# Patient Record
Sex: Male | Born: 1960 | Race: Black or African American | Hispanic: No | Marital: Married | Smoking: Never smoker
Health system: Southern US, Community
[De-identification: ages and names within clinical notes are randomized; demographics above are authoritative.]

## PROBLEM LIST (undated history)

## (undated) DIAGNOSIS — F22 Delusional disorders: Secondary | ICD-10-CM

## (undated) DIAGNOSIS — M549 Dorsalgia, unspecified: Secondary | ICD-10-CM

## (undated) DIAGNOSIS — J45909 Unspecified asthma, uncomplicated: Secondary | ICD-10-CM

## (undated) HISTORY — PX: CHOLECYSTECTOMY: SHX55

## (undated) MED ORDER — AMLODIPINE 5 MG TAB
5 mg | ORAL_TABLET | Freq: Every day | ORAL | Status: DC
Start: ? — End: 2013-07-25

## (undated) MED ORDER — ALBUTEROL SULFATE HFA 90 MCG/ACTUATION AEROSOL INHALER
90 mcg/actuation | RESPIRATORY_TRACT | Status: DC | PRN
Start: ? — End: 2013-07-25

## (undated) MED ORDER — FLUPHENAZINE 10 MG TAB
10 mg | ORAL_TABLET | Freq: Two times a day (BID) | ORAL | Status: DC
Start: ? — End: 2013-07-25

---

## 2006-06-02 ENCOUNTER — Ambulatory Visit (HOSPITAL_COMMUNITY): Admission: RE | Admit: 2006-06-02 | Discharge: 2006-06-02 | Payer: Self-pay | Admitting: Internal Medicine

## 2007-10-30 ENCOUNTER — Ambulatory Visit (HOSPITAL_COMMUNITY): Admission: RE | Admit: 2007-10-30 | Discharge: 2007-10-30 | Payer: Self-pay | Admitting: Internal Medicine

## 2007-12-25 ENCOUNTER — Ambulatory Visit: Payer: Self-pay | Admitting: Cardiology

## 2010-02-06 IMAGING — CT CT PELVIS W/O CM
2 of 4 series · 17 of 46 positions shown, 19 images · non-contrast
Comparison: None

CT ABDOMEN

CLINICAL DATA: Epigastric pain, elevated bilirubin, nausea

CT ABDOMEN AND PELVIS WITHOUT CONTRAST
TECHNIQUE: Multidetector CT imaging of the abdomen and pelvis was
performed following the standard
protocol without intravenous contrast.

[Series 3: routine abdomen · axial · 0.98mm/px · z∈[-524,-19]mm · 14 of 111 slices shown, 16 images]
[im 5/111  soft-tissue]
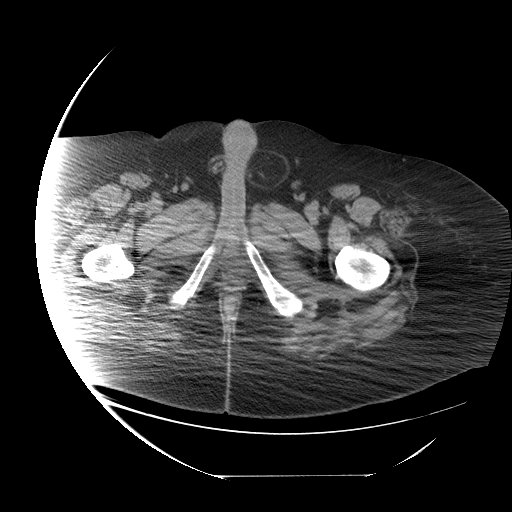
[im 5/111  bone]
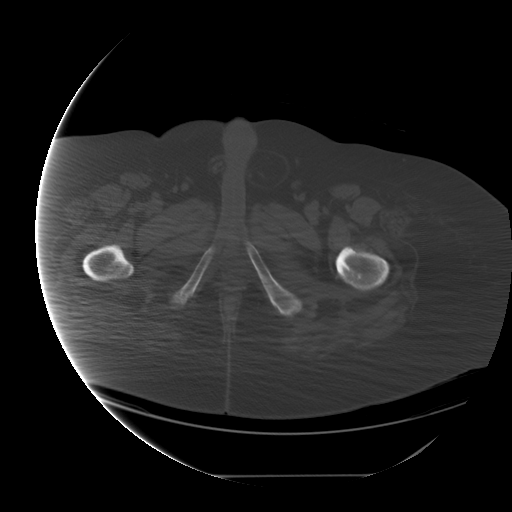
[im 13/111  soft-tissue]
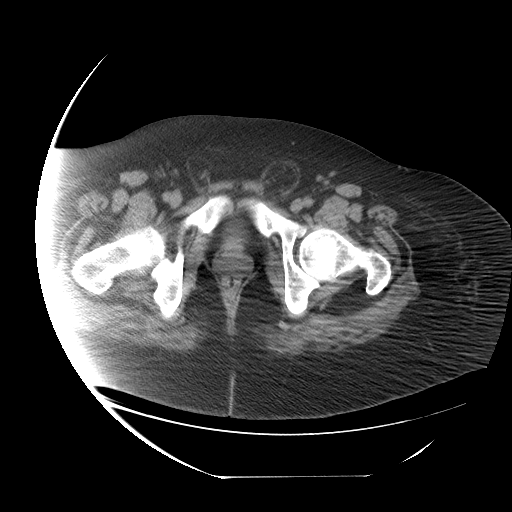
[im 22/111  soft-tissue]
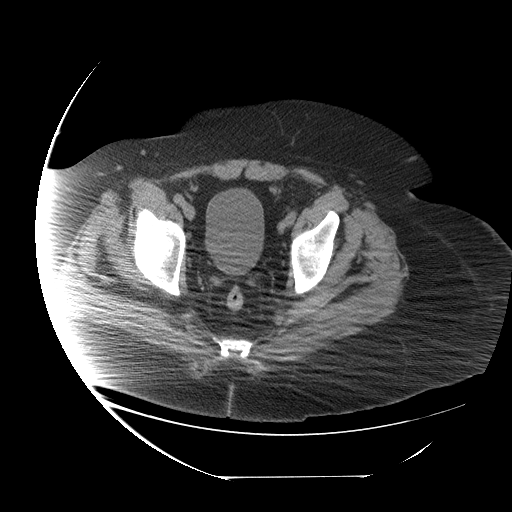
[im 30/111  soft-tissue]
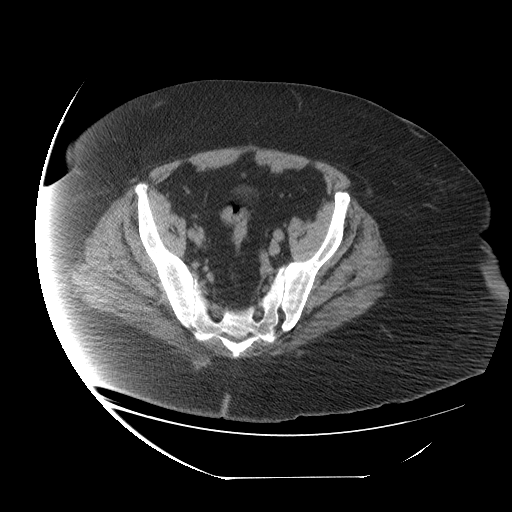
[im 39/111  soft-tissue]
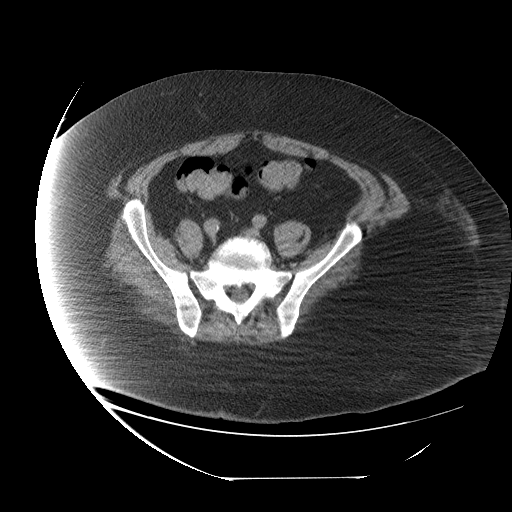
[im 43/111  soft-tissue]
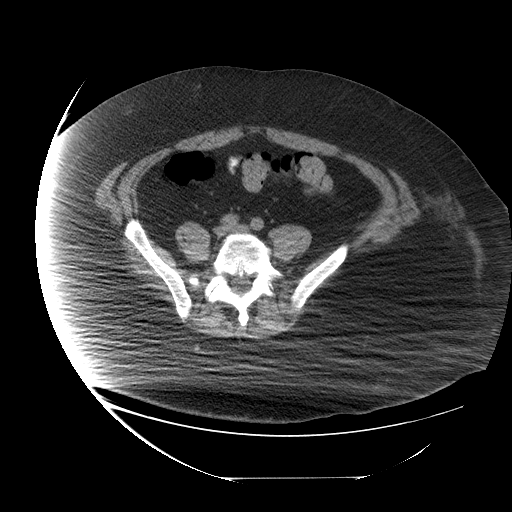
[im 51/111  soft-tissue]
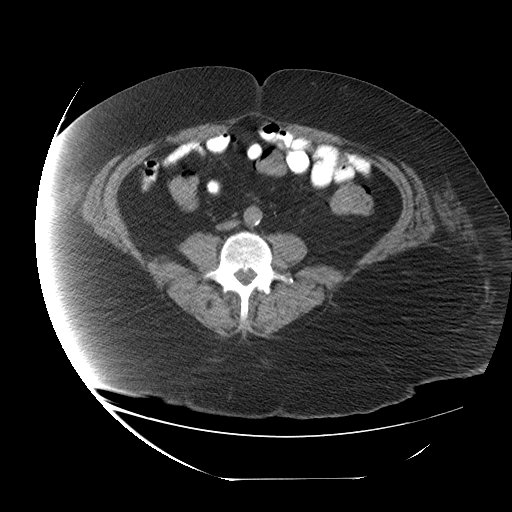
[im 60/111  soft-tissue]
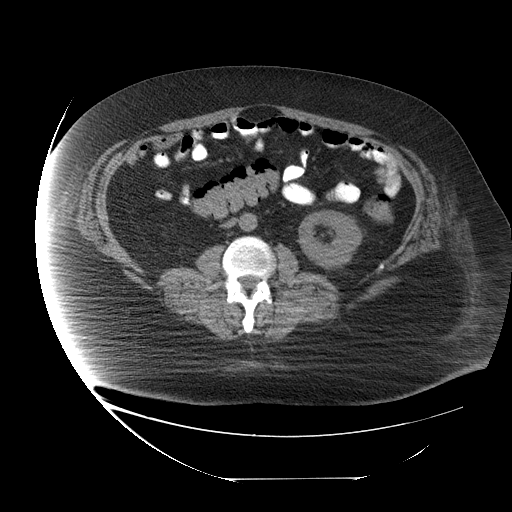
[im 68/111  soft-tissue]
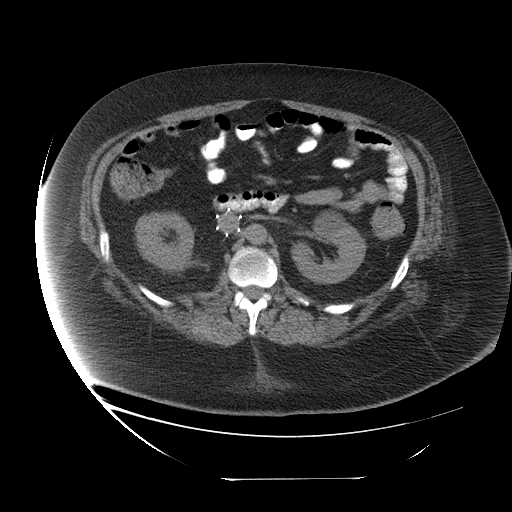
[im 68/111  bone]
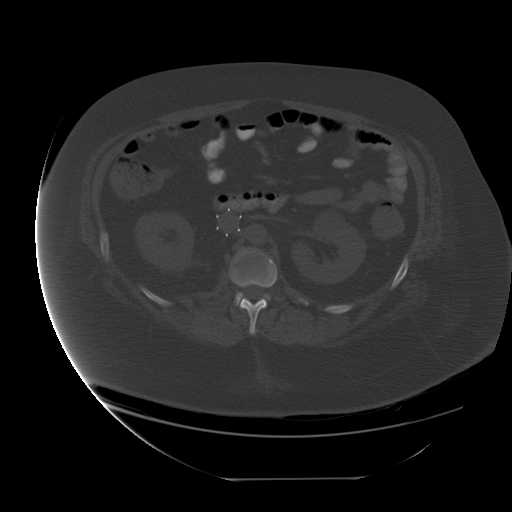
[im 72/111  soft-tissue]
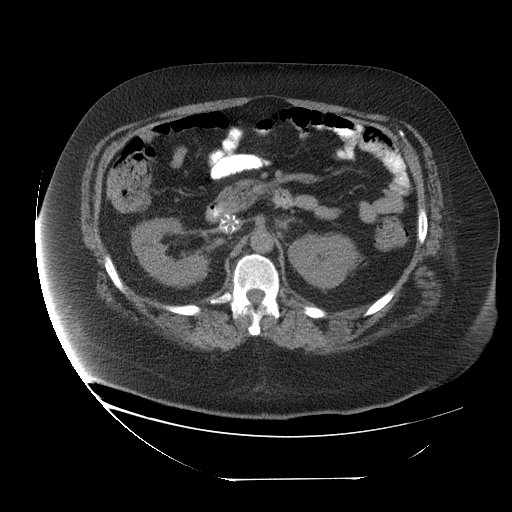
[im 81/111  soft-tissue]
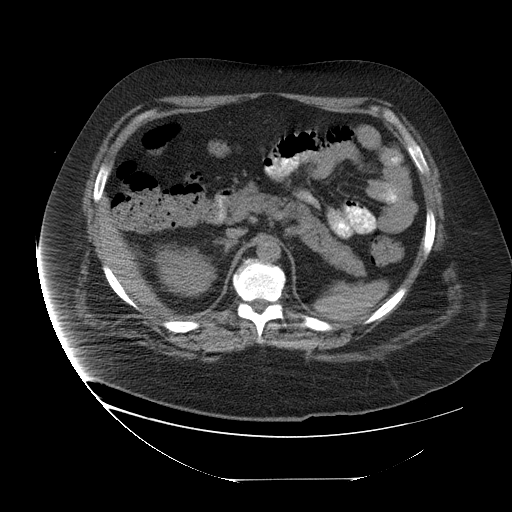
[im 89/111  soft-tissue]
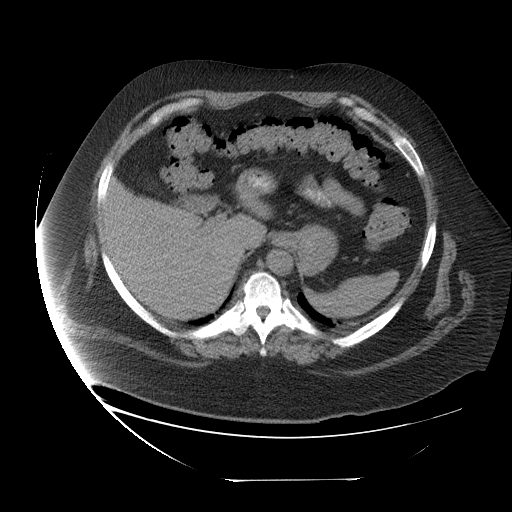
[im 98/111  soft-tissue]
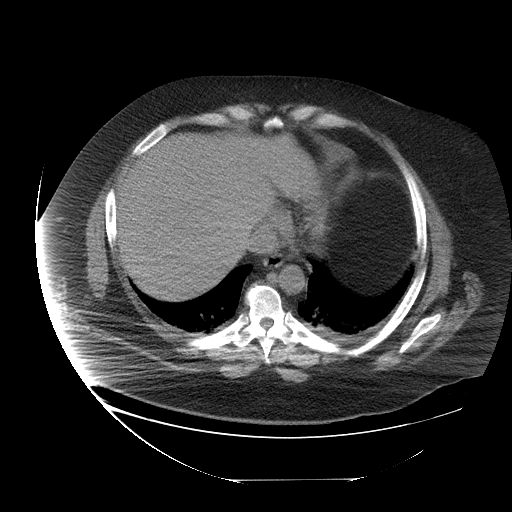
[im 106/111  soft-tissue]
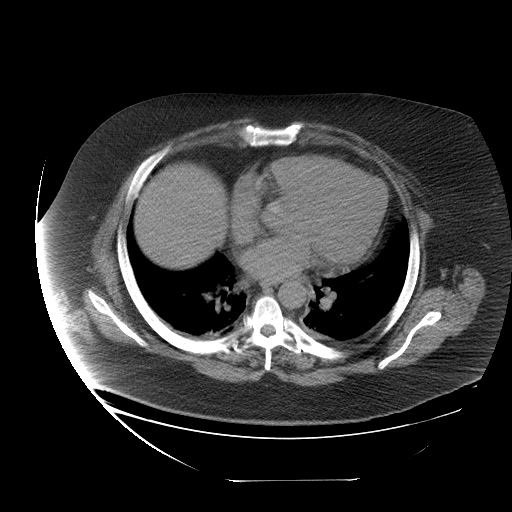

[Series 501: coronal · coronal · 0.90mm/px · 3 of 108 slices shown]
[im 36/108  soft-tissue]
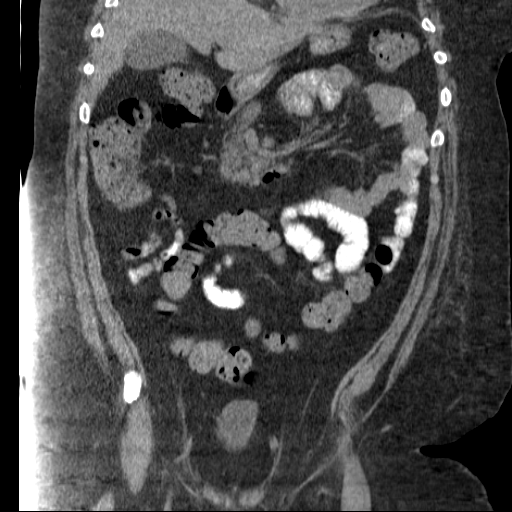
[im 48/108  soft-tissue]
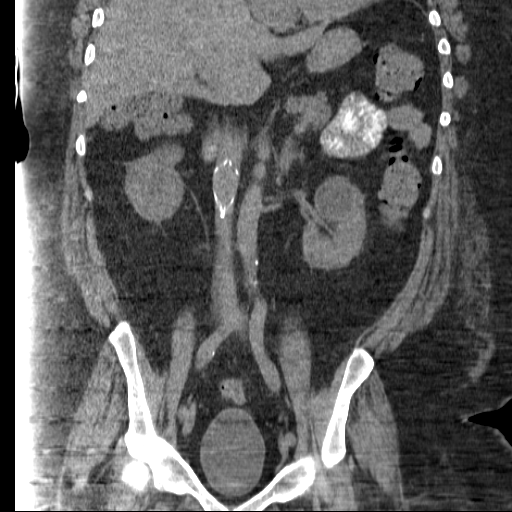
[im 60/108  soft-tissue]
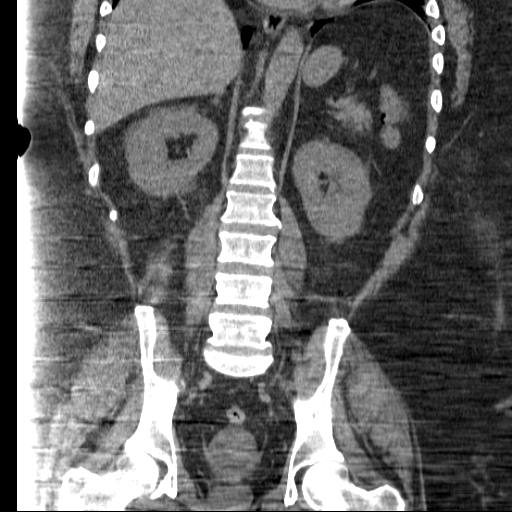

[17 of 46 positions shown; findings below may reference images not displayed]

FINDINGS: There is cardiomegaly.  Vascular congestion and
bibasilar atelectasis noted.

Liver unremarkable.  Gallbladder grossly unremarkable.  No biliary
ductal dilatation.  Pancreas, spleen, adrenals, right kidney have
an unremarkable unenhanced appearance.  3.6 cm low density area in
the mid pole of the left kidney.  This cannot be be characterized
without IV contrast.  IVC filter is in the infrarenal IVC.

Moderate stool throughout the colon.  Otherwise bowel grossly
unremarkable.  No free fluid, free air, or adenopathy.
IMPRESSION: No evidence of liver abnormality on this unenhanced scan.  No
biliary ductal dilatation or evidence of gallbladder disease.

3.6 cm low density lesion in the left kidney, cannot be
characterized without IV contrast.  This can be further evaluated
with ultrasound.

IVC filter in place.

Cardiomegaly with vascular congestion and bibasilar atelectasis.

CT PELVIS
FINDINGS: Bilateral inguinal hernias present, containing fat.
Bowel grossly unremarkable.  No free fluid, free air, or
adenopathy. Urinary bladder grossly unremarkable.

No acute bony abnormality.
IMPRESSION: No acute findings in the pelvis.

## 2010-05-22 ENCOUNTER — Encounter: Payer: Self-pay | Admitting: Internal Medicine

## 2012-10-19 ENCOUNTER — Inpatient Hospital Stay
Admit: 2012-10-19 | Discharge: 2012-10-29 | Disposition: A | Discharge status: Home / Self Care | Payer: Medicare (Managed Care) | Attending: Psychiatry | Admitting: Psychiatry

## 2012-10-19 DIAGNOSIS — F209 Schizophrenia, unspecified: Secondary | ICD-10-CM

## 2012-10-19 LAB — POC CHEM8
Anion gap (POC): 17 mmol/L — ABNORMAL HIGH (ref 5–15)
BUN (POC): 5 MG/DL — ABNORMAL LOW (ref 9–20)
CO2 (POC): 27 MMOL/L (ref 21–32)
Calcium, ionized (POC): 1.16 MMOL/L (ref 1.12–1.32)
Chloride (POC): 101 MMOL/L (ref 98–107)
Creatinine (POC): 0.8 MG/DL (ref 0.6–1.3)
GFRAA, POC: >60 mL/min/{1.73_m2} (ref >60)
GFRNA, POC: >60 mL/min/{1.73_m2} (ref >60)
Glucose (POC): 111 MG/DL — ABNORMAL HIGH (ref 75–110)
Hematocrit (POC): 44 % (ref 36.6–50.3)
Hemoglobin (POC): 15 GM/DL (ref 12.1–17.0)
Potassium (POC): 3.6 MMOL/L (ref 3.5–5.1)
Sodium (POC): 140 MMOL/L (ref 136–145)

## 2012-10-19 LAB — DRUG SCREEN, URINE
AMPHETAMINES: NEGATIVE
BARBITURATES: NEGATIVE
BENZODIAZEPINES: NEGATIVE
COCAINE: NEGATIVE
METHADONE: NEGATIVE
OPIATES: NEGATIVE
PCP(PHENCYCLIDINE): NEGATIVE
THC (TH-CANNABINOL): NEGATIVE

## 2012-10-19 LAB — ETHYL ALCOHOL: ALCOHOL(ETHYL),SERUM: NOT DETECTED MG/DL (ref <10)

## 2012-10-19 MED FILL — VENTOLIN HFA 90 MCG/ACTUATION AEROSOL INHALER: 90 mcg/actuation | RESPIRATORY_TRACT | Qty: 8

## 2012-10-19 NOTE — Behavioral Health Treatment Team (Signed)
GROUP THERAPY PROGRESS NOTE    Raliegh Brandi is participating in Community.     Group time: 30 minutes    Personal goal for participation: daily orientation    Goal orientation: personal    Group therapy participation: minimal    Therapeutic interventions reviewed and discussed: yes    Impression of participation: needed prompting

## 2012-10-19 NOTE — ED Notes (Signed)
TRANSFER - OUT REPORT:    Verbal report given to C. Leonie Man, RN on [employer]  being transferred to Psych for routine progression of care.      Report consisted of patient???s Situation, Background, Assessment and   Recommendations(SBAR).     Information from the following report(s) SBAR, ED Summary and Recent Results was reviewed with the receiving nurse.    Opportunity for questions and clarification was provided.      Patient's placed in a gown and his belongings placed in a bag.  Security made aware the patient is ready for transport to 311.  -patient transported to the floor in no apparent distress

## 2012-10-19 NOTE — H&P (Signed)
Patient seen, chart reviewed, staffing held and dictated report done. Please see dictated report for complete details.

## 2012-10-19 NOTE — Behavioral Health Treatment Team (Signed)
GROUP THERAPY PROGRESS NOTE    Antonio Potter is participating in Goals group.     Group time: 30 minutes    Personal goal for participation: to orient daily goal.    Goal orientation: personal    Group therapy participation: active    Therapeutic interventions reviewed and discussed: yes    Impression of participation: no problem
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
## 2012-10-19 NOTE — Progress Notes (Signed)
Problem: Altered Thought Process (Adult/Pediatric)  Goal: *STG: Remains safe in hospital  Outcome: Progressing Towards Goal  Patient is alert and oriented.  He has been out on the unit watching TV and interacting with peers.  Patient appears to be quite bizarre.  His mood: constricted.  His conversation is way off of topics and senseless.  He appears to be suspicious and guarded towards staff.  He denies SI/HI/AH.   He has refused to take blood pressure medications denying that he has high blood pressure because he "feel fine."  With much encouragement from staff he still refuses.  No prns given thus far.  Staff will continue to monitor patient Q15 minute checks.

## 2012-10-19 NOTE — Progress Notes (Signed)
Patient's blood pressure assessed again b/p 166/111.  Dr. Basilia Jumbo consulted blood pressure medications ordered.  Patient has refused to take any medications for blood pressure stating, "there's something wrong with the machine because I don't have any blood pressure problems."  Nursing advised patient of the dangers of patient's blood pressure reaching the height that it has reached. Patient still verbally refuses.  Dr. Basilia Jumbo made aware.

## 2012-10-19 NOTE — Consults (Signed)
Name:       Antonio Potter, Antonio Potter            Admitted:          10/19/2012                                         DOB:               [birth year]  Account #:  [id number]               Age:               52  Consultant: Fay Records, MD     Location                                CONSULTATION REPORT    DATE OF CONSULTATION           10/19/2012      REFERRING PHYSICIAN: Guilford Shi, MD.    REASON FOR CONSULTATION: Medical evaluation for psychiatric admission.    CHIEF COMPLAINT: Delusions.    HISTORY OF PRESENT ILLNESS: A 52 year old male presents delusional at a bus  stop. He denies any chest pain, shortness of breath, nausea, vomiting or  diarrhea. Denies any history of hypertension or headache.    PAST MEDICAL HISTORY: Back pain, asthma. Obese.    PAST SURGICAL HISTORY: None.    ALLERGIES: NONE.    MEDICATIONS: None.    SOCIAL HISTORY: Denies any tobacco, alcohol or illicit drug usage. Widowed,  has no kids, and gets disability.    PHYSICAL EXAMINATION  VITAL SIGNS: Temperature is 97.6, blood pressure 178/106, pulse 93,  respirations 16. Weight 350.  GENERAL: Pleasant, morbidly obese.  HEENT: Oropharynx is clear.  NECK: Supple. No lymphadenopathy.  LUNGS: Clear to auscultation. No wheezes, rales or rhonchi. No increased  work of breathing. No accessory muscle use.  CARDIOVASCULAR: Regular rate. No murmurs, gallops, or rubs.  ABDOMEN: Soft, nontender, nondistended, normoactive bowel sounds. No  hepatosplenomegaly.  EXTREMITIES: No cyanosis, clubbing, or edema.    LABORATORY DATA: Sodium 140, potassium 3.6, chloride 101, bicarbonate 27,  BUN is 5, creatinine 0.8, glucose 111. Hemoglobin is 15.0, hematocrit is  44. Toxicology screen is negative.    IMPRESSION: This is a 52 year old male with past medical history of  obesity, back disease and asthma, who presents with delusions and elevated  blood pressure, admitted for further psychiatric evaluation and treatment.      PLAN  1. Psychiatric management of mental  health issues.  2. Given the patient's elevated blood pressure, will start amlodipine.  3. Will give Proventil HFA p.r.n. for any wheezing or shortness of breath.  4. We will follow along during this hospitalization.  5. No VTE prophylaxis indicated or warranted.    Thank you for this consultation.        Reviewed on 10/19/2012 6:00 PM                Fay Records, MD    cc:                       Fay Records, MD      DLC/wmx; D: 10/19/2012 04:40 P; T: 10/19/2012 05:03 P; DOC# [id number]; Job#  [id number]

## 2012-10-19 NOTE — Behavioral Health Treatment Team (Signed)
Admission Note    Pt is a 51yo AAM here on temporary detention order due to unsafe to self or others. [place] [employer] [place]. He stated that demons were following him and people were following him. He denied any medical problem. He complained of bilateral knee pain (+6) His skin check showed a scar on the right side of the abdomen. He denied recent drug or alcohol use

## 2012-10-19 NOTE — Behavioral Health Treatment Team (Cosign Needed)
GROUP THERAPY PROGRESS NOTE    Antonio Potter is participating in Reflections.     Group time: 30 minutes    Personal goal for participation: to review daily goal    Goal orientation: personal    Group therapy participation: active    Therapeutic interventions reviewed and discussed: yes    Impression of participation;active

## 2012-10-19 NOTE — Consults (Signed)
Medical Consult for [facility] Patient    Consult H&P   dictated, see patient chart    Impression:    Antonio Potter a 52 y.o. male with past medical history of obesity, asthma, and back disease presents with behavioral health problems of delusions admitted for further psychiatric evaluation and treatment.    Plan:   1. Psychiatry to manage mental health issues  2. Will start Amlodipine for elevated bp  3. Proventil hfa prn sob/wheezing.  4. Medically stable at this time, will follow up as needed.  5. No VTE prophylaxis indicated or necessary at this time.     Thank you  Fay Records, MD  10/19/2012, 4:35 PM

## 2012-10-19 NOTE — ED Provider Notes (Addendum)
Patient is a 52 y.o. male presenting with mental health disorder. The history is provided by the patient and the police.   Mental Health Problem   This is a chronic problem. The current episode started more than 1 week ago. Progression since onset: brought by police as TDO. Associated symptoms include delusions. Pertinent negatives include no self-injury and no violence. Mental status baseline: somebody following him"         No past medical history on file.     No past surgical history on file.      No family history on file.     History     Social History   ??? Marital Status: N/A     Spouse Name: N/A     Number of Children: N/A   ??? Years of Education: N/A     Occupational History   ??? Not on file.     Social History Main Topics   ??? Smoking status: Not on file   ??? Smokeless tobacco: Not on file   ??? Alcohol Use: Not on file   ??? Drug Use: Not on file   ??? Sexually Active: Not on file     Other Topics Concern   ??? Not on file     Social History Narrative   ??? No narrative on file                  ALLERGIES: Review of patient's allergies indicates not on file.      Review of Systems   Psychiatric/Behavioral: Negative for suicidal ideas and self-injury. The patient is nervous/anxious.    All other systems reviewed and are negative.        There were no vitals filed for this visit.         Physical Exam   Nursing note and vitals reviewed.  Constitutional: He is oriented to person, place, and time. He appears well-developed and well-nourished.   HENT:   Head: Normocephalic and atraumatic.   Mouth/Throat: Oropharynx is clear and moist. No oropharyngeal exudate.   Eyes: Conjunctivae and EOM are normal. Pupils are equal, round, and reactive to light. Right eye exhibits no discharge. Left eye exhibits no discharge. No scleral icterus.   Neck: Normal range of motion. Neck supple. No tracheal deviation present.   Cardiovascular: Normal rate, regular rhythm, normal heart sounds and intact distal pulses.    No murmur heard.   Pulmonary/Chest: Effort normal and breath sounds normal. No respiratory distress. He has no wheezes. He has no rales.   Abdominal: Soft. Bowel sounds are normal. He exhibits no distension. There is no tenderness. There is no rebound and no guarding.   Musculoskeletal: Normal range of motion. He exhibits no edema and no tenderness.   Lymphadenopathy:     He has no cervical adenopathy.   Neurological: He is alert and oriented to person, place, and time.   Skin: Skin is warm. No rash noted. No erythema.   Psychiatric:   Denies hallucinations or suic. idea        MDM    Procedures

## 2012-10-20 NOTE — Behavioral Health Treatment Team (Signed)
Pt attend goals group.

## 2012-10-20 NOTE — Progress Notes (Signed)
Problem: Altered Thought Process (Adult/Pediatric)  Goal: *STG: Decreased delusional thinking  Outcome: Progressing Towards Goal  Pt is alert and oreinted, affect flat, mood depressed. Pt is paranoid denies delusions. Pt contracts for safety. Assess mood and behavior, assist to reality test. Monitor on q15' checks.

## 2012-10-20 NOTE — Behavioral Health Treatment Team (Signed)
Patient rested in bed with eyes closed for most part of the night, No distress or complains noted. Slept a total of 4 hours. Refused lab work this morning.  Will continue to monitor.

## 2012-10-20 NOTE — Behavioral Health Treatment Team (Signed)
Psychiatric Progress Note    Date: 10/20/2012  Account Number:  [id number]  Name: Antonio Potter      PSYCHOTHERAPY SESSION NOTE:  Length of psychotherapy session: none    Psychoeducation provided.   Treatment plan reviewed with patient and counseling provided---including diagnosis, treatment options and medications.     Supportive/Cognitive/Reality-Oriented psychotherapy provided in regards to various psychosocial stressors including:   pre-admission and current problems   Housing issues   Occupational issues   Academic issues   Legal issues   Medical issues   Interpersonal conflicts   Stress of hospitalization              Worked on issues of denial & effects of substance dependency/use    Extended energy and skill set needed to engage pt in psychotherapy due to the following: resistiveness of patient, complexity, resistance/negativity of patient, confrontational/hostile behaviors, and/or severe abnormalities in thought processes/psychosis resulting in the loss of expressive/receptive language communication skills.                                E & M PROGRESS NOTE:  To include the following:   A coordinated, multidisplinary treatment team round was conducted with the patient, nurses, pharmcist, Administrator present. Discussions held with case manager, and/or with family members;   Complete current electronic health record for patient was reviewed in full including consultant notes, ancillary staff notes, nurses and tech notes, labs and vitals.       SUBJECTIVE:   CC: " doing great, i dont need meds "    HPI/Interval History:   Antonio Potter reports the following psychiatric symptoms:  psychosis.  The symptoms have been present for years most likely and are of high severity. The symptoms occur daily.  Additional symptoms include agitation. Current symptoms are likely precipitated by tx non compliance.      Review of Systems:  Appetite:no change from normal   Sleep: improved   Pt reports feeling well, no  fever/fatigue  Pt denies GI symptoms such as N/V; Pt denies dizziness/HA    Side Effects:  None reported or admitted to.      Past Medical History:  Active Ambulatory Problems     Diagnosis Date Noted   ??? Altered thought processes 10/19/2012     Resolved Ambulatory Problems     Diagnosis Date Noted   ??? No Resolved Ambulatory Problems     No Additional Past Medical History     Past medical history has been reviewed (see dictated report) with no additional updates (I asked patient and no additional past medical history provided).    Social History:     Social history has been reviewed (see dictated report) with no additional updates (I asked patient and no additional social history provided).    Family History:  Family history has been reviewed (see dictated report) with no additional updates (I asked patient and no additional family history provided).    OBJECTIVE:                 MENTAL STATUS EXAM:   FINDINGS WITHIN NORMAL LIMITS (WNL) UNLESS OTHERWISE STATED BELOW:    Orientation not oriented to situation, oriented to time, place and person   Vital Signs See below (reviewed)   Gait Within normal limits   Abnormal Muscular Movements/Tone/Behavior No EPS, no evidence of TD, restless and within normal limits   Relations guarded   General Appearance:  age appropriate, casually dressed, disheveled, overweight and intimidating at times   Speech/Language:  loud and pressured   Thought Process: goal directed and loose associations   Thought Content delusions and hallucinations   Suicidal Ideations no intention   Homicidal Ideations no intention   Mood:  irritable   Affect:  irritable   Memory recent  adequate   Memory remote:  adequate   Concentration/Attention:  adequate   Fund of Knowledge Fair/average   Insight:  poor   Reliability poor   Judgment:  poor     Pertinent data:  No data found.    No results found for this or any previous visit (from the past 24 hour(s)).    Medications:  Current Facility-Administered  Medications   Medication Dose Route Frequency   ??? [START ON 10/21/2012] ARIPiprazole (ABILIFY) tablet 5 mg  5 mg Oral DAILY   ??? ziprasidone (GEODON) 20 mg in sterile water (preservative free) injection  20 mg IntraMUSCular BID PRN   ??? OLANZapine (ZYPREXA) tablet 5 mg  5 mg Oral Q6H PRN   ??? benztropine (COGENTIN) tablet 2 mg  2 mg Oral BID PRN   ??? benztropine (COGENTIN) injection 2 mg  2 mg IntraMUSCular Q12H PRN   ??? LORazepam (ATIVAN) injection 2 mg  2 mg IntraMUSCular Q4H PRN   ??? LORazepam (ATIVAN) tablet 1 mg  1 mg Oral Q4H PRN   ??? zolpidem (AMBIEN) tablet 10 mg  10 mg Oral QHS PRN   ??? acetaminophen (TYLENOL) tablet 650 mg  650 mg Oral Q4H PRN   ??? ibuprofen (MOTRIN) tablet 400 mg  400 mg Oral Q8H PRN   ??? magnesium hydroxide (MILK OF MAGNESIA) oral suspension 30 mL  30 mL Oral DAILY PRN   ??? nicotine (NICODERM CQ) 21 mg/24 hr patch 1 Patch  1 Patch TransDERmal DAILY PRN   ??? amLODIPine (NORVASC) tablet 5 mg  5 mg Oral DAILY   ??? albuterol (PROVENTIL HFA, VENTOLIN HFA) inhaler 2 Puff  2 Puff Inhalation Q4H RT       Allergies:  No Known Allergies    Scheduled Medications:  Current Facility-Administered Medications   Medication Dose Route Frequency   ??? [START ON 10/21/2012] ARIPiprazole (ABILIFY) tablet 5 mg  5 mg Oral DAILY   ??? amLODIPine (NORVASC) tablet 5 mg  5 mg Oral DAILY   ??? albuterol (PROVENTIL HFA, VENTOLIN HFA) inhaler 2 Puff  2 Puff Inhalation Q4H RT         ASSESSMENT/PLAN:     Diagnoses:  Patient Active Hospital Problem List:   Psychosis (10/19/2012)    Assessment: Patient is still with a severe exacerbation of condition which is worsening, not improving. Pt symptoms seem to be chronic, pt denies psych hx, need collateral, consider organic wu if no records found    Plan: organic wu, SGA, titrate as needed, court ordered meds if refuses         The following regarding medications was addressed during rounds with patient:   the risks and benefits of the proposed medication. The patient was given the opportunity  to ask questions. Informed consent given to the use of the above medications.    Will continue to adjust psychiatric and non-psychiatric medications   (see above "medication" section for details) as deemed appropriate &  based upon diagnoses and response to treatment.     Will continue to order blood tests/labs and diagnostic tests as deemed appropriate and review results as they become available.  Will order old records and review once available as deemed appropriate.    Will gather additional collateral information from friends, family and o/p treatment team as deemed appropriate.    Will continue to provide individual, milieu, occupational, group, and   substance abuse therapies to address target symptoms as deemed   appropriate for the individual patient.    EXPECTED DISCHARGE DATE (Day): TBD    DISPOSITION: Home    Signed By: Cyndra Numbers, NP

## 2012-10-20 NOTE — Behavioral Health Treatment Team (Signed)
Social Work Psychosocial Assessment     Pt is a 52 year old male who was admitted after exhibiting psychotic behavior at the [employer] Pt is originally from [facility] and was staying with his cousin in [place]. Pt decided to relocate to [facility] to "get away from some people who are chasing him and trying to kill him. Demons are after me". Per admission note, pt is religiously preoccupied. Pt states he is neither going to [place] or [place] at discharge but would like to be referred to a shelter in [place] and hopefully get assistance with housing. Pt says he is on [place] disability and has Medicare.     Pt is oriented x 3, paranoid, and religiously preoccupied. Pt denies auditory and visual hallucinations. Pt has pressured speech. Pt is anxious regarding his discharge plan.     Pt has a history of substance abuse but has not used crack in 20 years. Pt has not had alcohol in 3 years. There are no legal charges, Pt graduated from high school.     Pt will be referred to a shelter and [facility] for treatment.     S. Rysinski, LCSW

## 2012-10-20 NOTE — Behavioral Health Treatment Team (Signed)
GROUP THERAPY PROGRESS NOTE    Antonio Potter is participating in [place].     Group time: 30 minutes    Personal goal for participation: reality orientation    Goal orientation: social    Group therapy participation: minimal    Therapeutic interventions reviewed and discussed: yes    Impression of participation: no problem.

## 2012-10-21 NOTE — Behavioral Health Treatment Team (Signed)
Pt rested in bed with eyes closed for approximately 7 hours. No reports made by pt of having any difficulty sleeping while staff conducted rounds. Respirations even and unlabored. No acute distress noted. Was maintained on q15min checks. Will continue to monitor.

## 2012-10-21 NOTE — Behavioral Health Treatment Team (Cosign Needed)
Pt is visible on the unit.  Pt is meal and med compliant.  Pt attend groups.  Pt interacted appropriately with peers and staff.  Pt denies S/H ideations and A/V hallucinations at this time.  Pt remains on Q- 15 minute checks for safety, will continue monitor pt and offer support when needed.

## 2012-10-21 NOTE — Behavioral Health Treatment Team (Cosign Needed)
Pt did not attend creative expression group.

## 2012-10-21 NOTE — Behavioral Health Treatment Team (Cosign Needed)
GROUP THERAPY PROGRESS NOTE    Antonio Potter is participating in reflections.     Group time: 30 minutes    Personal goal for participation: to reflect on daily goal    Goal orientation: personal    Group therapy participation: active    Therapeutic interventions reviewed and discussed: yes    Impression of participation: good

## 2012-10-21 NOTE — Behavioral Health Treatment Team (Signed)
Pt didn;t attend goals group.

## 2012-10-21 NOTE — Progress Notes (Signed)
GROUP THERAPY PROGRESS NOTE    Antonio Potter is participating in Recreation group.     Group time: 30 minutes    Personal goal for participation: to review relaxation group; coping skills    Goal orientation: relaxation    Group therapy participation: active    Therapeutic interventions reviewed and discussed: yes    Impression of participation: good; verbalized understanding

## 2012-10-21 NOTE — Behavioral Health Treatment Team (Signed)
Spoke with Dr Nolon Rod NP, updated cmed granted per court hearing team. States will put in court ordered meds.

## 2012-10-21 NOTE — Behavioral Health Treatment Team (Signed)
Psychiatric Progress Note    Date: 10/21/2012  Account Number:  [id number]  Name: Antonio Potter      PSYCHOTHERAPY SESSION NOTE:  Length of psychotherapy session: 20 minutes    Psychoeducation provided.   Treatment plan reviewed with patient and counseling provided---including diagnosis, treatment options and medications.     Supportive/Cognitive/Reality-Oriented psychotherapy provided in regards to various psychosocial stressors including:   pre-admission and current problems   Housing issues   Occupational issues   Academic issues   Legal issues   Medical issues   Interpersonal conflicts   Stress of hospitalization              Worked on issues of denial & effects of substance dependency/use    Extended energy and skill set needed to engage pt in psychotherapy due to the following: resistiveness of patient, complexity, resistance/negativity of patient, confrontational/hostile behaviors, and/or severe abnormalities in thought processes/psychosis resulting in the loss of expressive/receptive language communication skills.      Pt is progressing in therapeutic modalites. Pt still guarded and denies mental illness but cooperative.                           E & M PROGRESS NOTE:  To include the following:   A coordinated, multidisplinary treatment team round was conducted with the patient, nurses, pharmcist, Administrator present. Discussions held with case manager, and/or with family members;   Complete current electronic health record for patient was reviewed in full including consultant notes, ancillary staff notes, nurses and tech notes, labs and vitals.       SUBJECTIVE:   CC: " doing great, i dont have mental illness and I dont need medications "    HPI/Interval History:   Antonio Potter reports the following psychiatric symptoms:  psychosis.  The symptoms have been present for years most likely and are of high severity. The symptoms occur daily.  Additional symptoms include agitation. Current symptoms are  likely precipitated by tx non compliance.  Pt has no insight into mental health symptoms.    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      Past Medical History:  Active Ambulatory Problems     Diagnosis Date Noted   ??? Altered thought processes 10/19/2012     Resolved Ambulatory Problems     Diagnosis Date Noted   ??? No Resolved Ambulatory Problems     No Additional Past Medical History     Past medical history has been reviewed (see dictated report) with no additional updates (I asked patient and no additional past medical history provided).    Social History:     Social history has been reviewed (see dictated report) with no additional updates. Pt wants to relocate to [place]. Asking about housing/shelters.    Family History:  Family history has been reviewed (see dictated report) with no additional updates (I asked patient and no additional family history provided).    OBJECTIVE:                 MENTAL STATUS EXAM:   FINDINGS WITHIN NORMAL LIMITS (WNL) UNLESS OTHERWISE STATED BELOW:    Orientation not oriented to  situation, oriented to time, place and person   Vital Signs See below (reviewed)   Gait Within normal limits   Abnormal Muscular Movements/Tone/Behavior No EPS, no evidence of TD, restless and within normal limits   Relations guarded, cooperative   General Appearance:  age appropriate, casually dressed, disheveled, overweight and intimidating at times   Speech/Language:  loud and pressured   Thought Process: goal directed and loose associations   Thought Content delusions and  ? Hallucinations-pt denies   Suicidal Ideations no intention   Homicidal Ideations no intention   Mood:  wnl   Affect:  Irritable/wnl   Memory recent   adequate   Memory remote:  adequate   Concentration/Attention:  adequate   Fund of Knowledge Fair/average   Insight:  poor   Reliability poor   Judgment:  poor     Pertinent data:  Patient Vitals for the past 8 hrs:   BP Temp Pulse Resp   10/21/12 1210 128/74 mmHg 97.6 ??F (36.4 ??C) 88 18   10/21/12 0605 138/81 mmHg 97.1 ??F (36.2 ??C) 86 16     No results found for this or any previous visit (from the past 24 hour(s)).    Medications:  Current Facility-Administered Medications   Medication Dose Route Frequency   ??? ARIPiprazole (ABILIFY) tablet 5 mg  5 mg Oral DAILY   ??? ziprasidone (GEODON) 20 mg in sterile water (preservative free) injection  20 mg IntraMUSCular BID PRN   ??? OLANZapine (ZYPREXA) tablet 5 mg  5 mg Oral Q6H PRN   ??? benztropine (COGENTIN) tablet 2 mg  2 mg Oral BID PRN   ??? benztropine (COGENTIN) injection 2 mg  2 mg IntraMUSCular Q12H PRN   ??? LORazepam (ATIVAN) injection 2 mg  2 mg IntraMUSCular Q4H PRN   ??? LORazepam (ATIVAN) tablet 1 mg  1 mg Oral Q4H PRN   ??? zolpidem (AMBIEN) tablet 10 mg  10 mg Oral QHS PRN   ??? acetaminophen (TYLENOL) tablet 650 mg  650 mg Oral Q4H PRN   ??? ibuprofen (MOTRIN) tablet 400 mg  400 mg Oral Q8H PRN   ??? magnesium hydroxide (MILK OF MAGNESIA) oral suspension 30 mL  30 mL Oral DAILY PRN   ??? nicotine (NICODERM CQ) 21 mg/24 hr patch 1 Patch  1 Patch TransDERmal DAILY PRN   ??? amLODIPine (NORVASC) tablet 5 mg  5 mg Oral DAILY   ??? albuterol (PROVENTIL HFA, VENTOLIN HFA) inhaler 2 Puff  2 Puff Inhalation Q4H RT       Allergies:  No Known Allergies    Scheduled Medications:  Current Facility-Administered Medications   Medication Dose Route Frequency   ??? ARIPiprazole (ABILIFY) tablet 5 mg  5 mg Oral DAILY   ??? amLODIPine (NORVASC) tablet 5 mg  5 mg Oral DAILY   ??? albuterol (PROVENTIL HFA, VENTOLIN HFA) inhaler 2 Puff  2 Puff Inhalation Q4H RT         ASSESSMENT/PLAN:     Diagnoses:  Patient Active Hospital Problem List:   Psychosis (10/19/2012)    Assessment: Patient is still with a  severe exacerbation of condition which is worsening, not improving. Pt symptoms seem to be chronic, pt denies psych hx, need collateral, consider organic wu if no records found, pt presents with symptoms of schizophrenia vs schizoaffective disorder, will schedule antipsychotic, if refuses will submit court ordered med petition    Plan: past records if available, organic wu, SGA, titrate as needed, court ordered meds if refuses  The following regarding medications was addressed during rounds with patient:   the risks and benefits of the proposed medication. The patient was given the opportunity to ask questions. Informed consent given to the use of the above medications.    Will continue to adjust psychiatric and non-psychiatric medications   (see above "medication" section for details) as deemed appropriate &  based upon diagnoses and response to treatment.     Will continue to order blood tests/labs and diagnostic tests as deemed appropriate and review results as they become available.    Will order old records and review once available as deemed appropriate.    Will gather additional collateral information from friends, family and o/p treatment team as deemed appropriate.    Will continue to provide individual, milieu, occupational, group, and   substance abuse therapies to address target symptoms as deemed   appropriate for the individual patient.    EXPECTED DISCHARGE DATE (Day): TBD    DISPOSITION: Home    Signed By: Cyndra Numbers, NP

## 2012-10-21 NOTE — Behavioral Health Treatment Team (Signed)
Patient did not participate in community meeting

## 2012-10-21 NOTE — Progress Notes (Signed)
Problem: Altered Thought Process (Adult/Pediatric)  Goal: *STG: Participates in treatment plan  Outcome: Progressing Towards Goal  Patient attended treatment team and participated. States he is not interested in taking medications and does not feel he needs them. Patient denies hx of mental illness and is not interested in an organic workup as suggested by Dr Nolon Rod. He is extremely pleasant and affect bright, mood pleasant. Patient states "I feel great". Noted old scar midline of neck, patient states hx of tracheostomy. Noted when talking can hear air coming from area and patient states it is not completely closed. States sometimes mucus may come from area. When talking about old trach area he related it to being obese and having problems breathing that led to him needing a trach. States later he pulled it out and has not needed it since. Noted voice tone is clear and no respiratory distress noted. States hx gallbladder problems,  pulmonary embolism and asthma.  Patient denies hallucinations and no self harm behaviors noted. No agitation noted. Awaiting outcome of TDO hearing, will continue checks, be therapeutic listening and anticipate needs.

## 2012-10-21 NOTE — Behavioral Health Treatment Team (Signed)
Patient presents as pleasant on approach, does not think he has a mental illness. Out in the dayroom at intervals. No behavioral issues noted. Denies hallucinations. No self harm behaviors. Continue checks and encourage med compliance.

## 2012-10-21 NOTE — Behavioral Health Treatment Team (Signed)
Pt didn't attend Medication group.

## 2012-10-21 NOTE — Behavioral Health Treatment Team (Signed)
The patient Antonio Potter a 52 y.o. male has been visible on the unit.  He denies suicidal and homicidal Ideation and verbally contracted for safety. Patient denies audio / visual hallucination. Patient appears to be responding to internal stimuli. Affect is angry. Patient interact minimally with peers. Refused some of his medication tonight. patient appear paranoid and suspicious. Will continue to monitor.

## 2012-10-22 MED ADMIN — fluPHENAZine (PROLIXIN) injection 5 mg +++COURT ORDERED MEDICATION FOR REFUSAL OF PO PROLIXIN+++: INTRAMUSCULAR | @ 18:00:00 | NDC 63323028110

## 2012-10-22 MED ADMIN — fluPHENAZine (PROLIXIN) 5mg/mL solution 5 mg: ORAL | @ 22:00:00 | NDC 00121065304

## 2012-10-22 MED FILL — FLUPHENAZINE 5 MG/ML ORAL CONCENTRATE: 5 mg/mL | ORAL | Qty: 1

## 2012-10-22 MED FILL — FLUPHENAZINE HCL 2.5 MG/ML IJ SOLN: 2.5 mg/mL | INTRAMUSCULAR | Qty: 10

## 2012-10-22 NOTE — Progress Notes (Signed)
Patient walked up to me and stated, "you are a very beautiful woman, pretty eyes, I would date you but I wouldn't marry you."  Patient was redirected again for making such statements.

## 2012-10-22 NOTE — Behavioral Health Treatment Team (Signed)
Psychiatric Progress Note    Date: 10/22/2012  Account Number:  [id number]  Name: Antonio Potter      PSYCHOTHERAPY SESSION NOTE:  Length of psychotherapy session: 25 minutes    Psychoeducation provided.   Treatment plan reviewed with patient and counseling provided---including diagnosis, treatment options and medications.     Supportive/Cognitive/Reality-Oriented psychotherapy provided in regards to various psychosocial stressors including:   pre-admission and current problems   Housing issues   Occupational issues   Academic issues   Legal issues   Medical issues   Interpersonal conflicts   Stress of hospitalization              Worked on issues of denial & effects of substance dependency/use    Extended energy and skill set needed to engage pt in psychotherapy due to the following: resistiveness of patient, complexity, resistance/negativity of patient, confrontational/hostile behaviors, and/or severe abnormalities in thought processes/psychosis resulting in the loss of expressive/receptive language communication skills.      Pt is not progressing in therapeutic modalites. Pt still guarded and denies mental illness, threatening today and refusing medications. Left tx rounds and then returned refusing again.                  E & M PROGRESS NOTE:  To include the following:   A coordinated, multidisplinary treatment team round was conducted with the patient, nurses, pharmcist, Administrator present. Discussions held with case manager, and/or with family members;   Complete current electronic health record for patient was reviewed in full including consultant notes, ancillary staff notes, nurses and tech notes, labs and vitals.       SUBJECTIVE:   CC: " i dont have mental illness and I dont need medications. im not taking them "    HPI/Interval History:   Bartow reports the following psychiatric symptoms:  psychosis.  The symptoms have been present for years most likely and are of high severity. The symptoms  occur daily.  Additional symptoms include agitation. Current symptoms are likely precipitated by tx non compliance.  Pt has no insight into mental health symptoms.    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      Past Medical History:  Active Ambulatory Problems     Diagnosis Date Noted   ??? Altered thought processes 10/19/2012     Resolved Ambulatory Problems     Diagnosis Date Noted   ??? No Resolved Ambulatory Problems     No Additional Past Medical History     Past medical history has been reviewed (see dictated report) with no additional updates (I asked patient and no additional past medical history provided).    Social History:     Social history has been reviewed (see dictated report) with no additional updates. Pt wants to relocate to [place]. Asking about housing/shelters.    Family History:  Family history has been reviewed (see dictated report) with no additional updates (I asked patient and no additional family history provided).    OBJECTIVE:                 MENTAL STATUS EXAM:   FINDINGS WITHIN NORMAL LIMITS (WNL) UNLESS OTHERWISE STATED BELOW:  Orientation not oriented to situation, oriented to time, place and person   Vital Signs See below (reviewed)   Gait Within normal limits   Abnormal Muscular Movements/Tone/Behavior No EPS, no evidence of TD, restless and within normal limits   Relations guarded, argumentative   General Appearance:  age appropriate, casually dressed, disheveled, overweight and intimidating at times   Speech/Language:  loud and pressured   Thought Process: goal directed and loose associations   Thought Content delusions and  ? Hallucinations-pt denies   Suicidal Ideations no intention   Homicidal  Ideations no intention   Mood:  Irritable, agitated   Affect:  Irritable/agitated   Memory recent  adequate   Memory remote:  adequate   Concentration/Attention:  adequate   Fund of Knowledge Fair/average   Insight:  poor   Reliability poor   Judgment:  poor     Pertinent data:  No data found.    No results found for this or any previous visit (from the past 24 hour(s)).    Medications:  Current Facility-Administered Medications   Medication Dose Route Frequency   ??? fluPHENAZine (PROLIXIN) injection 5 mg +++COURT ORDERED MEDICATION FOR REFUSAL OF PO PROLIXIN+++  5 mg IntraMUSCular BID   ??? fluPHENAZine (PROLIXIN) 5mg /mL solution 5 mg  5 mg Oral BID   ??? [DISCONTINUED] fluPHENAZine (PROLIXIN) 5mg /mL solution 2.5 mg  2.5 mg Oral BID   ??? [DISCONTINUED] ARIPiprazole (ABILIFY) tablet 5 mg  5 mg Oral DAILY   ??? ziprasidone (GEODON) 20 mg in sterile water (preservative free) injection  20 mg IntraMUSCular BID PRN   ??? OLANZapine (ZYPREXA) tablet 5 mg  5 mg Oral Q6H PRN   ??? benztropine (COGENTIN) tablet 2 mg  2 mg Oral BID PRN   ??? benztropine (COGENTIN) injection 2 mg  2 mg IntraMUSCular Q12H PRN   ??? LORazepam (ATIVAN) injection 2 mg  2 mg IntraMUSCular Q4H PRN   ??? LORazepam (ATIVAN) tablet 1 mg  1 mg Oral Q4H PRN   ??? zolpidem (AMBIEN) tablet 10 mg  10 mg Oral QHS PRN   ??? acetaminophen (TYLENOL) tablet 650 mg  650 mg Oral Q4H PRN   ??? ibuprofen (MOTRIN) tablet 400 mg  400 mg Oral Q8H PRN   ??? magnesium hydroxide (MILK OF MAGNESIA) oral suspension 30 mL  30 mL Oral DAILY PRN   ??? nicotine (NICODERM CQ) 21 mg/24 hr patch 1 Patch  1 Patch TransDERmal DAILY PRN   ??? amLODIPine (NORVASC) tablet 5 mg  5 mg Oral DAILY   ??? albuterol (PROVENTIL HFA, VENTOLIN HFA) inhaler 2 Puff  2 Puff Inhalation Q4H RT       Allergies:  No Known Allergies    Scheduled Medications:  Current Facility-Administered Medications   Medication Dose Route Frequency   ??? fluPHENAZine (PROLIXIN) injection 5 mg +++COURT ORDERED MEDICATION FOR REFUSAL OF PO PROLIXIN+++   5 mg IntraMUSCular BID   ??? fluPHENAZine (PROLIXIN) 5mg /mL solution 5 mg  5 mg Oral BID   ??? [DISCONTINUED] fluPHENAZine (PROLIXIN) 5mg /mL solution 2.5 mg  2.5 mg Oral BID   ??? [DISCONTINUED] ARIPiprazole (ABILIFY) tablet 5 mg  5 mg Oral DAILY   ??? amLODIPine (NORVASC) tablet 5 mg  5 mg Oral DAILY   ??? albuterol (PROVENTIL HFA, VENTOLIN HFA) inhaler 2 Puff  2 Puff Inhalation Q4H RT         ASSESSMENT/PLAN:     Diagnoses:  Patient Active Hospital Problem List:   Psychosis (10/19/2012)    Assessment: Patient is  still with a severe exacerbation of condition which is worsening, not improving. Pt symptoms seem to be chronic, pt denies psych hx, need collateral, consider organic wu if no records found, pt presents with symptoms of schizophrenia vs schizoaffective disorder, will schedule antipsychotic, if refuses will submit court ordered med petition--court ordered meds approved    Plan: past records if available, will initiate organic w/u as no past psych hx noted yet, start antipsychotic and titrate as needed, court ordered meds if refuses         The following regarding medications was addressed during rounds with patient:   the risks and benefits of the proposed medication. The patient was given the opportunity to ask questions. Informed consent given to the use of the above medications.    Will continue to adjust psychiatric and non-psychiatric medications   (see above "medication" section for details) as deemed appropriate &  based upon diagnoses and response to treatment.     Will continue to order blood tests/labs and diagnostic tests as deemed appropriate and review results as they become available.    Will order old records and review once available as deemed appropriate.    Will gather additional collateral information from friends, family and o/p treatment team as deemed appropriate.    Will continue to provide individual, milieu, occupational, group, and   substance abuse therapies to address target symptoms as  deemed   appropriate for the individual patient.    EXPECTED DISCHARGE DATE (Day): TBD    DISPOSITION: Home    Signed By: Cyndra Numbers, NP

## 2012-10-22 NOTE — Behavioral Health Treatment Team (Cosign Needed)
GROUP THERAPY PROGRESS NOTE    The patient Antonio Potter a 52 y.o. male is participating in Reflections Group    Group time: 30 minutes    Personal goal for participation: To discuss the daily goals    Goal orientation: personal    Group therapy participation: passive    Therapeutic interventions reviewed and discussed:  Yes    Impression of participation: fair    Darrel Hoover  10/22/2012  10:12 PM

## 2012-10-22 NOTE — Behavioral Health Treatment Team (Signed)
GROUP THERAPY PROGRESS NOTE    Antonio Potter is participating in [place].     Group time: 30 minutes    Personal goal for participation: daily orientation    Goal orientation: personal    Group therapy participation: minimal    Therapeutic interventions reviewed and discussed: yes    Impression of participation: needed prompting

## 2012-10-22 NOTE — Progress Notes (Signed)
Patient just walked up on staff and stated, "don't you know that you are sexy."

## 2012-10-22 NOTE — Behavioral Health Treatment Team (Cosign Needed)
Pt did not attend coping skills group.

## 2012-10-22 NOTE — Behavioral Health Treatment Team (Cosign Needed)
A&O.  Pt.'s personal hygiene skills and grooming habits are poor; dishelved.  Encouraged to shower; refused.  During this shift this pt. have been in the Dayroom in intervals to have needs met.  This pt. presents as being guarded, preoccupied, and argumentative.  Pt.'s speech is loud and pressured.  Pt.'s insight and judgement regarding mental illness is poor.  Affect/Mood:  Irritable and Labile.  Pt. denies A/V hallucinations and S/H ideations.  Educates on self care and encourages handling of ADL's.  Offers support and anticipates needs.  Q-15 minute checks maintained for safety per Hospital protocol.

## 2012-10-22 NOTE — Behavioral Health Treatment Team (Signed)
Pt refused 12 noon meds. Staff approached pt and pt stated i aint taking no meds by mouth or by injection.  Staff informed pt about the court order.  Security and support was called to assist.  Pt received Court order meds per order.  Will continue to monitor.

## 2012-10-22 NOTE — Behavioral Health Treatment Team (Cosign Needed)
Pt did not attend creative expression group.

## 2012-10-22 NOTE — Progress Notes (Addendum)
Problem: Altered Thought Process (Adult/Pediatric)  Goal: *STG: Remains safe in hospital  Outcome: Progressing Towards Goal  Patient rested in bed for most part of the night. No sign of distress noted. Slept a total of 5 hours. Will continue to monitor.

## 2012-10-22 NOTE — Behavioral Health Treatment Team (Signed)
Pt attend goals group.

## 2012-10-23 MED ADMIN — albuterol (PROVENTIL HFA, VENTOLIN HFA) inhaler 2 Puff: RESPIRATORY_TRACT | @ 12:00:00 | NDC 00173068224

## 2012-10-23 MED ADMIN — fluPHENAZine (PROLIXIN) tablet 5 mg: ORAL | @ 21:00:00 | NDC 51079048701

## 2012-10-23 MED ADMIN — fluPHENAZine (PROLIXIN) 5mg/mL solution 5 mg: ORAL | @ 12:00:00 | NDC 00121065304

## 2012-10-23 MED ADMIN — albuterol (PROVENTIL HFA, VENTOLIN HFA) inhaler 2 Puff: RESPIRATORY_TRACT | @ 16:00:00 | NDC 00173068224

## 2012-10-23 MED ADMIN — amLODIPine (NORVASC) tablet 5 mg: ORAL | @ 12:00:00 | NDC 51079045101

## 2012-10-23 MED ADMIN — albuterol (PROVENTIL HFA, VENTOLIN HFA) inhaler 2 Puff: RESPIRATORY_TRACT | @ 21:00:00 | NDC 00173068224

## 2012-10-23 MED ADMIN — ibuprofen (MOTRIN) tablet 400 mg: ORAL | @ 12:00:00 | NDC 63739044210

## 2012-10-23 MED FILL — FLUPHENAZINE 5 MG TAB: 5 mg | ORAL | Qty: 1

## 2012-10-23 MED FILL — IBUPROFEN 400 MG TAB: 400 mg | ORAL | Qty: 1

## 2012-10-23 MED FILL — AMLODIPINE 5 MG TAB: 5 mg | ORAL | Qty: 1

## 2012-10-23 MED FILL — FLUPHENAZINE 5 MG/ML ORAL CONCENTRATE: 5 mg/mL | ORAL | Qty: 1

## 2012-10-23 NOTE — Behavioral Health Treatment Team (Signed)
Pt c/o of leg, hip and back pain. Pt received Ibuprofen 400 mg.  Meds effective. Will continue to monitor.

## 2012-10-23 NOTE — Progress Notes (Signed)
Problem: Altered Thought Process (Adult/Pediatric)  Goal: *STG: Complies with medication therapy  Outcome: Progressing Towards Goal  Pt has been visible on the unit.  He is alert and oriented.  At the start of the shift, pt's mood was irritable.  As the shift progressed pt's mood became more appropriate.  Pt displays paranoia and flight of ideas at times.  He denies a/v hallucinations.  He has been meal and medication compliant.  Pt attends some groups.  He currently denies suicidal/homicidal ideations.  His behavior has been more appropriate today.  Pt did not receive any prn psych meds on this shift.  Staff will continue to monitor pt for safety and needs.

## 2012-10-23 NOTE — Behavioral Health Treatment Team (Signed)
Pt rested in bed with eyes closed for approximately 7 hours. No reports made by pt of having any difficulty sleeping while staff conducted rounds. Respirations even and unlabored. No acute distress noted. Was maintained on q15min checks. Will continue to monitor.

## 2012-10-23 NOTE — Behavioral Health Treatment Team (Signed)
Psychiatric Progress Note    Date: 10/23/2012  Account Number:  [id number]  Name: Antonio Potter      PSYCHOTHERAPY SESSION NOTE:  Length of psychotherapy session: 20 minutes    Psychoeducation provided.   Treatment plan reviewed with patient and counseling provided---including diagnosis, treatment options and medications.     Supportive/Cognitive/Reality-Oriented psychotherapy provided in regards to various psychosocial stressors including:   pre-admission and current problems   Housing issues   Occupational issues   Academic issues   Legal issues   Medical issues   Interpersonal conflicts   Stress of hospitalization              Worked on issues of denial & effects of substance dependency/use    Extended energy and skill set needed to engage pt in psychotherapy due to the following: resistiveness of patient, complexity, resistance/negativity of patient, confrontational/hostile behaviors, and/or severe abnormalities in thought processes/psychosis resulting in the loss of expressive/receptive language communication skills.      Pt is not progressing in therapeutic modalites. Pt less guarded today. Willing to take PO meds. Focused on housing.                  E & M PROGRESS NOTE:  To include the following:   A coordinated, multidisplinary treatment team round was conducted with the patient, nurses, pharmcist, Administrator present. Discussions held with case manager, and/or with family members;   Complete current electronic health record for patient was reviewed in full including consultant notes, ancillary staff notes, nurses and tech notes, labs and vitals.       SUBJECTIVE:   CC: " i took my meds this morning "    HPI/Interval History:   Janard reports the following psychiatric symptoms:  psychosis.  The symptoms have been present for years most likely and are of high severity. The symptoms occur daily.  Additional symptoms include agitation. Current symptoms are likely precipitated by tx non compliance.   Pt has no insight into mental health symptoms but did allude today to past treatment.     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      Past Medical History:  Active Ambulatory Problems     Diagnosis Date Noted   ??? Altered thought processes 10/19/2012     Resolved Ambulatory Problems     Diagnosis Date Noted   ??? No Resolved Ambulatory Problems     No Additional Past Medical History     Past medical history has been reviewed (see dictated report) with no additional updates (I asked patient and no additional past medical history provided).    Social History:     Social history has been reviewed (see dictated report) with no additional updates. Pt wants to relocate to [place]. Continues to focus on housing/shelters.    Family History:  Family history has been reviewed (see dictated report) with no additional updates (I asked patient and no additional family history provided).    OBJECTIVE:                 MENTAL STATUS EXAM:   FINDINGS WITHIN NORMAL LIMITS (WNL) UNLESS OTHERWISE STATED BELOW:    Orientation Sl oriented to situation,  oriented to time, place and person   Vital Signs See below (reviewed)   Gait Within normal limits   Abnormal Muscular Movements/Tone/Behavior No EPS, no evidence of TD, restless and within normal limits   Relations guarded, less argumentative   General Appearance:  age appropriate, casually dressed, disheveled, malodorous,  overweight and less intimidating today   Speech/Language:  Pressured, normal vol   Thought Process: goal directed and loose associations   Thought Content delusions and  ? Hallucinations-pt denies   Suicidal Ideations no intention   Homicidal Ideations no intention   Mood:  Irritable/wnl    Affect:  Irritable/wnl/ "controled" agitation   Memory recent  adequate   Memory remote:  adequate   Concentration/Attention:  adequate   Fund of Knowledge Fair/average   Insight:  poor   Reliability poor   Judgment:  poor     Pertinent data:  Patient Vitals for the past 8 hrs:   BP Temp Pulse Resp   10/23/12 0807 119/72 mmHg - 86 -     No results found for this or any previous visit (from the past 24 hour(s)).    Medications:  Current Facility-Administered Medications   Medication Dose Route Frequency   ??? fluPHENAZine (PROLIXIN) tablet 5 mg  5 mg Oral BID   ??? fluPHENAZine (PROLIXIN) injection 5 mg +++COURT ORDERED MEDICATION FOR REFUSAL OF PO PROLIXIN+++  5 mg IntraMUSCular BID   ??? [DISCONTINUED] fluPHENAZine (PROLIXIN) 5mg /mL solution 5 mg  5 mg Oral BID   ??? ziprasidone (GEODON) 20 mg in sterile water (preservative free) injection  20 mg IntraMUSCular BID PRN   ??? OLANZapine (ZYPREXA) tablet 5 mg  5 mg Oral Q6H PRN   ??? benztropine (COGENTIN) tablet 2 mg  2 mg Oral BID PRN   ??? benztropine (COGENTIN) injection 2 mg  2 mg IntraMUSCular Q12H PRN   ??? LORazepam (ATIVAN) injection 2 mg  2 mg IntraMUSCular Q4H PRN   ??? LORazepam (ATIVAN) tablet 1 mg  1 mg Oral Q4H PRN   ??? zolpidem (AMBIEN) tablet 10 mg  10 mg Oral QHS PRN   ??? acetaminophen (TYLENOL) tablet 650 mg  650 mg Oral Q4H PRN   ??? ibuprofen (MOTRIN) tablet 400 mg  400 mg Oral Q8H PRN   ??? magnesium hydroxide (MILK OF MAGNESIA) oral suspension 30 mL  30 mL Oral DAILY PRN   ??? nicotine (NICODERM CQ) 21 mg/24 hr patch 1 Patch  1 Patch TransDERmal DAILY PRN   ??? amLODIPine (NORVASC) tablet 5 mg  5 mg Oral DAILY   ??? albuterol (PROVENTIL HFA, VENTOLIN HFA) inhaler 2 Puff  2 Puff Inhalation Q4H RT       Allergies:  No Known Allergies    Scheduled Medications:  Current Facility-Administered Medications   Medication Dose Route Frequency   ??? fluPHENAZine (PROLIXIN) tablet 5 mg  5 mg Oral BID   ??? fluPHENAZine (PROLIXIN) injection 5 mg +++COURT ORDERED MEDICATION FOR REFUSAL OF PO  PROLIXIN+++  5 mg IntraMUSCular BID   ??? [DISCONTINUED] fluPHENAZine (PROLIXIN) 5mg /mL solution 5 mg  5 mg Oral BID   ??? amLODIPine (NORVASC) tablet 5 mg  5 mg Oral DAILY   ??? albuterol (PROVENTIL HFA, VENTOLIN HFA) inhaler 2 Puff  2 Puff Inhalation Q4H RT         ASSESSMENT/PLAN:     Diagnoses:  Patient Active Hospital Problem List:   Psychosis (10/19/2012)    Assessment: Patient is still with a severe exacerbation of condition which is worsening, not  improving. Pt symptoms seem to be chronic in nature, pt denies psych hx but alluded to past tx today, need collateral, consider organic wu if no records found, pt presents with symptoms of schizophrenia vs schizoaffective disorder, will schedule antipsychotic, med petition--court ordered meds approved    Plan: past records if available, will initiate organic w/u as no past psych hx noted yet, start antipsychotic and titrate as needed, court ordered meds if refuses         The following regarding medications was addressed during rounds with patient:   the risks and benefits of the proposed medication. The patient was given the opportunity to ask questions. Informed consent given to the use of the above medications.    Will continue to adjust psychiatric and non-psychiatric medications   (see above "medication" section for details) as deemed appropriate &  based upon diagnoses and response to treatment.     Will continue to order blood tests/labs and diagnostic tests as deemed appropriate and review results as they become available.    Will order old records and review once available as deemed appropriate.    Will gather additional collateral information from friends, family and o/p treatment team as deemed appropriate.    Will continue to provide individual, milieu, occupational, group, and   substance abuse therapies to address target symptoms as deemed   appropriate for the individual patient.    EXPECTED DISCHARGE DATE (Day): TBD    DISPOSITION: Home    Signed By: Cyndra Numbers, NP

## 2012-10-23 NOTE — Behavioral Health Treatment Team (Signed)
Pt did not attend Nursing Education Group.

## 2012-10-23 NOTE — Behavioral Health Treatment Team (Cosign Needed)
GROUP THERAPY PROGRESS NOTE    The patient Antonio Potter a 52 y.o. male is participating in Coping Skills Group.     Group time: 45 minutes    Personal goal for participation: To identify positive words to live by    Goal orientation:  personal    Group therapy participation: active    Therapeutic interventions reviewed and discussed: worksheet    Impression of participation:  The patient was attentive.    BEVERLY S BAKER  10/23/2012  1:59 PM

## 2012-10-23 NOTE — Behavioral Health Treatment Team (Cosign Needed)
Client didn't attend community meeting

## 2012-10-23 NOTE — Behavioral Health Treatment Team (Cosign Needed Addendum)
Client visible on the unit. alert and oriented.,mood irritable, hostel at times Clt, displays periods  paranoia and flight of ideas stating peers are out to kill him Clt. given new roommate same behavior.  He denies A/V hallucinations. He has been meal and medication compliant.Clt. currently denies suicidal/homicidal  ideations.  Staff will continue to monitor pt for safety and needs.

## 2012-10-24 MED ADMIN — fluPHENAZine (PROLIXIN) injection 5 mg +++COURT ORDERED MEDICATION FOR REFUSAL OF PO PROLIXIN+++: INTRAMUSCULAR | @ 12:00:00 | NDC 63323028110

## 2012-10-24 MED ADMIN — OLANZapine (ZYPREXA) tablet 5 mg: ORAL | @ 01:00:00 | NDC 68084052611

## 2012-10-24 MED ADMIN — albuterol (PROVENTIL HFA, VENTOLIN HFA) inhaler 2 Puff: RESPIRATORY_TRACT | @ 01:00:00 | NDC 00173068224

## 2012-10-24 MED ADMIN — albuterol (PROVENTIL HFA, VENTOLIN HFA) inhaler 2 Puff: RESPIRATORY_TRACT | @ 17:00:00 | NDC 00173068224

## 2012-10-24 MED ADMIN — LORazepam (ATIVAN) tablet 1 mg: ORAL | @ 01:00:00 | NDC 68084041311

## 2012-10-24 MED ADMIN — fluPHENAZine (PROLIXIN) tablet 5 mg: ORAL | @ 22:00:00 | NDC 51079048701

## 2012-10-24 MED FILL — LORAZEPAM 1 MG TAB: 1 mg | ORAL | Qty: 1

## 2012-10-24 MED FILL — FLUPHENAZINE 5 MG TAB: 5 mg | ORAL | Qty: 1

## 2012-10-24 MED FILL — FLUPHENAZINE HCL 2.5 MG/ML IJ SOLN: 2.5 mg/mL | INTRAMUSCULAR | Qty: 10

## 2012-10-24 NOTE — Behavioral Health Treatment Team (Cosign Needed)
GROUP THERAPY PROGRESS NOTE    The patient Antonio Potter a 52 y.o. male is participating in Coping Skills Group.     Group time: 45 minutes    Personal goal for participation: To participate in coping skills game    Goal orientation:  personal    Group therapy participation: active    Therapeutic interventions reviewed and discussed: positive coping skills board game    Impression of participation:  The patient was attentive.    BEVERLY S BAKER  10/24/2012  1:54 PM

## 2012-10-24 NOTE — Behavioral Health Treatment Team (Cosign Needed)
Patient did not participate in goals group

## 2012-10-24 NOTE — Behavioral Health Treatment Team (Cosign Needed)
Client didn't attend community meeting

## 2012-10-24 NOTE — Behavioral Health Treatment Team (Cosign Needed)
Bht.Note: Pt continues to be agitated,restless and using  Inappropriate language towards the male staff on the unit calling them bitchs and whores.Loud beating on the door,yelling and screaming.Pt remains on 1:1 safety checks well secluded.

## 2012-10-24 NOTE — Behavioral Health Treatment Team (Cosign Needed)
GROUP THERAPY PROGRESS NOTE    The patient Antonio Potter a 52 y.o. male is participating in Creative Expression Group.     Group time: 1 hour    Personal goal for participation: To concentrate on selected task    Goal orientation: social    Group therapy participation: active    Therapeutic interventions reviewed and discussed: Crafts, games, music    Impression of participation: The patient was attentive.    BEVERLY S BAKER  10/24/2012  4:31 PM

## 2012-10-24 NOTE — Behavioral Health Treatment Team (Cosign Needed)
Patient did not participate in community meeting

## 2012-10-24 NOTE — Behavioral Health Treatment Team (Signed)
Pt refused 8 am meds this morning.  Pt received Prolixin 5 mg due to court order.  Pt refused breakfast this morning but did drink his juice.  Pt was very oppositional upon approach.  Pt appeared to be upset when asked about taking meds and the explanations about not taking his med.  Pt remains in locked seclusion and on 1:1 observation.  Will continue to monitor.

## 2012-10-24 NOTE — Behavioral Health Treatment Team (Cosign Needed)
[facility] who was absent from [employer].MtgFayrene Fearing E CROSS  10/24/2012  8:03 AM

## 2012-10-24 NOTE — Behavioral Health Treatment Team (Signed)
Pt rested in bed with eyes closed for approximately 7 hours. No reports made by pt of having any difficulty sleeping while staff conducted rounds. Respirations even and unlabored. No acute distress noted. Was maintained on q15min checks. Will continue to monitor.

## 2012-10-24 NOTE — Behavioral Health Treatment Team (Signed)
Patient was sitting in the hallway in front of the computer writer ask him to please come from the chair and return to his room,patient said that he is feeling like someone is after him in his room, and a women should not ask him to to move and that they have no authority over him, he began  threaten staff and yell and curse, when staff told him he would be discharged for this behavior he then moved from the chair into the seclusion room, staff sitting with him ask if he will be leaving soon . He then stopped to inquire were would he go, he then said 'I hope yall get me a shelter here in [place]" an order was obtained for locked seclusion due to the risk for violence, because of the threats to harm male staff. Male staff is sitting with him at the present time and he will be offered regularly scheduled medication.

## 2012-10-24 NOTE — Behavioral Health Treatment Team (Cosign Needed)
Oriented to person, place, time, and situation.  Pt.'s personal hygiene skills and grooming habits are poor.  Encouraged to shower this shift; refused.  Pt.'s nutritional intake is adequate.  During this shift this pt. presents as being paranoid, guarded, delusional, and responding to internal stimuli x 1.  Affect/Mood:  Labile, Irritable.  Pt. denies A/V hallucinations and S/H ideations.  Encourages handling of personal hygiene.  Will redirect focus as needed providing reality orientation.  Offers support and anticipates needs.  Q-15 minute checks maintained for safety per Hospital protocol.

## 2012-10-24 NOTE — Behavioral Health Treatment Team (Cosign Needed Addendum)
Bht Note: Pt at this documentation remains agitated and paranoid.Pt continues to be delusional,responding  To internal stimuli.Clearly talking and singing to himself.Then pt abruptly  Starting beating on the door of the seclusion room.Pt loud cursing at staff.Pt has been served lunch which he wasComplaining about.Pt also has had restroom time.Pt remains oppositional and testing limits even while in seclusion.

## 2012-10-24 NOTE — Behavioral Health Treatment Team (Signed)
Psychiatric Progress Note    Date: 10/24/2012  Account Number:  [id number]  Name: Antonio Potter      PSYCHOTHERAPY SESSION NOTE:  Length of psychotherapy session: 20 minutes    Psychoeducation provided.   Treatment plan reviewed with patient and counseling provided---including diagnosis, treatment options and medications.     Supportive/Cognitive/Reality-Oriented psychotherapy provided in regards to various psychosocial stressors including:   pre-admission and current problems   Housing issues   Occupational issues   Academic issues   Legal issues   Medical issues   Interpersonal conflicts   Stress of hospitalization              Worked on issues of denial & effects of substance dependency/use    Extended energy and skill set needed to engage pt in psychotherapy due to the following: resistiveness of patient, complexity, resistance/negativity of patient, confrontational/hostile behaviors, and/or severe abnormalities in thought processes/psychosis resulting in the loss of expressive/receptive language communication skills.      Pt is not progressing in therapeutic modalites. Pt in locked seclusion. Refuses PO meds today. Focused on peers in milieu and past drug users out to kill him.                  E & M PROGRESS NOTE:  To include the following:   A coordinated, multidisplinary treatment team round was conducted with the patient, nurses, pharmcist, Administrator present. Discussions held with case manager, and/or with family members;   Complete current electronic health record for patient was reviewed in full including consultant notes, ancillary staff notes, nurses and tech notes, labs and vitals.       SUBJECTIVE:   CC: " i know those people still want to kill me "    HPI/Interval History:   Antonio Potter reports the following psychiatric symptoms:  psychosis.  The symptoms have been present for years most likely and are of high severity. The symptoms occur daily.  Additional symptoms include agitation.  Current symptoms are likely precipitated by tx non compliance.  Pt has no insight into mental health symptoms but did allude today to past treatment.     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      Past Medical History:  Active Ambulatory Problems     Diagnosis Date Noted   ??? Altered thought processes 10/19/2012     Resolved Ambulatory Problems     Diagnosis Date Noted   ??? No Resolved Ambulatory Problems     No Additional Past Medical History     Past medical history has been reviewed (see dictated report) with no additional updates (I asked patient and no additional past medical history provided).    Social History:     Social history has been reviewed (see dictated report) with no additional updates. Pt wants to relocate to [place]. Continues to focus on housing/shelters.    Family History:  Family history has been reviewed (see dictated report) with no additional updates (I asked patient and no additional family history provided).    OBJECTIVE:                 MENTAL STATUS EXAM:   FINDINGS WITHIN NORMAL LIMITS (WNL)  UNLESS OTHERWISE STATED BELOW:    Orientation Sl oriented to situation, oriented to time, place and person   Vital Signs See below (reviewed)   Gait Within normal limits   Abnormal Muscular Movements/Tone/Behavior No EPS, no evidence of TD, restless and within normal limits   Relations guarded, argumentative, threatening   General Appearance:  age appropriate, casually dressed, disheveled, malodorous,  overweight and intimidating    Speech/Language:  Pressured, loud   Thought Process: goal directed and loose associations   Thought Content delusions and  ? Hallucinations-pt denies   Suicidal Ideations no intention    Homicidal Ideations no intention   Mood:  Irritable/wnl   Affect:  Irritable/wnl/ "controled" agitation   Memory recent  adequate   Memory remote:  adequate   Concentration/Attention:  adequate   Fund of Knowledge Fair/average   Insight:  poor   Reliability poor   Judgment:  poor     Pertinent data:  No data found.    No results found for this or any previous visit (from the past 24 hour(s)).    Medications:  Current Facility-Administered Medications   Medication Dose Route Frequency   ??? fluPHENAZine (PROLIXIN) tablet 5 mg  5 mg Oral BID   ??? fluPHENAZine (PROLIXIN) injection 5 mg +++COURT ORDERED MEDICATION FOR REFUSAL OF PO PROLIXIN+++  5 mg IntraMUSCular BID   ??? ziprasidone (GEODON) 20 mg in sterile water (preservative free) injection  20 mg IntraMUSCular BID PRN   ??? OLANZapine (ZYPREXA) tablet 5 mg  5 mg Oral Q6H PRN   ??? benztropine (COGENTIN) tablet 2 mg  2 mg Oral BID PRN   ??? benztropine (COGENTIN) injection 2 mg  2 mg IntraMUSCular Q12H PRN   ??? LORazepam (ATIVAN) injection 2 mg  2 mg IntraMUSCular Q4H PRN   ??? LORazepam (ATIVAN) tablet 1 mg  1 mg Oral Q4H PRN   ??? zolpidem (AMBIEN) tablet 10 mg  10 mg Oral QHS PRN   ??? acetaminophen (TYLENOL) tablet 650 mg  650 mg Oral Q4H PRN   ??? ibuprofen (MOTRIN) tablet 400 mg  400 mg Oral Q8H PRN   ??? magnesium hydroxide (MILK OF MAGNESIA) oral suspension 30 mL  30 mL Oral DAILY PRN   ??? nicotine (NICODERM CQ) 21 mg/24 hr patch 1 Patch  1 Patch TransDERmal DAILY PRN   ??? amLODIPine (NORVASC) tablet 5 mg  5 mg Oral DAILY   ??? albuterol (PROVENTIL HFA, VENTOLIN HFA) inhaler 2 Puff  2 Puff Inhalation Q4H RT       Allergies:  No Known Allergies    Scheduled Medications:  Current Facility-Administered Medications   Medication Dose Route Frequency   ??? fluPHENAZine (PROLIXIN) tablet 5 mg  5 mg Oral BID   ??? fluPHENAZine (PROLIXIN) injection 5 mg +++COURT ORDERED MEDICATION FOR REFUSAL OF PO PROLIXIN+++  5 mg IntraMUSCular BID   ??? amLODIPine (NORVASC) tablet 5 mg  5 mg Oral DAILY   ???  albuterol (PROVENTIL HFA, VENTOLIN HFA) inhaler 2 Puff  2 Puff Inhalation Q4H RT         ASSESSMENT/PLAN:     Diagnoses:  Patient Active Hospital Problem List:   Psychosis (10/19/2012)    Assessment: Patient is still with a severe exacerbation of condition which is worsening, not improving. Pt symptoms seem to be chronic in nature possibly due to tx noncomp. Pt denies psych hx but alluded to past tx today, need collateral, consider organic wu if no records found, pt presents with symptoms of schizophrenia vs  schizoaffective disorder, will schedule antipsychotic, med petition--court ordered meds approved    Plan: past records if available, will initiate organic w/u as no past psych hx noted yet, start antipsychotic and titrate as needed, court ordered meds if refuses         The following regarding medications was addressed during rounds with patient:   the risks and benefits of the proposed medication. The patient was given the opportunity to ask questions. Informed consent given to the use of the above medications.    Will continue to adjust psychiatric and non-psychiatric medications   (see above "medication" section for details) as deemed appropriate &  based upon diagnoses and response to treatment.     Will continue to order blood tests/labs and diagnostic tests as deemed appropriate and review results as they become available.    Will order old records and review once available as deemed appropriate.    Will gather additional collateral information from friends, family and o/p treatment team as deemed appropriate.    Will continue to provide individual, milieu, occupational, group, and   substance abuse therapies to address target symptoms as deemed   appropriate for the individual patient.    EXPECTED DISCHARGE DATE (Day): approx 6 days    DISPOSITION: Home    Signed By: Cyndra Numbers, NP

## 2012-10-24 NOTE — Behavioral Health Treatment Team (Signed)
BEHAVIORAL HEALTH RESTRAINT/SECLUSION INITIAL ASSESSMENT      1. Assessment of High Risk factors: Preexisting medical conditions that places patient at greater risk when placed in restraints are as follows: None    2. Preexisting history of psychological conditions that place patient at greater risk when placed in restraints: None    3. Current behavior/mental status: Agitated, Threatening physical abuse and Verbally abusive    4. Initial use of restraint for this patient is justified by: Imminent risk of injury to others    5. Least restrictive tools/methods (Check all that apply): Attentive listening, Quiet time, Redirect patient focus and Verbal de-escalation    6. Criteria for release: No longer verbalizing threats of harm to self or others, Acute signs and symptoms necessitating restraint/seclusion have decreased substantially to the level where patient safety function in less restrictive environment and Substantial reduction in level of agitation/anxiety as indicated in reduction in motor over activity, ability to focus, maintain attention and decrease in hostility    7. Treatment plan revisions to assist the patient in regaining control: Anger assessment and Medication evaluation    8. Patient consented to family involvement in restraint/seclusion process: No    9. Personal safety search completed: Yes - [facility]    10. Vital Signs:         B/P: refused         Pulse: refused         Respirations: refused         Temperature: refused        MD/RN Signature:__espain rn_______________________________  Date:___06/26/2014__________

## 2012-10-25 MED ADMIN — amLODIPine (NORVASC) tablet 5 mg: ORAL | @ 12:00:00 | NDC 51079045101

## 2012-10-25 MED ADMIN — fluPHENAZine (PROLIXIN) tablet 5 mg: ORAL | @ 12:00:00 | NDC 51079048701

## 2012-10-25 MED ADMIN — fluPHENAZine (PROLIXIN) tablet 10 mg: ORAL | @ 21:00:00 | NDC 51079048701

## 2012-10-25 MED FILL — FLUPHENAZINE 5 MG TAB: 5 mg | ORAL | Qty: 2

## 2012-10-25 MED FILL — AMLODIPINE 5 MG TAB: 5 mg | ORAL | Qty: 1

## 2012-10-25 NOTE — Behavioral Health Treatment Team (Signed)
Psychiatric Progress Note    Date: 10/25/2012  Account Number:  [id number]  Name: Antonio Potter      PSYCHOTHERAPY SESSION NOTE:  Length of psychotherapy session: 20 minutes    Psychoeducation provided.   Treatment plan reviewed with patient and counseling provided---including diagnosis, treatment options and medications.     Supportive/Cognitive/Reality-Oriented psychotherapy provided in regards to various psychosocial stressors including:   pre-admission and current problems   Housing issues   Occupational issues   Academic issues   Legal issues   Medical issues   Interpersonal conflicts   Stress of hospitalization              Worked on issues of denial & effects of substance dependency/use    Extended energy and skill set needed to engage pt in psychotherapy due to the following: resistiveness of patient, complexity, resistance/negativity of patient, confrontational/hostile behaviors, and/or severe abnormalities in thought processes/psychosis resulting in the loss of expressive/receptive language communication skills.      Pt is not progressing in therapeutic modalites. Pt preoccupied with services and disposition today. Still no insight into psych symptoms. Has verbalized thoughts that peers are out to kill him.                  E & M PROGRESS NOTE:  To include the following:   A coordinated, multidisplinary treatment team round was conducted with the patient, nurses, pharmcist, Administrator present. Discussions held with case manager, and/or with family members;   Complete current electronic health record for patient was reviewed in full including consultant notes, ancillary staff notes, nurses and tech notes, labs and vitals.       SUBJECTIVE:   CC: " where are you sending me when I leave ? "    HPI/Interval History:   [name] reports the following psychiatric symptoms:  psychosis.  The symptoms have been present for years most likely and are of high severity. The symptoms occur dailyare not  improving yet.  Additional symptoms include agitation. Current symptoms are likely precipitated by tx non compliance.  Pt has no insight into mental health symptoms but did has alluded to past treatment.     Review of Systems:  Appetite:no change from normal   Sleep:poor, reportedly 0 hours and refused prn meds   Pt reports feeling well, no fever/fatigue  Pt denies GI symptoms such as N/V; Pt denies dizziness/HA, Pt with h/o tracheostomy after ARF-site appears dry/well healed overall, some noise during inhalation noted, no SOB or Chest pain    Side Effects:  None reported or admitted to.      Past Medical History:  Active Ambulatory Problems     Diagnosis Date Noted   ??? Altered thought processes 10/19/2012     Resolved Ambulatory Problems     Diagnosis Date Noted   ??? No Resolved Ambulatory Problems     No Additional Past Medical History     Past medical history has been reviewed (see dictated report) with no additional updates (I asked patient and no additional past medical history provided).    Social History:     Social history has been reviewed (see dictated report) with no additional updates. Pt wants to relocate to [place]. Continues to focus on housing/shelters. Demanding to see SW to get services reconnected in [place].    Family History:  Family history has been reviewed (see dictated report) with no additional updates (I asked patient and no additional family history provided).    OBJECTIVE:  MENTAL STATUS EXAM:   FINDINGS WITHIN NORMAL LIMITS (WNL) UNLESS OTHERWISE STATED BELOW:    Orientation Sl oriented to situation, oriented to time, place and person   Vital Signs See below (reviewed)   Gait Within normal limits   Abnormal Muscular Movements/Tone/Behavior No EPS, no evidence of TD, restless and within normal limits   Relations guarded, argumentative, threatening, evasive, frequently asks to see staff in his room to talk privately   General Appearance:  age appropriate, casually dressed, disheveled,  malodorous,  overweight and intimidating    Speech/Language:  Pressured, loud   Thought Process: goal directed and loose associations   Thought Content delusions and  ? Hallucinations-pt denies but appears internally preoccupied   Suicidal Ideations no intention   Homicidal Ideations no intention   Mood:  Irritable/wnl   Affect:  Irritable/wnl/ "controlled" agitation   Memory recent  adequate   Memory remote:  adequate   Concentration/Attention:  adequate   Fund of Knowledge Fair/average   Insight:  poor   Reliability poor   Judgment:  poor     Pertinent data:  Patient Vitals for the past 8 hrs:   BP Temp Pulse Resp   10/25/12 0714 132/94 mmHg 97.1 ??F (36.2 ??C) 97 18     No results found for this or any previous visit (from the past 24 hour(s)).    Medications:  Current Facility-Administered Medications   Medication Dose Route Frequency   ??? fluPHENAZine (PROLIXIN) tablet 10 mg  10 mg Oral BID   ??? albuterol (PROVENTIL HFA, VENTOLIN HFA) inhaler 2 Puff  2 Puff Inhalation Q4H PRN   ??? [DISCONTINUED] fluPHENAZine (PROLIXIN) tablet 5 mg  5 mg Oral BID   ??? fluPHENAZine (PROLIXIN) injection 5 mg +++COURT ORDERED MEDICATION FOR REFUSAL OF PO PROLIXIN+++  5 mg IntraMUSCular BID   ??? ziprasidone (GEODON) 20 mg in sterile water (preservative free) injection  20 mg IntraMUSCular BID PRN   ??? OLANZapine (ZYPREXA) tablet 5 mg  5 mg Oral Q6H PRN   ??? benztropine (COGENTIN) tablet 2 mg  2 mg Oral BID PRN   ??? benztropine (COGENTIN) injection 2 mg  2 mg IntraMUSCular Q12H PRN   ??? LORazepam (ATIVAN) injection 2 mg  2 mg IntraMUSCular Q4H PRN   ??? LORazepam (ATIVAN) tablet 1 mg  1 mg Oral Q4H PRN   ??? zolpidem (AMBIEN) tablet 10 mg  10 mg Oral QHS PRN   ??? acetaminophen (TYLENOL) tablet 650 mg  650 mg Oral Q4H PRN   ??? ibuprofen (MOTRIN) tablet 400 mg  400 mg Oral Q8H PRN   ??? magnesium hydroxide (MILK OF MAGNESIA) oral suspension 30 mL  30 mL Oral DAILY PRN   ??? nicotine (NICODERM CQ) 21 mg/24 hr patch 1 Patch  1 Patch TransDERmal DAILY PRN   ???  amLODIPine (NORVASC) tablet 5 mg  5 mg Oral DAILY   ??? [DISCONTINUED] albuterol (PROVENTIL HFA, VENTOLIN HFA) inhaler 2 Puff  2 Puff Inhalation Q4H RT       Allergies:  No Known Allergies    Scheduled Medications:  Current Facility-Administered Medications   Medication Dose Route Frequency   ??? fluPHENAZine (PROLIXIN) tablet 10 mg  10 mg Oral BID   ??? [DISCONTINUED] fluPHENAZine (PROLIXIN) tablet 5 mg  5 mg Oral BID   ??? fluPHENAZine (PROLIXIN) injection 5 mg +++COURT ORDERED MEDICATION FOR REFUSAL OF PO PROLIXIN+++  5 mg IntraMUSCular BID   ??? amLODIPine (NORVASC) tablet 5 mg  5 mg Oral DAILY   ??? [  DISCONTINUED] albuterol (PROVENTIL HFA, VENTOLIN HFA) inhaler 2 Puff  2 Puff Inhalation Q4H RT         ASSESSMENT/PLAN:     Diagnoses:  Patient Active Hospital Problem List:   Psychosis (10/19/2012)    Assessment: Patient is still with a severe exacerbation of condition which is worsening, not improving. Pt symptoms seem to be chronic in nature possibly due to tx noncomp. Appears to be a slow responder in general. Refuses meds and paranoid about taking them. Pt denies psych hx but alluded to past tx. Need collateral, hs refused labs for organic wu, pt presents with symptoms of schizophrenia vs schizoaffective disorder, will schedule antipsychotic, med petition--court ordered meds approved, often refuses PO meds but states "I took them"    Plan: past records if available, will initiate organic w/u as no past psych hx noted yet, start antipsychotic and titrate as needed, court ordered meds in place      The following regarding medications was addressed during rounds with patient:   the risks and benefits of the proposed medication. The patient was given the opportunity to ask questions. Informed consent given to the use of the above medications.    Will continue to adjust psychiatric and non-psychiatric medications   (see above "medication" section for details) as deemed appropriate &  based upon diagnoses and response to  treatment.     Will continue to order blood tests/labs and diagnostic tests as deemed appropriate and review results as they become available.    Will order old records and review once available as deemed appropriate.    Will gather additional collateral information from friends, family and o/p treatment team as deemed appropriate.    Will continue to provide individual, milieu, occupational, group, and   substance abuse therapies to address target symptoms as deemed   appropriate for the individual patient.    EXPECTED DISCHARGE DATE (Day): approx 6 days    DISPOSITION: Home    Signed By: Cyndra Numbers, NP

## 2012-10-25 NOTE — Behavioral Health Treatment Team (Signed)
Patient has been alert and visable on the unit oriented x2, patient has been cooperative and apologetic for his behavior, patient has asked several times to speak with the chaplain and his social worker both have been notified, patient has been medication and meal compliant, patient will be encouraged make staff aware of needs.

## 2012-10-25 NOTE — Progress Notes (Signed)
Spiritual Care Assessment/Progress Notes    Antonio Potter [id number]  [id number]    [birth year]  52 y.o.  male    Patient Telephone Number: [phone] (home)   Religious Affiliation: No religion   Language: English   No emergency contact information on file.   Patient Active Problem List    Diagnosis Date Noted   ??? Psychosis 10/19/2012   ??? Altered thought processes 10/19/2012        Date: 10/25/2012       Level of Religious/Spiritual Activity:  []          Involved in faith tradition/spiritual practice    []          Not involved in faith tradition/spiritual practice  [x]          Spiritually oriented    []          Claims no spiritual orientation    []          seeking spiritual identity  []          Feels alienated from religious practice/tradition  []          Feels angry about religious practice/tradition  [x]          Spirituality/religious PRAYER IS a resource for coping at this time.  []          Not able to assess due to medical condition    Services Provided Today:  []          crisis intervention    []          reading Scriptures  [x]          spiritual assessment    [x]          prayer  [x]          empathic listening/emotional support  []          rites and rituals (cite in comments)  []          life review     []          religious support  []          theological development   []          advocacy  []          ethical dialog     []          blessing  []          bereavement support    []          support to family  []          anticipatory grief support   []          help with AMD  []          spiritual guidance    []          meditation      Spiritual Care Needs  []          Emotional Support  [x]          Spiritual/Religious Care  []          Loss/Adjustment  []          Advocacy/Referral /Ethics  []          No needs expressed at this time  []          Other: (note in comments)  Spiritual Care Plan  []          Follow up visits with pt/family  []          Provide materials  []   Schedule sacraments  []          Contact  Community Clergy  [x]          Follow up as needed  []          Other: (note in comments)     Comments: Responded to request for chaplains from Mr. Murton.  He shared he has strong faith in God and reads and knows the Bible.  He shared he came here [place].  We shared psalms 23 and 121 together.  Provided prayer at his request.    Visited by: Chaplain Coletta Memos [facility]  Chaplain Paging Service [phone] -PRAY [phone])

## 2012-10-25 NOTE — Behavioral Health Treatment Team (Cosign Needed)
GROUP THERAPY PROGRESS NOTE    The patient Antonio Potter is participating in Reflection Group.    Group time: 30 minutes    Personal goal for participation: Personal    Goal orientation: Reflection    Group therapy participation: Active    Therapeutic interventions reviewed and discussed: Yes    ETHEL BARNETT-JOHNSON  10/25/2012

## 2012-10-25 NOTE — Behavioral Health Treatment Team (Signed)
Pt had 0 hours of sleep this shift. Observed on q15' rounds, pt is testing limits and needs repeated direction.

## 2012-10-25 NOTE — Behavioral Health Treatment Team (Cosign Needed)
GROUP THERAPY PROGRESS NOTE    The patient Antonio Potter a 52 y.o. male is participating in Coping Skills Group.     Group time: 45 minutes    Personal goal for participation: To participate in relaxation activity    Goal orientation:  relaxation    Group therapy participation: active    Therapeutic interventions reviewed and discussed: favorite ways to relax    Impression of participation:  The patient was attentive.    BEVERLY S BAKER  10/25/2012  1:57 PM

## 2012-10-25 NOTE — Behavioral Health Treatment Team (Cosign Needed)
GROUP THERAPY PROGRESS NOTE    The patient Antonio Potter a 52 y.o. male is participating in Creative Expression Group.     Group time: 1 hour    Personal goal for participation: To concentrate on selected task    Goal orientation: social    Group therapy participation: active    Therapeutic interventions reviewed and discussed: Crafts, games, music    Impression of participation: The patient was attentive.    BEVERLY S BAKER  10/25/2012  5:13 PM

## 2012-10-25 NOTE — Behavioral Health Treatment Team (Cosign Needed)
Patient did not attend morning community group session.

## 2012-10-25 NOTE — Behavioral Health Treatment Team (Signed)
Pt has been awake since start of shift, comes to nurses station with a variety of requests, refused offer of prns, needs limit setting and redirection.

## 2012-10-25 NOTE — Behavioral Health Treatment Team (Cosign Needed)
GROUP THERAPY PROGRESS NOTE    The patient Antonio Potter is participating in Community Group.    Group time: 30 minutes    Personal goal for participation: to orient the patient to the unit.    Goal orientation: successful adoption of unit rules    Group therapy participation: minimal    Therapeutic interventions reviewed and discussed: Yes    Impression of participation: minimal    JAMES E CROSS  10/25/2012 9:53 PM

## 2012-10-25 NOTE — Behavioral Health Treatment Team (Cosign Needed)
Patient has spent most of this shift in his room. Did come to day room for meals and medication..Patient ate all of diet. Patient mostly self isolative in room. Has to be prompted to come out.  Will  Continue to monitor  With Q15 Safety Checks.

## 2012-10-25 NOTE — Behavioral Health Treatment Team (Signed)
Pt has not slept, made several trips to nurse's station complaining he could not sleep but refused all offers of several prn's. Pt denies si/hi, contracts for safety. Denies hallucinations.

## 2012-10-25 NOTE — Behavioral Health Treatment Team (Cosign Needed)
The patient Antonio Potter is participating in Relaxation Group.    Group time: 30 minutes    Personal goal for participation: Personal    Goal orientation: To learn to relax    Group therapy participation: Active    Therapeutic interventions reviewed and discussed: Yes    ETHEL BARNETT-JOHNSON  10/25/2012

## 2012-10-26 MED ADMIN — fluPHENAZine (PROLIXIN) tablet 10 mg: ORAL | @ 22:00:00 | NDC 51079048701

## 2012-10-26 MED ADMIN — fluPHENAZine (PROLIXIN) tablet 10 mg: ORAL | @ 13:00:00 | NDC 51079048701

## 2012-10-26 MED ADMIN — ibuprofen (MOTRIN) tablet 400 mg: ORAL | @ 13:00:00 | NDC 63739044210

## 2012-10-26 MED ADMIN — amLODIPine (NORVASC) tablet 5 mg: ORAL | @ 13:00:00 | NDC 51079045101

## 2012-10-26 MED FILL — FLUPHENAZINE 5 MG TAB: 5 mg | ORAL | Qty: 2

## 2012-10-26 MED FILL — IBUPROFEN 400 MG TAB: 400 mg | ORAL | Qty: 1

## 2012-10-26 MED FILL — AMLODIPINE 5 MG TAB: 5 mg | ORAL | Qty: 1

## 2012-10-26 NOTE — Behavioral Health Treatment Team (Signed)
GROUP THERAPY PROGRESS NOTE    Antonio Potter is participating in Social Work Process Group. .     Group time: 45 minutes    Personal goal for participation: To gain personal insight into recovery and to learn coping skills.     Goal orientation: personal    Group therapy participation: active    Therapeutic interventions reviewed and discussed: Self Esteem    Impression of participation: Pt was able to say positive characteristics about himself.     S. Rysinski, LCSW

## 2012-10-26 NOTE — Progress Notes (Signed)
Notified Dr Latanya Maudlin of patient status and elevated blood pressure, received a now order for norvasc 5mg  orally. Will continue to monitor.      10/26/12 2032   Vitals   Temp 96.6 ??F (35.9 ??C)   Pulse (Heart Rate) 74   Resp Rate 18   BP ! 185/94 mmHg   MAP (Calculated) 124
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
## 2012-10-26 NOTE — Behavioral Health Treatment Team (Signed)
Patient initially refused his Norvasc for high blood pressure, he was educated about the consequences of not taking blood pressure medication as ordered by medical provider and he later took the medication but refused repeat bloo pressure monitor. No sign of discomfort noted. Will continue to monitor.

## 2012-10-26 NOTE — Behavioral Health Treatment Team (Signed)
GROUP THERAPY PROGRESS NOTE    Antonio Potter is participating in Coping Skills Group.     Group time: 25 minutes    Personal goal for participation: Education    Goal orientation: personal    Group therapy participation: active    Therapeutic interventions reviewed and discussed: yes    Impression of participation: good

## 2012-10-26 NOTE — Behavioral Health Treatment Team (Cosign Needed)
Patient is alert and oriented. Patient has been visible on the unit. Patient has been participating in groups and activities. Patient has bouts of being isolative to his room. Patient did spend a good portion of the shift sitting in his room reading his Bible. Patient's affect was flat and his mood was angry and irritable. Patient has been intrusive and ease dropping into staff's conversation. Patient is preoccupied with loose thoughts while communicating with male staff. Patient has poor hygienes and he is unkempt. Patient has been meal compliant. Patient is in denial of having hypertension and he refused two prompts to take his pressure medicine. Patient later voluntarily reported to the medication room to ask for his high blood pressure medicine. Patient currently denies SI/HI and A/V hallucinations. Encourage patient to perform personal hygiene care. Continue to monitor patient for safety and needs Q 15 minutes.

## 2012-10-26 NOTE — Progress Notes (Signed)
Problem: Altered Thought Process (Adult/Pediatric)  Goal: *STG: Attends activities and groups  Outcome: Progressing Towards Goal  Pt has been visible on the unit.  Mood has been irritable and pt can be demanding.  He also appears to be preoccupied and thoughts are loose at times.  He currently denies suicidal/homicidal ideations.  Pt has been meal/medication compliant and attends/participates in groups.  He was encouraged to continue to follow directions.  Staff will monitor pt for safety and needs.

## 2012-10-26 NOTE — Behavioral Health Treatment Team (Signed)
Pt rested quietly in bed with eyes closed until 0400.  No signs/symptoms of distress.  Will monitor for changes.  Q 15 minute checks continue.  2 hour chart audit done. Slept  5   hours since 2200

## 2012-10-27 MED ADMIN — LORazepam (ATIVAN) tablet 1 mg: ORAL | @ 04:00:00 | NDC 68084041311

## 2012-10-27 MED ADMIN — amLODIPine (NORVASC) tablet 5 mg: ORAL | @ 02:00:00 | NDC 51079045101

## 2012-10-27 MED ADMIN — amLODIPine (NORVASC) tablet 5 mg: ORAL | @ 13:00:00 | NDC 51079045101

## 2012-10-27 MED ADMIN — fluPHENAZine (PROLIXIN) tablet 10 mg: ORAL | @ 22:00:00 | NDC 51079048701

## 2012-10-27 MED ADMIN — LORazepam (ATIVAN) tablet 1 mg: ORAL | @ 13:00:00 | NDC 63739050010

## 2012-10-27 MED ADMIN — fluPHENAZine (PROLIXIN) tablet 10 mg: ORAL | @ 13:00:00 | NDC 51079048701

## 2012-10-27 MED FILL — FLUPHENAZINE 5 MG TAB: 5 mg | ORAL | Qty: 2

## 2012-10-27 MED FILL — AMLODIPINE 5 MG TAB: 5 mg | ORAL | Qty: 1

## 2012-10-27 MED FILL — LORAZEPAM 1 MG TAB: 1 mg | ORAL | Qty: 1

## 2012-10-27 NOTE — Behavioral Health Treatment Team (Signed)
Pt had been restless at beginning of shift, up to desk several times with complaints of not being able to sleep, pt has many requests but does not think any suggestions offered to him will help him with his concerns. Pt refused prn for sleep and stated I'm feeling a lot of energy, pt accepted prn ativan for anxiety and is now resting in bed, still unable to sleep but less restless.
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
## 2012-10-27 NOTE — Behavioral Health Treatment Team (Signed)
GROUP THERAPY PROGRESS NOTE    Antonio Potter is participating in [place].     Group time: 20 minutes    Personal goal for participation: attendance    Goal orientation: community    Group therapy participation: minimal    Therapeutic interventions reviewed and discussed:     Impression of participation: fair

## 2012-10-27 NOTE — Behavioral Health Treatment Team (Signed)
Patient focused on discharge. Remains sexually and religiously preoccupied. Approaching staff and very argumentative and believing he has been treated unfairly because he has not been discharged. Disrespectful to male staff. He believes men should only tell him what to do. Redirected numerous times and limit set with minimal improvement. Will continue to set limits and redirect. Will continue to monitor pt and behavior.

## 2012-10-27 NOTE — Behavioral Health Treatment Team (Cosign Needed)
GROUP THERAPY PROGRESS NOTE    Antonio Potter is participating in Leisure-[employer]ive Group.     Group time: 1.5 hour    Personal goal for participation: to work on selected tasks    Goal orientation: social    Group therapy participation: minimal    Therapeutic interventions reviewed and discussed: yes    Impression of participation: needed prompting

## 2012-10-27 NOTE — Behavioral Health Treatment Team (Signed)
Pt has been up to nurse's station several times with c/o blurred vision, demanding someone do something since he's in a hospital then admitted he usually wears glasses but doesn't have them here, when pt was redirected to his room and attempted to intimidate staff and stated why are you afraid of me? Pt required firm limits to return to his room.

## 2012-10-27 NOTE — Behavioral Health Treatment Team (Signed)
The patient Antonio Potter a 52 y.o. male has been visible on the unit. He remains entitled, attempting to intimidate staff, intrusive, manipulative., and hostile. He denies suicidal and homicidal Ideation and verbally contracted for safety. Complaint with meals and medication. Patient denies audio / visual hallucination. Patient appears to be responding to internal stimuli. Affect is flat . Patient  Interact well with peers. Patient refused po medication at times. Patient has not had any angry outbursts on this shift. Talked with patient and patient response was impressive but needs reinforcement. Will continue to monitor.

## 2012-10-27 NOTE — Progress Notes (Signed)
Problem: Altered Thought Process (Adult/Pediatric)  Goal: *STG: Participates in treatment plan  Outcome: Progressing Towards Goal  Client participated in treatment team, very pleasant and appropriate. Then later burst in room while having treatment team with another patient c/o not being discharged and treated unfairly. Demanding to know why psychiatrist spending longer time with another patient than with him. Also requesting grievance. Presents with labile mood and needed to be redirected from treatment team twice after he had been seen. Seemed very agitated, needed firm limit setting. Patient also attempted to come in nursing station after being redirected from treatment team. Staff will continue checks, set limits and anticipate needs.

## 2012-10-27 NOTE — Behavioral Health Treatment Team (Cosign Needed)
Pt attend goals group.

## 2012-10-27 NOTE — Progress Notes (Signed)
Patient is sexually inappropriate to this writer stating, "when I get out, can I date you for a year."  Patient has been redirected on several occasions.  We have advised patient to be therapeutic with staff and not inappropriate.

## 2012-10-27 NOTE — Behavioral Health Treatment Team (Signed)
No signs/symptoms of distress.  Will monitor for changes.  Q 15 minute checks continue.  2 hour chart audit done. Slept  4   hours since 2200

## 2012-10-28 MED ADMIN — fluPHENAZine (PROLIXIN) tablet 10 mg: ORAL | @ 22:00:00 | NDC 51079048701

## 2012-10-28 MED ADMIN — fluPHENAZine (PROLIXIN) tablet 10 mg: ORAL | @ 13:00:00 | NDC 51079048701

## 2012-10-28 MED ADMIN — amLODIPine (NORVASC) tablet 5 mg: ORAL | @ 13:00:00 | NDC 51079045101

## 2012-10-28 MED ADMIN — clonazePAM (KLONOPIN) tablet 0.25 mg: ORAL | @ 22:00:00 | NDC 63739026310

## 2012-10-28 MED ADMIN — zolpidem (AMBIEN) tablet 10 mg: ORAL | @ 05:00:00 | NDC 51079072401

## 2012-10-28 MED FILL — CLONAZEPAM 0.5 MG TAB: 0.5 mg | ORAL | Qty: 1

## 2012-10-28 MED FILL — FLUPHENAZINE 5 MG TAB: 5 mg | ORAL | Qty: 2

## 2012-10-28 MED FILL — ZOLPIDEM 5 MG TAB: 5 mg | ORAL | Qty: 2

## 2012-10-28 MED FILL — AMLODIPINE 5 MG TAB: 5 mg | ORAL | Qty: 1
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
## 2012-10-28 NOTE — Progress Notes (Signed)
Recommendations: cont reg diet as tolerated.  Pt noted to be meal compliant, may need double portions to meet ~ needs.  PMH unremarkable per nutrition  Ht: 6'  Wt: 350 lb  BMI 47.46 kg/(m^2) c/w obesity grade III  Est energy needs: 1945 kcal, 70 g protein, 2200 mL fluids  RD following.

## 2012-10-28 NOTE — Behavioral Health Treatment Team (Signed)
GROUP THERAPY PROGRESS NOTE    Antonio Potter is participating in Community.     Group time: 30 minutes    Personal goal for participation: daily orientation    Goal orientation: personal    Group therapy participation: active    Therapeutic interventions reviewed and discussed: yes    Impression of participation: cooperative

## 2012-10-28 NOTE — Progress Notes (Signed)
Responded to Request from Mr. Borchers for a visit from a [address].  He shared he had a question about a passage in [place].  Explored a few passages before and after the two verses he was asking about.  He also asked for prayer for him when he is discharged.  Provided spiritual presence and prayer.    Visited by: Encarnacion Slates [facility]  Chaplain Paging Service [phone])

## 2012-10-28 NOTE — Behavioral Health Treatment Team (Signed)
Pt requesting sleeping pill, medicated with ambien 10 mg po

## 2012-10-28 NOTE — Behavioral Health Treatment Team (Signed)
Pt visible on the unit, meals and meds compliant, pt interacted with select peers appropriately and 1 select male peer whom he appeared to guide and influence to follow him by writing complaints about staff, flirting and being inappropriate with male peer.  Pt approached this Clinical research associate being apologetic for his behaviors and thanking me for getting a bible when he needed it. Pt verbalize no new self disclosures, no S/I H/I or A/H reported at this time.  Pt remains on Q 15 min checks for safety, will monitor and offer support daily.

## 2012-10-28 NOTE — Behavioral Health Treatment Team (Signed)
Sleeping medication was effective. Pt noted to have rested with eyes closed for approximately 6 hours. Maintained on q6min checks. Will continue to monitor.

## 2012-10-28 NOTE — Behavioral Health Treatment Team (Signed)
Pt attend goals group.

## 2012-10-28 NOTE — Progress Notes (Signed)
Problem: Altered Thought Process (Adult/Pediatric)  Goal: *STG: Demonstrates ability to understand and use improved judgment in daily activities and relationships  Outcome: Progressing Towards Goal  Pt is oriented to Person, Place and Time. Pt affect labile and mood irritable. Speech shows no evidence of impairment. Pt  denies SI/HI. Pt denies A/V hallucinations. Pt observed in the milieu with interactions with peers. No delusional thinking noted. Pt compliant with all meds. Will continue to monitor pt for safety per hospital protocol. Will continue to encourage pt to participate in group therapy.

## 2012-10-29 MED ADMIN — zolpidem (AMBIEN) tablet 10 mg: ORAL | @ 01:00:00 | NDC 51079072401

## 2012-10-29 MED ADMIN — ibuprofen (MOTRIN) tablet 400 mg: ORAL | @ 07:00:00 | NDC 63739044210

## 2012-10-29 MED ADMIN — fluPHENAZine (PROLIXIN) tablet 10 mg: ORAL | @ 13:00:00 | NDC 51079048701

## 2012-10-29 MED ADMIN — amLODIPine (NORVASC) tablet 5 mg: ORAL | @ 13:00:00 | NDC 51079045101

## 2012-10-29 MED ADMIN — clonazePAM (KLONOPIN) tablet 0.25 mg: ORAL | @ 13:00:00 | NDC 63739026310

## 2012-10-29 MED ADMIN — LORazepam (ATIVAN) tablet 1 mg: ORAL | @ 08:00:00 | NDC 68084041311

## 2012-10-29 NOTE — Behavioral Health Treatment Team (Signed)
Prn medication was effective. No further c/o pain and pt was able to rest with eyes closed for approximately 5 hours. Will continue to monitor.

## 2012-10-29 NOTE — Discharge Summary (Signed)
Patient seen, staffing held and discharge summary to be dictated by dr. Nolon Rod.   Please see dictated report for full details. Patient stable for discharge and considered to be at low risk of harm to self or others.  Informed consent given to the use of discharge medications.  Treatment rounds held in full.  I have spent greater than 35 minutes on discharge work.

## 2012-10-29 NOTE — Behavioral Health Treatment Team (Cosign Needed)
GROUP THERAPY PROGRESS NOTE    Antonio Potter is participating in Goals group.     Group time: 30 minutes    Personal goal for participation: to orient daily goal.    Goal orientation: personal    Group therapy participation: active    Therapeutic interventions reviewed and discussed: yes    Impression of participation: no problem

## 2012-10-29 NOTE — Behavioral Health Treatment Team (Signed)
Social Work  Discharge noted for today.  The patient is aware of the plan and states readiness.  He denies current s/h's a/v's.  Thought process is clear and goal directed.    The patient has accepted placement [facility].  Out patient follow up will be provided by [facility].  Mr Antonio Potter will provide transportation to the Shelter.

## 2012-10-29 NOTE — Behavioral Health Treatment Team (Signed)
Pt remains restless. In and out of his room. C/o anxiety. Was medicated with Ativan po prn. Returned to room to rest. Will continue to monitor.

## 2012-10-29 NOTE — Behavioral Health Treatment Team (Signed)
Pt requesting ibuprofen for back pain, medicated with ibuprofen 400 mg po

## 2012-10-29 NOTE — Behavioral Health Treatment Team (Cosign Needed)
GROUP THERAPY PROGRESS NOTE    The patient Antonio Potter a 52 y.o. male is participating in Coping Skills Group.     Group time: 45 minutes    Personal goal for participation: To participate in relaxation activity    Goal orientation:  relaxation    Group therapy participation: active    Therapeutic interventions reviewed and discussed: favorite ways to relax    Impression of participation:  The patient was attentive.    BEVERLY S BAKER  10/29/2012  1:01 PM

## 2012-10-29 NOTE — Behavioral Health Treatment Team (Addendum)
Patient has been alert and visible on the unit,patient has been oriented x3,patient has been cooperative has requested to see the chaplain.all belongings have been returned to him and he has been very social with peer, patient denies S/H ideation and has been encouraged to make staff aware of needs.we will continue to monitor for safety. Patient will be walked downstairs with staff all of his belongings have been returned, we will continue to monitor for safety per hospital protocol.

## 2012-10-29 NOTE — Behavioral Health Treatment Team (Cosign Needed)
GROUP THERAPY PROGRESS NOTE    Antonio Potter is participating in [employer].     Group time: 30 minutes    Personal goal for participation:  Unit orientation    Goal orientation: Community    Group therapy participation: active    Therapeutic interventions reviewed and discussed: Yes    Impression of participation: good

## 2012-11-05 NOTE — Discharge Summary (Signed)
Name:       Antonio Potter, Antonio Potter                  Admitted:    10/19/2012                                               Discharged:  10/29/2012  Account #:  [id number]                     DOB:         [birth year]  Consultant: Gillermina Hu, MD         Age          52                                 DISCHARGE SUMMARY      The patient seen as part of a treatment team.    ATTENDING PHYSICIAN: Everardo Beals. Andria Meuse, MD    DISCHARGE DIAGNOSES  AXIS I  1. Schizophrenia, undifferentiated, chronic with acute exacerbation.  2. Treatment noncompliant.  AXIS II: Antisocial traits.  AXIS III: Obesity, back pain, bilateral knee pain.  AXIS IV: Lack of primary support, homelessness, lack of structure.  AXIS V: 20 at time of admission, 50-55 at time of discharge.    HISTORY OF PRESENT ILLNESS: Please see initial psychiatric evaluation in  chart by writer dated 10/20/2012, for full details, as well as description  of mental status examination at time of presentation.    HOSPITALIZATION COURSE: The patient was admitted to the inpatient  psychiatric unit for an acute exacerbation of psychosis. While  hospitalized, the patient was started on antipsychotic. The patient  initially refusing to engage in treatment. Court-ordered med petition was  granted. The patient was placed on court-ordered medications. The patient  with slow response to medication overall. The patient with long history of  treatment noncompliance. The patient ultimately with some positive response  to treatment, including decreased delusions and hallucinations. The patient  with overall improved mood. The patient ready for discharge. The patient  noted to be free of suicidal or homicidal ideation. The patient maximized  benefit of inpatient hospitalization. The patient able to contract for  safety outside of the hospital. The patient considered stable for  discharge, and considered to be a low risk of harm to self or others.    DISCHARGE MEDICATIONS  1. Norvasc 5 mg  daily.  2. Prolixin 10 mg b.i.d.  3. Albuterol p.r.n.    DISPOSITION: Home or self-care.    PROGNOSIS: The patient's prognosis is considered limited to poor. The  patient with long history of treatment noncompliance. The patient will need  to remain on current medications in order to maximize benefit gained over  course of hospital stay. The patient will also need to follow up with  outpatient provider as scheduled at time of discharge.    The patient was seen on rounds and case discussed with Dr. Andria Meuse, who is  in agreement with plan of care.            DICTATED BY: Renda Rolls, NP              Gillermina Hu, MD    cc:    Gillermina Hu, MD  BRS/wmx; D: 11/05/2012 09:25 A; T: 11/05/2012 10:19 A; DOC# [id number]; Job#  [id number]

## 2012-11-05 NOTE — Progress Notes (Signed)
Discharge Summary dictated [id number].

## 2013-06-27 DIAGNOSIS — F209 Schizophrenia, unspecified: Secondary | ICD-10-CM

## 2013-06-27 LAB — URINALYSIS W/ REFLEX CULTURE
Bacteria: NEGATIVE /hpf
Bilirubin: NEGATIVE
Blood: NEGATIVE
Glucose: NEGATIVE mg/dL
Ketone: NEGATIVE mg/dL
Leukocyte Esterase: NEGATIVE
Nitrites: NEGATIVE
Protein: NEGATIVE mg/dL
Specific gravity: 1.018 (ref 1.003–1.030)
Urobilinogen: 0.2 EU/dL (ref 0.2–1.0)
pH (UA): 5 (ref 5.0–8.0)

## 2013-06-27 LAB — DRUG SCREEN, URINE
AMPHETAMINES: NEGATIVE
BARBITURATES: NEGATIVE
BENZODIAZEPINES: NEGATIVE
COCAINE: NEGATIVE
METHADONE: NEGATIVE
OPIATES: NEGATIVE
PCP(PHENCYCLIDINE): NEGATIVE
THC (TH-CANNABINOL): NEGATIVE

## 2013-06-27 LAB — CBC WITH AUTOMATED DIFF
ABS. BASOPHILS: 0 10*3/uL (ref 0.0–0.1)
ABS. EOSINOPHILS: 0.2 10*3/uL (ref 0.0–0.4)
ABS. LYMPHOCYTES: 1.8 10*3/uL (ref 0.8–3.5)
ABS. MONOCYTES: 0.7 10*3/uL (ref 0.0–1.0)
ABS. NEUTROPHILS: 6 10*3/uL (ref 1.8–8.0)
BASOPHILS: 0 % (ref 0–1)
EOSINOPHILS: 2 % (ref 0–7)
HCT: 36.7 % (ref 36.6–50.3)
HGB: 11.5 g/dL — ABNORMAL LOW (ref 12.1–17.0)
LYMPHOCYTES: 21 % (ref 12–49)
MCH: 28.9 PG (ref 26.0–34.0)
MCHC: 31.3 g/dL (ref 30.0–36.5)
MCV: 92.2 FL (ref 80.0–99.0)
MONOCYTES: 8 % (ref 5–13)
NEUTROPHILS: 69 % (ref 32–75)
PLATELET: 259 10*3/uL (ref 150–400)
RBC: 3.98 M/uL — ABNORMAL LOW (ref 4.10–5.70)
RDW: 15.7 % — ABNORMAL HIGH (ref 11.5–14.5)
WBC: 8.7 10*3/uL (ref 4.1–11.1)

## 2013-06-27 LAB — METABOLIC PANEL, COMPREHENSIVE
A-G Ratio: 0.8 — ABNORMAL LOW (ref 1.1–2.2)
ALT (SGPT): 21 U/L (ref 12–78)
AST (SGOT): 25 U/L (ref 15–37)
Albumin: 2.9 g/dL — ABNORMAL LOW (ref 3.5–5.0)
Alk. phosphatase: 80 U/L (ref 45–117)
Anion gap: 2 mmol/L — ABNORMAL LOW (ref 5–15)
BUN/Creatinine ratio: 25 — ABNORMAL HIGH (ref 12–20)
BUN: 17 MG/DL (ref 6–20)
Bilirubin, total: 0.3 MG/DL (ref 0.2–1.0)
CO2: 32 mmol/L (ref 21–32)
Calcium: 8.2 MG/DL — ABNORMAL LOW (ref 8.5–10.1)
Chloride: 105 mmol/L (ref 97–108)
Creatinine: 0.67 MG/DL (ref 0.45–1.15)
GFR est AA: >60 mL/min/{1.73_m2} (ref >60)
GFR est non-AA: >60 mL/min/{1.73_m2} (ref >60)
Globulin: 3.6 g/dL (ref 2.0–4.0)
Glucose: 88 mg/dL (ref 65–100)
Potassium: 4.2 mmol/L (ref 3.5–5.1)
Protein, total: 6.5 g/dL (ref 6.4–8.2)
Sodium: 139 mmol/L (ref 136–145)

## 2013-06-27 LAB — EKG, 12 LEAD, INITIAL
Atrial Rate: 67 {beats}/min
Calculated P Axis: 51 degrees
Calculated R Axis: -17 degrees
Calculated T Axis: 42 degrees
P-R Interval: 148 ms
Q-T Interval: 390 ms
QRS Duration: 90 ms
QTC Calculation (Bezet): 412 ms
Ventricular Rate: 67 {beats}/min

## 2013-06-27 LAB — SALICYLATE: Salicylate level: <1.7 MG/DL — ABNORMAL LOW (ref 2.8–20.0)

## 2013-06-27 LAB — ACETAMINOPHEN: Acetaminophen level: <2 ug/mL — ABNORMAL LOW (ref 10–30)

## 2013-06-27 LAB — ETHYL ALCOHOL: ALCOHOL(ETHYL),SERUM: <10 MG/DL (ref <10)

## 2013-06-27 MED ADMIN — sodium chloride 0.9 % bolus infusion 1,000 mL: INTRAVENOUS | @ 18:00:00 | NDC 00409798309

## 2013-06-27 MED FILL — SODIUM CHLORIDE 0.9 % IV: INTRAVENOUS | Qty: 1000
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
## 2013-06-27 NOTE — ED Provider Notes (Signed)
HPI Comments: 53 yo M with h/o schizophrenia brought to ED after he reportedly told bystander at local bus station that he felt ill. EMS and police arrived to bus station and pt non-verbal at that point. No further history obtained. Pt responds to verbal commands and painful stimuli per EMS report, but otherwise non-communicative. Unable to obtain further history from patient on arrival.     Patient is a 53 y.o. male presenting with mental health disorder.   Mental Health Problem          History reviewed. No pertinent past medical history.     History reviewed. No pertinent past surgical history.      History reviewed. No pertinent family history.     History     Social History   ??? Marital Status: SINGLE     Spouse Name: N/A     Number of Children: N/A   ??? Years of Education: N/A     Occupational History   ??? Not on file.     Social History Main Topics   ??? Smoking status: Unknown If Ever Smoked   ??? Smokeless tobacco: Not on file   ??? Alcohol Use: Not on file   ??? Drug Use: Not on file   ??? Sexual Activity: Not on file     Other Topics Concern   ??? Not on file     Social History Narrative                  ALLERGIES: Review of patient's allergies indicates no known allergies.      Review of Systems   Unable to perform ROS      Filed Vitals:    06/27/13 1307   BP: 143/78   Pulse: 74   Temp: 97.5 ??F (36.4 ??C)   Resp: 20   Height: 6' (1.829 m)   Weight: 158.759 kg (350 lb)   SpO2: 97%            Physical Exam   Constitutional: He appears well-developed and well-nourished. No distress.   HENT:   Head: Normocephalic and atraumatic.   Eyes: Conjunctivae and EOM are normal. Pupils are equal, round, and reactive to light.   Neck: Normal range of motion. Neck supple.   Well healed tracheostomy scar   Cardiovascular: Normal rate, regular rhythm, normal heart sounds and intact distal pulses.  Exam reveals no gallop and no friction rub.    No murmur heard.  Pulmonary/Chest: Effort normal and breath sounds normal. No respiratory  distress. He has no wheezes. He has no rales. He exhibits no tenderness.   Abdominal: Soft. Bowel sounds are normal. He exhibits no distension. There is no tenderness. There is no guarding.   Musculoskeletal: Normal range of motion. He exhibits no edema or tenderness.   Lymphadenopathy:     He has no cervical adenopathy.   Neurological: He has normal reflexes.   Moving all four extremities without difficulty and with full strength. Eyes open spontaneously pupils 3mm and reactive, EOMI, no facial drop, normal stone. Nonverbal   Skin: Skin is warm and dry. No rash noted.   Psychiatric: He has a normal mood and affect. His behavior is normal.   Nursing note and vitals reviewed.       MDM  Number of Diagnoses or Management Options  Diagnosis management comments: 53 yo arrives by EMS. Pt nonverbal on arrival but alert. Follows commands, for example pt urinated in urinal when told he would need straight cath.  No focal neuro deficits. Vitals reassuring. Labs also reassuring with normal CXR. No evidence of UTI. Suspect catatonia related to underlying schizophrenia. Bsmart consulted. Signed out to Dr. Gregor Hams awaiting lab results and Bsmart recommendations.       Procedures

## 2013-06-27 NOTE — ED Notes (Signed)
[facility]; Patient unaware. Patient currently cooperative, reading bible in ED stretcher while eating nutritional tray. VSS, no acute distress. Bed low and locked. Siderails up. Call bell within reach. Will continue to monitor

## 2013-06-27 NOTE — ED Notes (Signed)
Patient is currently sleeping in ED stretcher. VSS, no acute distress. Bed low and locked. Siderails up. Call bell within reach. Waiting for petition to be filed and bed assignment in order to [facility].

## 2013-06-27 NOTE — ED Notes (Signed)
Pt is now talking to staff

## 2013-06-27 NOTE — Other (Signed)
TRANSFER - OUT REPORT:    Verbal report given to Edna, RN(name) [facility] for routine progression of care       Report consisted of patient???s Situation, Background, Assessment and   Recommendations(SBAR).     Information from the following report(s) SBAR, ED Summary, [facility] and Recent Results was reviewed with the receiving nurse.    Opportunity for questions and clarification was provided.

## 2013-06-27 NOTE — ED Notes (Signed)
Attempted to call report. Asked to call back in 10 minutes after Bed Placement places orders.

## 2013-06-27 NOTE — ED Notes (Signed)
[place] in department to serve TDO paperwork.

## 2013-06-27 NOTE — ED Notes (Signed)
Patient reports left leg pain that radiates into left hip, "sometimes it moves over to my right leg, but right now it's just the left". MD aware.

## 2013-06-27 NOTE — Behavioral Health Treatment Team (Signed)
New admission under TDO status. Presents as loud and manic. 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 Pt religiously preoccupied. All his answers during admission assessment pertained to God. Denies SI and HI. Denies hallucinations. Denies SA issues. Has medical history of hypertension. Has been non compliant with his medications. Is currently homeless. Body and belongings search was done. Placed on q3515min checks for safety.

## 2013-06-27 NOTE — ED Notes (Signed)
[facility] at bedside.
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
## 2013-06-27 NOTE — ED Notes (Signed)
Pt still not communicating, pt told that if he was unable to provide urine sample that he would have to be straight cathed urinal placed in pt's hand, pt was able to provide a urine sample into the urinal.

## 2013-06-28 ENCOUNTER — Inpatient Hospital Stay
Admit: 2013-06-28 | Discharge: 2013-07-01 | Disposition: A | Discharge status: Home / Self Care | Payer: Medicare (Managed Care) | Source: Other Acute Inpatient Hospital | Attending: Geriatric Psychiatry | Admitting: Geriatric Psychiatry

## 2013-06-28 MED ADMIN — ibuprofen (MOTRIN) tablet 400 mg: ORAL | @ 14:00:00 | NDC 63739067210

## 2013-06-28 MED ADMIN — therapeutic multivitamin (THERAGRAN) tablet 1 Tab: ORAL | @ 17:00:00

## 2013-06-28 MED ADMIN — ibuprofen (MOTRIN) tablet 400 mg: ORAL | @ 03:00:00 | NDC 63739067210

## 2013-06-28 MED FILL — THERA 400 MCG TABLET: 400 mcg | ORAL | Qty: 1

## 2013-06-28 MED FILL — IBUPROFEN 400 MG TAB: 400 mg | ORAL | Qty: 1

## 2013-06-28 NOTE — Behavioral Health Treatment Team (Signed)
GROUP THERAPY PROGRESS NOTE    The patient Antonio Potter is participating in Community Group.    Group time: 30 minutes    Personal goal for participation: to orient the patient to the unit.    Goal orientation: successful adoption of unit rules    Group therapy participation: minimal    Therapeutic interventions reviewed and discussed: Yes    Impression of participation: minimal    Donia AstJames E Cross  06/28/2013 1:08 PM

## 2013-06-28 NOTE — Progress Notes (Signed)
Problem: Altered Thought Process (Adult/Pediatric)  Goal: *STG: Participates in treatment plan  Outcome: Progressing Towards Goal  Patient participated in treatment team, presents as hyper verbal, manic with loud speech. Noted when psychiatrist trying to talk to him patient repeatedly trying to talk over him.  Presents as hyper with loud speech, rambling and religiously preoccupied. Patient admits he came to [place] from [place] to visit his mother. Noted rambling, presents as religiously preoccupied. States he left from the bus station and went to the detention center to look for a man named Hotel managerBill Cosby and another woman because he wanted to do missionary work. States receives messages from God everyday and receives visions from him. States he does not believe he needs medication and is not willing to take meds at this time. Denies feelings of self harm to self and others. Continue 15min checks, assess thought processes and encourage med compliance

## 2013-06-28 NOTE — Behavioral Health Treatment Team (Signed)
Pt rested approximately 5 hours with eyes closed. Up early. Needed redirection from staff that it was not time to be up out in the hallway. Pt was compliant with redirection. Maintained on q415min checks for safety.

## 2013-06-28 NOTE — Behavioral Health Treatment Team (Signed)
The patient Antonio Potter a 53 y.o. male has been visible on the unit.patient has been entitled, intrusive, flirtatious, manipulative and religiously preoccupied.  He denies suicidal and homicidal Ideation and verbally contracted for safety. Complaint with meals and medication. Patient denies audio / visual hallucination. Patient appears to be responding to internal stimuli. Affect is labile. Patient interact well with peers. Patient refused po medication at times. Patient has not had any angry outbursts on this shift. Patient followed redirections. Will continue.

## 2013-06-28 NOTE — Behavioral Health Treatment Team (Signed)
GROUP THERAPY PROGRESS NOTE    Antonio Potter is participating in Goals group.     Group time: 30 minutes    Personal goal for participation: to orient daily goal.    Goal orientation: personal    Group therapy participation: minimal    Therapeutic interventions reviewed and discussed: yes    Impression of participation: no problem
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
## 2013-06-28 NOTE — Progress Notes (Signed)
EDUCATIONAL GROUP NOTE    The patient Antonio Potter is participating in Medication educational Teaching Group.     Group time: 30 minutes    Personal goal for participation: To review signs and symptoms of relapse; side effects    Goal orientation: personal    Group therapy participation: active    Therapeutic interventions reviewed and discussed: Yes    Impression of participation: good; verbalized understanding    Orland Mustardssence L Johnson, RN  06/28/2013

## 2013-06-28 NOTE — Behavioral Health Treatment Team (Signed)
INITIAL PSYCHIATRIC EVALUATION          IDENTIFICATION:    Patient Name  Antonio Potter   Date of Birth [birth year]   CSN [id number]   Medical Record Number  [id number]      Age  53 y.o.   PCP None   Admit date:  06/27/2013    Room Number  310/02  @ [place] [facility]   Date of Service  06/28/2013            HISTORY         REASON FOR HOSPITALIZATION:  CC: Pt admitted under a temporary detention order (TDO) ,severe psychosis and mania proving to be an imminent danger to self and others and an inability to care for self.      HISTORY OF PRESENT ILLNESS:    The patient Antonio Potter is a 53 y.o.  male with a past psychiatric history of schizophrenia  who presents at this time for an exacerbation of psychotic symptoms . Patient reports/evidences the following emotional symptoms:  agitation, delusions, paranoid behavior and psychosis.  The symptoms have been present for few days and are of  severity. The symptoms are constant in nature.  Additional symptoms include agitation, anger outbursts and financial problems . Condition precipitated by  and psychosocial stressors homeless  Condition  treatment noncompliance.  BAL=0.  Patient is not a reliable historian. From his medical record prescreening it was reported that he was causing disturbance at the store on [facility] after getting off bus.Some one informed the police and he was brought to the ER. 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     ALLERGIES:  No Known Allergies   MEDICATIONS PRIOR TO ADMISSION:  Prescriptions prior to admission   Medication Sig   ??? amLODIPine (NORVASC) 5 mg tablet Take 1 Tab by mouth daily.   ??? albuterol  (PROVENTIL HFA, VENTOLIN HFA) 90 mcg/actuation inhaler Take 2 Puffs by inhalation every four (4) hours as needed for Wheezing or Shortness of Breath.   ??? fluPHENAZine (PROLIXIN) 10 mg tablet Take 1 Tab by mouth two (2) times a day.      PAST MEDICAL HISTORY:  Active Ambulatory Problems     Diagnosis Date Noted   ??? Psychosis 10/19/2012   ??? Altered thought processes 10/19/2012     Resolved Ambulatory Problems     Diagnosis Date Noted   ??? No Resolved Ambulatory Problems     No Additional Past Medical History    No past medical history on file.No past surgical history on file.   SOCIAL HISTORY:  Homeless. 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 Smoking status: Unknown If Ever Smoked   ??? Smokeless tobacco: Not on file   ??? Alcohol Use: Not on file      FAMILY HISTORY:  History reviewed. No pertinent family history.No family history on file.    REVIEW of SYSTEMS:   Psychological ROS: positive for - hostility, irritability and  mood swings  Respiratory ROS: no cough, shortness of breath, or wheezing  Cardiovascular ROS: no chest pain or dyspnea on exertion  All other Systems reviewed and are considered negative.           MENTAL STATUS EXAM & VITALS       MENTAL STATUS EXAM:    FINDINGS WITHIN NORMAL LIMITS (WNL) UNLESS OTHERWISE STATED BELOW:    Orientation Alert and Oriented x 3   Vital Signs (BP,Pulse, Temp) See below (reviewed)   Gait and Station Within normal limits   Abnormal Muscular Movements/Tone/Behavior No EPS, no Tardive Dyskinesia, no abnormal muscular movements; wnl tone   Relations uncooperative and unreliable   General Appearance:  age appropriate, casually dressed and overweight   Language No aphasia or dysarthria   Speech:  hyperverbal, loud and pressured   Thought Processes logical, wnl rate of thoughts, good abstract reasoning and computation   Thought Associations flight  of ideas, illogical and disorganized   Thought Content free of delusions   Suicidal Ideations no plan  and no intention   Homicidal Ideations no plan  and no intention   Mood:  irritable and labile   Affect:  hostile, inappropriate and irritable   Memory recent  adequate   Memory remote:  adequate   Concentration/Attention:  adequate   Fund of Knowledge Fair/average   Insight:  limited   Reliability poor   Judgment:  poor          VITALS:     Patient Vitals for the past 24 hrs:   Temp Pulse Resp BP   06/28/13 1658 - 85 18 150/94 mmHg   06/28/13 0542 98.2 ??F (36.8 ??C) 85 18 160/87 mmHg              DATA     LABORATORY DATA:  Labs Reviewed - No data to display  Admission on 06/27/2013, Discharged on 06/27/2013   Component Date Value Ref Range Status   ??? Color 06/27/2013 YELLOW/STRAW   Final   ??? Appearance 06/27/2013 CLEAR  CLEAR   Final   ??? Specific gravity 06/27/2013 1.018  1.003 - 1.030   Final   ??? pH (UA) 06/27/2013 5.0  5.0 - 8.0   Final   ??? Protein 06/27/2013 NEGATIVE   NEG mg/dL Final   ??? Glucose 06/27/2013 NEGATIVE   NEG mg/dL Final   ??? Ketone 06/27/2013 NEGATIVE   NEG mg/dL Final   ??? Bilirubin 06/27/2013 NEGATIVE   NEG   Final   ??? Blood 06/27/2013 NEGATIVE   NEG   Final   ??? Urobilinogen 06/27/2013 0.2  0.2 - 1.0 EU/dL Final   ??? Nitrites 06/27/2013 NEGATIVE   NEG   Final   ??? Leukocyte Esterase 06/27/2013 NEGATIVE   NEG   Final   ??? UA:UC IF INDICATED 06/27/2013 CULTURE NOT INDICATED BY UA RESULT  CNI   Final   ??? WBC 06/27/2013 0-4  0 - 4 /hpf Final   ??? RBC 06/27/2013 0-5  0 - 5 /hpf Final   ??? Epithelial cells 06/27/2013 FEW  FEW /lpf Final   ??? Bacteria 06/27/2013 NEGATIVE   NEG /hpf Final   ??? Hyaline Cast 06/27/2013 0-2  0 - 2 Final   ??? Ventricular Rate 06/27/2013 67   Final   ??? Atrial Rate 06/27/2013 67   Final   ??? P-R Interval 06/27/2013 148   Final   ??? QRS Duration 06/27/2013 90   Final   ??? Q-T Interval 06/27/2013  390   Final   ??? QTC Calculation (Bezet) 06/27/2013 412   Final   ??? Calculated P Axis 06/27/2013  51   Final   ??? Calculated R Axis 06/27/2013 -17   Final   ??? Calculated T Axis 06/27/2013 42   Final   ??? Diagnosis 06/27/2013    Final                    Value:Normal sinus rhythm  Leftward axis  Nonspecific ST and T wave abnormality    No previous ECGs available  Confirmed by Elba Barman, M.D., [employer] [phone]) on 06/27/2013 3:52:03 PM     ??? AMPHETAMINE 06/27/2013 NEGATIVE   NEG   Final   ??? BARBITURATES 06/27/2013 NEGATIVE   NEG   Final   ??? BENZODIAZEPINE 06/27/2013 NEGATIVE   NEG   Final   ??? COCAINE 06/27/2013 NEGATIVE   NEG   Final   ??? METHADONE 06/27/2013 NEGATIVE   NEG   Final   ??? OPIATES 06/27/2013 NEGATIVE   NEG   Final   ??? PCP(PHENCYCLIDINE) 06/27/2013 NEGATIVE   NEG   Final   ??? THC (TH-CANNABINOL) 06/27/2013 NEGATIVE   NEG   Final   ??? DRUG SCRN COMMENT 06/27/2013 (NOTE)   Final   ??? SALICYLATE 96/29/5284 <1.3* 2.8 - 20.0 MG/DL Final   ??? ACETAMINOPHEN 06/27/2013 <2* 10 - 30 ug/mL Final   ??? ALCOHOL(ETHYL),SERUM 06/27/2013 <10  <10 MG/DL Final   ??? WBC 06/27/2013 8.7  4.1 - 11.1 K/uL Final   ??? RBC 06/27/2013 3.98* 4.10 - 5.70 M/uL Final   ??? HGB 06/27/2013 11.5* 12.1 - 17.0 g/dL Final   ??? HCT 06/27/2013 36.7  36.6 - 50.3 % Final   ??? MCV 06/27/2013 92.2  80.0 - 99.0 FL Final   ??? MCH 06/27/2013 28.9  26.0 - 34.0 PG Final   ??? MCHC 06/27/2013 31.3  30.0 - 36.5 g/dL Final   ??? RDW 06/27/2013 15.7* 11.5 - 14.5 % Final   ??? PLATELET 06/27/2013 259  150 - 400 K/uL Final   ??? NEUTROPHILS 06/27/2013 69  32 - 75 % Final   ??? LYMPHOCYTES 06/27/2013 21  12 - 49 % Final   ??? MONOCYTES 06/27/2013 8  5 - 13 % Final   ??? EOSINOPHILS 06/27/2013 2  0 - 7 % Final   ??? BASOPHILS 06/27/2013 0  0 - 1 % Final   ??? ABS. NEUTROPHILS 06/27/2013 6.0  1.8 - 8.0 K/UL Final   ??? ABS. LYMPHOCYTES 06/27/2013 1.8  0.8 - 3.5 K/UL Final   ??? ABS. MONOCYTES 06/27/2013 0.7  0.0 - 1.0 K/UL Final   ??? ABS. EOSINOPHILS 06/27/2013 0.2  0.0 - 0.4 K/UL Final   ??? ABS. BASOPHILS 06/27/2013 0.0  0.0 - 0.1 K/UL Final   ??? Sodium 06/27/2013 139  136 - 145 mmol/L Final   ??? Potassium  06/27/2013 4.2  3.5 - 5.1 mmol/L Final   ??? Chloride 06/27/2013 105  97 - 108 mmol/L Final   ??? CO2 06/27/2013 32  21 - 32 mmol/L Final   ??? Anion gap 06/27/2013 2* 5 - 15 mmol/L Final   ??? Glucose 06/27/2013 88  65 - 100 mg/dL Final   ??? BUN 06/27/2013 17  6 - 20 MG/DL Final   ??? Creatinine 06/27/2013 0.67  0.45 - 1.15 MG/DL Final   ??? BUN/Creatinine ratio 06/27/2013 25* 12 - 20   Final   ??? GFR est AA 06/27/2013 >60  >60  ml/min/1.2m Final   ??? GFR est non-AA 06/27/2013 >60  >60 ml/min/1.740mFinal   ??? Calcium 06/27/2013 8.2* 8.5 - 10.1 MG/DL Final   ??? Bilirubin, total 06/27/2013 0.3  0.2 - 1.0 MG/DL Final   ??? ALT 06/27/2013 21  12 - 78 U/L Final   ??? AST 06/27/2013 25  15 - 37 U/L Final   ??? Alk. phosphatase 06/27/2013 80  45 - 117 U/L Final   ??? Protein, total 06/27/2013 6.5  6.4 - 8.2 g/dL Final   ??? Albumin 06/27/2013 2.9* 3.5 - 5.0 g/dL Final   ??? Globulin 06/27/2013 3.6  2.0 - 4.0 g/dL Final   ??? A-G Ratio 06/27/2013 0.8* 1.1 - 2.2   Final        RADIOLOGY REPORTS:    Results from Hospital Encounter encounter on 06/27/13   XR CHEST PORT   Narrative **Final Report**      ICD Codes / Adm.Diagnosis: 16951884 / Mental Health Problem    Examination:  CR CHEST PORT  - [id number] Jun 27 2013  2:03PM  Accession No:  [id number]:  Chest Pain      REPORT:  EXAM:  CR CHEST PORT    INDICATION:  Chest Pain    COMPARISON:  None.    FINDINGS: A portable AP radiograph of the chest was obtained at 1400 hours.   The patient is on a cardiac monitor. The heart size is within normal limits.   The lung volumes are slightly low and there is a linear area of opacity in   right mid zone likely scarring or discoid atelectasis. No pneumonia or   pulmonary edema. The examination is somewhat limited due to the portable   technique and the patient's body habitus.    The trachea just above the thoracic inlet appears to be narrowed   significantly bilaterally over a 1.5 cm segment. The transverse diameter of   the trachea measured possibly 5 mm in  the area of narrowing. Clinical   correlation is recommended has the patient had previous intubated or had a   tracheostomy.       IMPRESSION:  1. Apparent narrowing of the trachea just above the thoracic inlet. Clinical   correlation is suggested           Signing/Reading Doctor: TIMarlou [id number]   Approved: TIMarlou [id number] Jun 27 2013  2:08PM                                 Ct Head Wo Cont    06/27/2013   **Final Report**    ICD Codes / Adm.Diagnosis: 16706237 / Mental Health Problem   Examination:  CT HEAD WO CON  - [id number] Jun 27 2013  2:12PM Accession No:  [id number]:  Pain   REPORT: INDICATION: Pain   Exam: Noncontrast CT of the brain is performed with 5 mm collimation.  FINDINGS: There is no acute intracranial hemorrhage, mass, mass effect or  herniation. Ventricular system is normal. The gray-white matter  differentiation is well-preserved. The mastoid air cells are well  pneumatized. There is mild mucosal thickening within the visualized right  maxillary sinus. The visualized paranasal sinuses are otherwise normal.     IMPRESSION: No acute intracranial hemorrhage, mass or infarct.          Signing/Reading Doctor: TODD B. BAIRD ([phone]  Approved: TODD B. Randon Goldsmith [phone])  Jun 27 2013  2:42PM                                Xr Chest [facility]    06/27/2013   **Final Report**    ICD Codes / Adm.Diagnosis: 737106   / Mental Health Problem   Examination:  CR CHEST PORT  - [id number] - Jun 27 2013  2:03PM Accession No:  [id number] Reason:  Chest Pain   REPORT: EXAM:  CR CHEST PORT  INDICATION:  Chest Pain  COMPARISON:  None.  FINDINGS: A portable AP radiograph of the chest was obtained at 1400 hours.  The patient is on a cardiac monitor. The heart size is within normal limits.  The lung volumes are slightly low and there is a linear area of opacity in  right mid zone likely scarring or discoid atelectasis. No pneumonia or  pulmonary edema. The examination is somewhat limited due to the portable   technique and the patient's body habitus.  The trachea just above the thoracic inlet appears to be narrowed  significantly bilaterally over a 1.5 cm segment. The transverse diameter of  the trachea measured possibly 5 mm in the area of narrowing. Clinical  correlation is recommended has the patient had previous intubated or had a  tracheostomy.     IMPRESSION: 1. Apparent narrowing of the trachea just above the thoracic inlet. Clinical  correlation is suggested         Signing/Reading Doctor: Marlou Sa [phone])   Approved: Marlou Sa ([id number])  Jun 27 2013  2:08PM                                        MEDICATIONS       ALL MEDICATIONS  Current Facility-Administered Medications   Medication Dose Route Frequency   ??? therapeutic multivitamin (THERAGRAN) tablet 1 Tab  1 Tab Oral DAILY   ??? ziprasidone (GEODON) 20 mg in sterile water (preservative free) injection  20 mg IntraMUSCular BID PRN   ??? OLANZapine (ZyPREXA) tablet 5 mg  5 mg Oral Q6H PRN   ??? benztropine (COGENTIN) tablet 2 mg  2 mg Oral BID PRN   ??? benztropine (COGENTIN) injection 2 mg  2 mg IntraMUSCular Q12H PRN   ??? LORazepam (ATIVAN) injection 2 mg  2 mg IntraMUSCular Q4H PRN   ??? LORazepam (ATIVAN) tablet 1 mg  1 mg Oral Q4H PRN   ??? zolpidem (AMBIEN) tablet 10 mg  10 mg Oral QHS PRN   ??? acetaminophen (TYLENOL) tablet 650 mg  650 mg Oral Q4H PRN   ??? ibuprofen (MOTRIN) tablet 400 mg  400 mg Oral Q8H PRN   ??? magnesium hydroxide (MILK OF MAGNESIA) oral suspension 30 mL  30 mL Oral DAILY PRN   ??? nicotine (NICODERM CQ) 21 mg/24 hr patch 1 Patch  1 Patch TransDERmal DAILY PRN      SCHEDULED MEDICATIONS  Current Facility-Administered Medications   Medication Dose Route Frequency   ??? therapeutic multivitamin (THERAGRAN) tablet 1 Tab  1 Tab Oral DAILY                ASSESSMENT & PLAN        The patient Antonio Potter is a 53 y.o.  male who presents at this time for treatment of the following  diagnoses:  Patient Active Hospital Problem List:   Schizophrenia  (06/27/2013)    Assessment: On my evaluation patient is displaying manic and psychotic symptoms. Very demanding and intrusive. Denies having mental illness. His thoughts process remains disorganized,tangetialand unreliable.He can be hostile.    Plan: Patient refusing any psychotropic medications recommdations."I will not take any psych.meds"he will need court order forced medications.Use PRN for severe psychosis and unsafe behavior.Monitor for violence.             I agree with decision to admit patient. I have spoken to [facility] psychiatric assessor/ED staff regarding the nature of patients's admission at this time.    A coordinated, multidisplinary treatment team round was conducted with the patient; that includes the nurse, unit pharmcist, Catering manager all present.     The following regarding medications was addressed during rounds with patient:   the risks and benefits of the proposed medication. The patient was given the opportunity to ask questions. Informed consent given to the use of the above medications.     I will continue to adjust psychiatric and non-psychiatric medications (see above "medication" section and orders section for details) as deemed appropriate & based upon diagnoses and response to treatment.     I have reviewed admission (and previous/old) labs and medical tests in the EHR and or transferring hospital documents. I will continue to order blood tests/labs and diagnostic tests as deemed appropriate and review results as they become available (see orders for details).    I have reviewed old psychiatric and medical records available in the EHR. I Will order additional psychiatric records from other institutions to further elucidate the nature of patient's psychopathology and review once available.    I will gather additional collateral information from  friends, family and o/p treatment team to further elucidate the nature of patient's psychopathology and baselline level of  psychiatric functioning.    While on the unit [facility] will be provided with individual, milieu, occupational, group, and substance abuse therapies to address target symptoms as deemed appropriate for the individual patient.      ESTIMATED LENGTH OF STAY:    TBA       STRENGTHS:  Knowledge of medications                  DISPOSITION:    Shelter or ALF                              SIGNED:    Francesca Jewett, MD  06/28/2013

## 2013-06-29 MED ADMIN — ziprasidone (GEODON) 20 mg in sterile water (preservative free) injection: INTRAMUSCULAR | @ 08:00:00 | NDC 00409488710

## 2013-06-29 MED ADMIN — amLODIPine (NORVASC) tablet 5 mg: ORAL | @ 14:00:00 | NDC 50268008411

## 2013-06-29 MED ADMIN — ibuprofen (MOTRIN) tablet 400 mg: ORAL | @ 09:00:00 | NDC 63739067210

## 2013-06-29 MED ADMIN — ibuprofen (MOTRIN) tablet 400 mg: ORAL | @ 01:00:00 | NDC 63739067210

## 2013-06-29 MED ADMIN — therapeutic multivitamin (THERAGRAN) tablet 1 Tab: ORAL | @ 14:00:00

## 2013-06-29 MED FILL — AMLODIPINE 5 MG TAB: 5 mg | ORAL | Qty: 1

## 2013-06-29 MED FILL — IBUPROFEN 400 MG TAB: 400 mg | ORAL | Qty: 1

## 2013-06-29 MED FILL — STERILE WATER FOR INJECTION: INTRAMUSCULAR | Qty: 10

## 2013-06-29 MED FILL — THERA 400 MCG TABLET: 400 mcg | ORAL | Qty: 1

## 2013-06-29 MED FILL — VENTOLIN HFA 90 MCG/ACTUATION AEROSOL INHALER: 90 mcg/actuation | RESPIRATORY_TRACT | Qty: 8

## 2013-06-29 NOTE — Behavioral Health Treatment Team (Signed)
BEHAVIORAL HEALTH RESTRAINT/SECLUSION PROGRESS NOTE TERMINATION OF RESTRAINT/SECLUSION    1. Current mental status/behavior: Denies suicidal ideation and Denies homicidal ideation    2. Criteria for release met: No longer verbalizing threats of harm to self and others, Acute signs and symptoms necessitating restraint/seclusion have decreased substantially to the level where patient can safely function in least restrictive environment., Substantial reduction in level of agitation/anxiety as indicated by reduction in motor over activity, ability to focus, maintian attention and decrease in hostility and Agreed to contact for safety    3. Additional interventions to prevent recurrence of dangerous behaviors include the following in addition to the debriefing process by the team.         RN Signature__________________________________ Date_______________      MD Signature__________________________________ Date_______________

## 2013-06-29 NOTE — Behavioral Health Treatment Team (Cosign Needed)
GROUP THERAPY PROGRESS NOTE    Antonio Potter is participating in Goals group.     Group time: 30 minutes    Personal goal for participation: to orient daily goal.    Goal orientation: personal    Group therapy participation: active    Therapeutic interventions reviewed and discussed: yes    Impression of participation: no problem

## 2013-06-29 NOTE — Behavioral Health Treatment Team (Cosign Needed)
GROUP THERAPY PROGRESS NOTE    [place]     Group time: 30 minutes    Personal goal for participation: daily orientation    Goal orientation: personal    Group therapy participation: active    Therapeutic interventions reviewed and discussed: yes    Impression of participation: cooperative

## 2013-06-29 NOTE — Behavioral Health Treatment Team (Signed)
Social Work Note:     Pt says he has some concerns and would like to address these concerns with a Patient Advocate. Pt says he will not be returning to [place] as he no longer lives there. He says he is missing his ID and needs to get that from the Dept. of Motor Vehicles [facility]([employer]) insisting our staff be able to assist with that. He is unsure where he will go when discharged. He has heard of [facility] but says he does not need help.     S. Rysinski, LCSW

## 2013-06-29 NOTE — Behavioral Health Treatment Team (Signed)
0155 = patient came out from his room and asked for pain medication, he was he was offered tylenol and he refused it and went back to his room  and started excising.     [id number] = Patient came out of his room psychotic acting up, using vulgar languages, saying things he would do to women of different races that where every inappropriate. He was redirected but he refused to follow redirection. He started rolling on the floor refusing to get off the floor and he was medicated with Geodon IM after which he  rolled on the floor to his room.     [id number] = patient was observed by staff member with his towel that he had ripped pieces and made it a rope in an attempt to hang himself. He was unable to contract for safety and therefore was seclusion as a one to one observation. And paper gown was offered but patient refused. Dr Latanya Maudlinahlvani was notified and he ordered the locked seclusion for safety.

## 2013-06-29 NOTE — Progress Notes (Signed)
Spiritual Care Assessment/Progress Notes    Antonio Potter [id number]  [id number]    Mar 20, 1961  53 y.o.  male    Patient Telephone Number: [phone] (home)   Religious Affiliation: No religion   Language: English   Extended Emergency Contact Information  Primary Emergency Contact: None,Given   [place]  Relation: None   Patient Active Problem List    Diagnosis Date Noted   ??? Schizophrenia 06/27/2013   ??? Psychosis 10/19/2012   ??? Altered thought processes 10/19/2012        Date: 06/29/2013       Level of Religious/Spiritual Activity:  [x]          Involved in faith tradition/spiritual practice    []          Not involved in faith tradition/spiritual practice  [x]          Spiritually oriented    []          Claims no spiritual orientation    []          seeking spiritual identity  []          Feels alienated from religious practice/tradition  []          Feels angry about religious practice/tradition  [x]          Spirituality/religious tradition is a Theatre stage managerresource for coping at this time.  []          Not able to assess due to medical condition    Services Provided Today:  []          crisis intervention    []          reading Scriptures  [x]          spiritual assessment    [x]          prayer  [x]          empathic listening/emotional support  []          rites and rituals (cite in comments)  [x]          life review     []          religious support  []          theological development   []          advocacy  []          ethical dialog     []          blessing  []          bereavement support    []          support to family  []          anticipatory grief support   []          help with AMD  []          spiritual guidance    []          meditation      Spiritual Care Needs  [x]          Emotional Support  [x]          Spiritual/Religious Care  []          Loss/Adjustment  []          Advocacy/Referral /Ethics  []          No needs expressed at this time  []          Other: (note in comments)  Spiritual Care Plan  []          Follow up  visits with pt/family  []   Provide materials  []          Schedule sacraments  []          Contact [facility]  [x]          Follow up as needed  []          Other: (note in comments)     Comments:   Paged to visit with 310(2). Pt requested for guidance and prayer for his plans. 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 Pt talked about his life and things he has accomplished in life. Pastoral presence, listening, words of encouragement, comfort, and prayer support provided.    Chaplain Sharen Hones, PRN Chaplain  To contact the chaplain please call [phone])

## 2013-06-29 NOTE — Behavioral Health Treatment Team (Signed)
Social Work Note:     Pt said [facility] not [facility] is discharged since he does not need any help.     S. Rysinski, LCSW

## 2013-06-29 NOTE — Progress Notes (Signed)
Problem: Altered Thought Process (Adult/Pediatric)  Goal: *STG: Participates in treatment plan  Outcome: Progressing Towards Goal  Patient has been visible on the unit during this shift watching television with euthymic affect and mood. Pt has been required redirection away from the nurse's station several times.  Pt denies any S/H thought and/or A/V hallucination. Pt has been medication and meal compliant. Pt was encouraged to attend all group activities and to discuss any issues or concerns with staff. Staff will also continue to monitor pt with Q -15 checks for safety.

## 2013-06-29 NOTE — Behavioral Health Treatment Team (Signed)
Social Work Psychosocial Assessment     Pt is a 53 year old male who was admitted to [facility] after causing a disturbance at a store on [address] after getting off a bus. There are conflicting reports that pt was either at the bus station and told someone he felt ill and another report that says he got off the bus on [address] and walked into a business talking non-sensical with pressured speech. Pt states he simply "got off a bus and the police came". It is unclear why he was on a bus. Pt had paperwork on him that reveals his address is [address], [place], [place] [id number]. Diagnosis includes Psychosis with Altered Thought Process.     Pt is guarded, bizarre, and has no insight into his admission. Pt denies any history of mental illness or current Behavioral Health issues. Pt has loud pressured speech.    Pt [place] in [place] and says he does not see a psychiatrist or therapist "since I don't know any help".     S. Rysinski, LCSW

## 2013-06-29 NOTE — Behavioral Health Treatment Team (Signed)
Patient rested in bed with eyes closed for the most part of the night. No complains or discomfort noted. Slept a total of 4 hours. Will continue to monitor.

## 2013-06-29 NOTE — Behavioral Health Treatment Team (Signed)
Psychiatric Progress Note        Patient Name  Antonio Potter   Date of Birth 25-Apr-1961   CSN [id number]   Medical Record Number  [id number]      Age  53 y.o.   PCP None   Admit date:  06/27/2013    Room Number  310/02  @ [facility]   Date of Service  06/29/2013            PSYCHOTHERAPY SESSION:  Length of psychotherapy session: 15 minutes  Main condition/diagnosis treated during session today: Manic,psychosis and self harm behavior.    Interpersonal relationship issues and psychodynamic conflicts explored.  Supportive psychotherapy provided in regards to various ongoing psychosocial stressors (including some of the following):   pre-admission and current problems   Housing issues   Occupational issues   Academic issues   Legal issues   Medical issues   Interpersonal conflicts   Stress of hospitalization              Worked on issues of denial & effects of substance dependency/use  Cognitive/Behavioral therapy provided  Reality-Oriented psychotherapy provided     Pt is not  progressing    Extended energy and skill set needed to engage pt in psychotherapy due to the following: resistiveness of patient, complexity, resistance/negativity of patient, confrontational/hostile behaviors, and/or severe abnormalities in thought processes/psychosis resulting in the loss of expressive/receptive language communication skills.                              E & M PROGRESS NOTE:         HISTORY       CC: (reviewed/updated 06/29/2013)   Pt admitted under a temporary detention order (TDO) ,severe psychosis and mania proving to be an imminent danger to self and others and an inability to care for self    HISTORY OF PRESENT ILLNESS/INTERVAL HISTORY:  (reviewed/updated 06/29/2013).  The patient Antonio Potter is a 53 y.o. male with a past psychiatric history of schizophrenia who presents at this time for an exacerbation of psychotic symptoms . Patient reports/evidences the following emotional symptoms: agitation, delusions,  paranoid behavior and psychosis. The symptoms have been present for few days and are of severity. The symptoms are constant in nature. Additional symptoms include agitation, anger outbursts and financial problems . Condition precipitated by and psychosocial stressors homeless Condition treatment noncompliance. BAL=0.  Patient is not a reliable historian. From his medical record prescreening it was reported that he was causing disturbance at the store on [address] after getting off bus.Some one informed the police and he was brought to the ER. 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  Today patient was seen during morning round with security present.last night patient was noted for inappropriate behavior toward staff.demanding,diplayed bizarre behavior rolling on the floor to his room.Patient was given Geodon PRN.Later staff observed with his towel that he ripped in pieces and made a rope in an attempt to hang himself.He was unable to contract for safety.RN called me last night to place him in seclusion for safety.  This morning patient demanding to  have a double portion of his meals. He refuse my offer to accept any psychotropic medications.He was loud,mnaic and uncooperative.He remains manic and psychotic.     SIDE EFFECTS: (reviewed/updated 06/29/2013)  None reported or admitted to.     ALLERGIES:(reviewed/updated 06/29/2013)  No Known Allergies   MEDICATIONS PRIOR TO ADMISSION:(reviewed/updated 06/29/2013)  Prescriptions prior to admission   Medication Sig   ??? amLODIPine (NORVASC) 5 mg tablet Take 1 Tab by mouth daily.   ??? albuterol (PROVENTIL HFA, VENTOLIN HFA) 90 mcg/actuation inhaler Take  2 Puffs by inhalation every four (4) hours as needed for Wheezing or Shortness of Breath.   ??? fluPHENAZine (PROLIXIN) 10 mg tablet Take 1 Tab by mouth two (2) times a day.      PAST MEDICAL HISTORY: Past medical history from the initial psychiatric evaluation has been reviewed (reviewed/updated 06/29/2013) with no additional updates (I asked patient and no additional past medical history provided). Active Ambulatory Problems     Diagnosis Date Noted   ??? Psychosis 10/19/2012   ??? Altered thought processes 10/19/2012     Resolved Ambulatory Problems     Diagnosis Date Noted   ??? No Resolved Ambulatory Problems     No Additional Past Medical History      SOCIAL HISTORY: Social history from the initial psychiatric evaluation has been reviewed (reviewed/updated 06/29/2013) with no additional updates (I asked patient and no additional social history provided). History     Social History Narrative     History   Substance Use Topics   ??? Smoking status: Unknown If Ever Smoked   ??? Smokeless tobacco: Not on file   ??? Alcohol Use: Not on file      FAMILY HISTORY: Family history from the initial psychiatric evaluation has been reviewed (reviewed/updated 06/29/2013) with no additional updates (I asked patient and no additional family history provided). No family history on file.    REVIEW of SYSTEMS: (reviewed/updated 06/29/2013)  Appetite:good   Sleep: good   All other Review of Systems: - Psychological ROS: positive for - behavioral disorder, hostility, irritability and mood swings  Respiratory ROS: no cough, shortness of breath, or wheezing  Cardiovascular ROS: no chest pain or dyspnea on exertion         MENTAL STATUS EXAM & VITALS     MENTAL STATUS EXAM:    FINDINGS WITHIN NORMAL LIMITS (WNL) UNLESS OTHERWISE STATED BELOW:    Orientation Alert and Oriented x 2   Vital Signs (BP,Pulse, Temp) See below (reviewed 06/29/2013)   Gait and Station Within normal limits   Abnormal Muscular Movements/Tone/Behavior No EPS, no Tardive Dyskinesia, no  abnormal muscular movements; wnl tone   Relations uncooperative and unreliable   General Appearance:  age appropriate, overweight and well dressed   Language No aphasia or dysarthria   Speech:  hyperverbal   Thought Processes illogical, wnl rate of thoughts, good abstract reasoning and computation   Thought Associations illogical, disorganized and tangential   Thought Content delusions and internally preoccupied    Suicidal Ideations no plan  and no intention   Homicidal Ideations no plan  and no intention   Mood:  hostile, irritable and labile   Affect:  hostile, irritable and labile   Memory recent  adequate   Memory remote:  adequate   Concentration/Attention:  adequate   Fund of Knowledge Fair/average   Insight:  poor   Reliability fair   Judgment:  poor        VITALS:     Patient  Vitals for the past 24 hrs:   Pulse BP   06/29/13 0843 89 157/96 mmHg            DATA     LABORATORY DATA:(reviewed/updated 06/29/2013)  Labs Reviewed - No data to display  No visits with results within 2 Day(s) from this visit.  Latest known visit with results is:    Admission on 06/27/2013, Discharged on 06/27/2013   Component Date Value Ref Range Status   ??? Color 06/27/2013 YELLOW/STRAW   Final   ??? Appearance 06/27/2013 CLEAR  CLEAR   Final   ??? Specific gravity 06/27/2013 1.018  1.003 - 1.030   Final   ??? pH (UA) 06/27/2013 5.0  5.0 - 8.0   Final   ??? Protein 06/27/2013 NEGATIVE   NEG mg/dL Final   ??? Glucose 06/27/2013 NEGATIVE   NEG mg/dL Final   ??? Ketone 06/27/2013 NEGATIVE   NEG mg/dL Final   ??? Bilirubin 06/27/2013 NEGATIVE   NEG   Final   ??? Blood 06/27/2013 NEGATIVE   NEG   Final   ??? Urobilinogen 06/27/2013 0.2  0.2 - 1.0 EU/dL Final   ??? Nitrites 06/27/2013 NEGATIVE   NEG   Final   ??? Leukocyte Esterase 06/27/2013 NEGATIVE   NEG   Final   ??? UA:UC IF INDICATED 06/27/2013 CULTURE NOT INDICATED BY UA RESULT  CNI   Final   ??? WBC 06/27/2013 0-4  0 - 4 /hpf Final   ??? RBC 06/27/2013 0-5  0 - 5 /hpf Final   ??? Epithelial cells 06/27/2013 FEW   FEW /lpf Final   ??? Bacteria 06/27/2013 NEGATIVE   NEG /hpf Final   ??? Hyaline Cast 06/27/2013 0-2  0 - 2 Final   ??? Ventricular Rate 06/27/2013 67   Final   ??? Atrial Rate 06/27/2013 67   Final   ??? P-R Interval 06/27/2013 148   Final   ??? QRS Duration 06/27/2013 90   Final   ??? Q-T Interval 06/27/2013 390   Final   ??? QTC Calculation (Bezet) 06/27/2013 412   Final   ??? Calculated P Axis 06/27/2013 51   Final   ??? Calculated R Axis 06/27/2013 -17   Final   ??? Calculated T Axis 06/27/2013 42   Final   ??? Diagnosis 06/27/2013    Final                    Value:Normal sinus rhythm  Leftward axis  Nonspecific ST and T wave abnormality    No previous ECGs available  Confirmed by Elba Barman, M.D., [place] [phone]) on 06/27/2013 3:52:03 PM     ??? AMPHETAMINE 06/27/2013 NEGATIVE   NEG   Final   ??? BARBITURATES 06/27/2013 NEGATIVE   NEG   Final   ??? BENZODIAZEPINE 06/27/2013 NEGATIVE   NEG   Final   ??? COCAINE 06/27/2013 NEGATIVE   NEG   Final   ??? METHADONE 06/27/2013 NEGATIVE   NEG   Final   ??? OPIATES 06/27/2013 NEGATIVE   NEG   Final   ??? PCP(PHENCYCLIDINE) 06/27/2013 NEGATIVE   NEG   Final   ??? THC (TH-CANNABINOL) 06/27/2013 NEGATIVE   NEG   Final   ??? DRUG SCRN COMMENT 06/27/2013 (NOTE)   Final   ??? SALICYLATE 25/95/6387 <5.6* 2.8 - 20.0 MG/DL Final   ??? ACETAMINOPHEN 06/27/2013 <2* 10 - 30 ug/mL Final   ??? ALCOHOL(ETHYL),SERUM 06/27/2013 <10  <10 MG/DL Final   ??? WBC 06/27/2013 8.7  4.1 - 11.1 K/uL Final   ??? RBC 06/27/2013 3.98* 4.10 - 5.70 M/uL Final   ??? HGB 06/27/2013 11.5* 12.1 - 17.0 g/dL Final   ??? HCT 06/27/2013 36.7  36.6 - 50.3 % Final   ??? MCV 06/27/2013 92.2  80.0 - 99.0 FL Final   ??? MCH 06/27/2013 28.9  26.0 - 34.0 PG Final   ??? MCHC 06/27/2013 31.3  30.0 - 36.5 g/dL Final   ??? RDW 06/27/2013 15.7* 11.5 - 14.5 % Final   ??? PLATELET 06/27/2013 259  150 - 400 K/uL Final   ??? NEUTROPHILS 06/27/2013 69  32 - 75 % Final   ??? LYMPHOCYTES 06/27/2013 21  12 - 49 % Final   ??? MONOCYTES 06/27/2013 8  5 - 13 % Final   ??? EOSINOPHILS 06/27/2013 2  0 - 7 % Final    ??? BASOPHILS 06/27/2013 0  0 - 1 % Final   ??? ABS. NEUTROPHILS 06/27/2013 6.0  1.8 - 8.0 K/UL Final   ??? ABS. LYMPHOCYTES 06/27/2013 1.8  0.8 - 3.5 K/UL Final   ??? ABS. MONOCYTES 06/27/2013 0.7  0.0 - 1.0 K/UL Final   ??? ABS. EOSINOPHILS 06/27/2013 0.2  0.0 - 0.4 K/UL Final   ??? ABS. BASOPHILS 06/27/2013 0.0  0.0 - 0.1 K/UL Final   ??? Sodium 06/27/2013 139  136 - 145 mmol/L Final   ??? Potassium 06/27/2013 4.2  3.5 - 5.1 mmol/L Final   ??? Chloride 06/27/2013 105  97 - 108 mmol/L Final   ??? CO2 06/27/2013 32  21 - 32 mmol/L Final   ??? Anion gap 06/27/2013 2* 5 - 15 mmol/L Final   ??? Glucose 06/27/2013 88  65 - 100 mg/dL Final   ??? BUN 06/27/2013 17  6 - 20 MG/DL Final   ??? Creatinine 06/27/2013 0.67  0.45 - 1.15 MG/DL Final   ??? BUN/Creatinine ratio 06/27/2013 25* 12 - 20   Final   ??? GFR est AA 06/27/2013 >60  >60 ml/min/1.64m Final   ??? GFR est non-AA 06/27/2013 >60  >60 ml/min/1.714mFinal   ??? Calcium 06/27/2013 8.2* 8.5 - 10.1 MG/DL Final   ??? Bilirubin, total 06/27/2013 0.3  0.2 - 1.0 MG/DL Final   ??? ALT 06/27/2013 21  12 - 78 U/L Final   ??? AST 06/27/2013 25  15 - 37 U/L Final   ??? Alk. phosphatase 06/27/2013 80  45 - 117 U/L Final   ??? Protein, total 06/27/2013 6.5  6.4 - 8.2 g/dL Final   ??? Albumin 06/27/2013 2.9* 3.5 - 5.0 g/dL Final   ??? Globulin 06/27/2013 3.6  2.0 - 4.0 g/dL Final   ??? A-G Ratio 06/27/2013 0.8* 1.1 - 2.2   Final      RADIOLOGY REPORTS:    Results from Hospital Encounter encounter on 06/27/13   XR CHEST PORT   Narrative **Final Report**      ICD Codes / Adm.Diagnosis: 16818299 / Mental Health Problem    Examination:  CR CHEST PORT  - [id number] Jun 27 2013  2:03PM  Accession No:  [id number]:  Chest Pain      REPORT:  EXAM:  CR CHEST PORT    INDICATION:  Chest Pain    COMPARISON:  None.    FINDINGS: A portable AP radiograph of the chest was obtained at 1400 hours.   The patient is on a cardiac monitor. The heart size is within normal limits.   The lung volumes are  slightly low and there is a linear area of  opacity in   right mid zone likely scarring or discoid atelectasis. No pneumonia or   pulmonary edema. The examination is somewhat limited due to the portable   technique and the patient's body habitus.    The trachea just above the thoracic inlet appears to be narrowed   significantly bilaterally over a 1.5 cm segment. The transverse diameter of   the trachea measured possibly 5 mm in the area of narrowing. Clinical   correlation is recommended has the patient had previous intubated or had a   tracheostomy.       IMPRESSION:  1. Apparent narrowing of the trachea just above the thoracic inlet. Clinical   correlation is suggested           Signing/Reading Doctor: Marlou Sa [phone])    Approved: Marlou Sa [phone])  Jun 27 2013  2:08PM                                 Ct Head Wo Cont    06/27/2013   **Final Report**    ICD Codes / Adm.Diagnosis: 703500   / Mental Health Problem   Examination:  CT HEAD WO CON  - [id number] - Jun 27 2013  2:12PM Accession No:  [id number] Reason:  Pain   REPORT: INDICATION: Pain   Exam: Noncontrast CT of the brain is performed with 5 mm collimation.  FINDINGS: There is no acute intracranial hemorrhage, mass, mass effect or  herniation. Ventricular system is normal. The gray-white matter  differentiation is well-preserved. The mastoid air cells are well  pneumatized. There is mild mucosal thickening within the visualized right  maxillary sinus. The visualized paranasal sinuses are otherwise normal.     IMPRESSION: No acute intracranial hemorrhage, mass or infarct.          Signing/Reading Doctor: TODD B. BAIRD [phone])   Approved: TODD B. Randon Goldsmith [phone])  Jun 27 2013  2:42PM                                Xr Chest [facility]    06/27/2013   **Final Report**    ICD Codes / Adm.Diagnosis: [id number]   / Mental Health Problem   Examination:  CR CHEST PORT  - [id number] - Jun 27 2013  2:03PM Accession No:  [id number] Reason:  Chest Pain   REPORT: EXAM:  CR CHEST PORT  INDICATION:  Chest Pain  COMPARISON:   None.  FINDINGS: A portable AP radiograph of the chest was obtained at 1400 hours.  The patient is on a cardiac monitor. The heart size is within normal limits.  The lung volumes are slightly low and there is a linear area of opacity in  right mid zone likely scarring or discoid atelectasis. No pneumonia or  pulmonary edema. The examination is somewhat limited due to the portable  technique and the patient's body habitus.  The trachea just above the thoracic inlet appears to be narrowed  significantly bilaterally over a 1.5 cm segment. The transverse diameter of  the trachea measured possibly 5 mm in the area of narrowing. Clinical  correlation is recommended has the patient had previous intubated or had a  tracheostomy.     IMPRESSION: 1. Apparent narrowing of the trachea just above the thoracic inlet. Clinical  correlation is suggested  Signing/Reading Doctor: Marlou Sa [phone])   Approved: Marlou Sa ([phone])  Jun 27 2013  2:08PM                                      MEDICATIONS     ALL MEDICATIONS:   Current Facility-Administered Medications   Medication Dose Route Frequency   ??? amLODIPine (NORVASC) tablet 5 mg  5 mg Oral DAILY   ??? albuterol (PROVENTIL HFA, VENTOLIN HFA, PROAIR HFA) inhaler 2 Puff  2 Puff Inhalation Q4H PRN   ??? therapeutic multivitamin (THERAGRAN) tablet 1 Tab  1 Tab Oral DAILY   ??? ziprasidone (GEODON) 20 mg in sterile water (preservative free) injection  20 mg IntraMUSCular BID PRN   ??? OLANZapine (ZyPREXA) tablet 5 mg  5 mg Oral Q6H PRN   ??? benztropine (COGENTIN) tablet 2 mg  2 mg Oral BID PRN   ??? benztropine (COGENTIN) injection 2 mg  2 mg IntraMUSCular Q12H PRN   ??? LORazepam (ATIVAN) injection 2 mg  2 mg IntraMUSCular Q4H PRN   ??? LORazepam (ATIVAN) tablet 1 mg  1 mg Oral Q4H PRN   ??? zolpidem (AMBIEN) tablet 10 mg  10 mg Oral QHS PRN   ??? acetaminophen (TYLENOL) tablet 650 mg  650 mg Oral Q4H PRN   ??? ibuprofen (MOTRIN) tablet 400 mg  400 mg Oral Q8H PRN   ??? magnesium hydroxide  (MILK OF MAGNESIA) oral suspension 30 mL  30 mL Oral DAILY PRN   ??? nicotine (NICODERM CQ) 21 mg/24 hr patch 1 Patch  1 Patch TransDERmal DAILY PRN      SCHEDULED MEDICATIONS:   Current Facility-Administered Medications   Medication Dose Route Frequency   ??? amLODIPine (NORVASC) tablet 5 mg  5 mg Oral DAILY   ??? therapeutic multivitamin (THERAGRAN) tablet 1 Tab  1 Tab Oral DAILY            ASSESSMENT & PLAN     The patient, Antonio Potter, is a 53 y.o.  male who presents at this time for treatment of the following diagnoses: (reviewed/updated 06/29/2013)  Patient [facility] Problem List:   Schizophrenia (06/27/2013)    Assessment:Assessment: On my evaluation patient is displaying manic and psychotic symptoms. Very demanding and intrusive. Denies having mental illness. His thoughts process remains disorganized,tangetialand unreliable.He can be hostile.  Plan: Patient refusing any psychotropic medications recommdations."I will not take any psych.meds"he will need court order forced medications.Use PRN for severe psychosis and unsafe behavior.Monitor for violence.     Plan: continue monitor for safety.Consider  Court Forced medication order.               A coordinated, multidisplinary treatment team round was conducted with the patient; that includes the nurse, unit pharmcist, Catering manager all present.     The following regarding medications was addressed during rounds with patient:   the risks and benefits of the proposed medication. The patient was given the opportunity to ask questions. Informed consent given to the use of the above medications. Will continue to adjust psychiatric and non-psychiatric medications (see above "medication" section and orders section for details) as deemed appropriate & based upon diagnoses and response to treatment.     Will continue to order blood tests/labs and diagnostic tests as deemed appropriate and review results as they become available (see orders for details and  above listed lab/test results).  Will order psychiatric records from previous psych hospitals to further elucidate the nature of patient's psychopathology and review once available.    Will gather additional collateral information from  friends, family and o/p treatment team to further elucidate the nature of patient's psychopathology and baselline level of psychiatric functioning.    Complete current electronic health record for patient has been reviewed today including consultant notes, ancillary staff notes, nurses and psychiatric tech notes    Will continue to provide individual, milieu, occupational, group, and substance abuse therapies to address target symptoms as deemed appropriate for the individual patient.      EXPECTED DISCHARGE DATE/DAY: TBD     DISPOSITION: Shelter or ALF       Signed By:   Francesca Jewett, MD  06/29/2013

## 2013-06-29 NOTE — Behavioral Health Treatment Team (Cosign Needed)
REFLECTIONS GROUP THERAPY PROGRESS NOTE    The patient Antonio Potter is participating in Reflections Group Therapy.     Group time: 30 minutes    Personal goal for participation: Share with group about feelings and concerns throughout the course of the day.    Goal orientation: personal    Group therapy participation: active    Therapeutic interventions reviewed and discussed: Yes    Impression of participation:   Positive input.    Paulette Lanna PocheJ Allen  06/29/2013

## 2013-06-29 NOTE — Behavioral Health Treatment Team (Cosign Needed)
Pt observed refusing to follow directions, demanding of staff, intrusive and inappropriate with male staff.  Pt became verbally challenging and intimidating toward staff members, pt continue not following redirections, in and out of his room at rest period, spiliting  staff.  Pt demanding, wanting extra food, writing paper, wanting to speak with nurse manager, demanding to know who his social worker name, pt needs met and he continue to demand staff.  Pt focus on select male peer and flirting and harassing her, pt redirected to return to his room, he began to call staff names.  Pt remain on Q 15 min check for safety, will monitor and offer support and feedback when needed.

## 2013-06-30 MED ADMIN — OLANZapine (ZyPREXA) tablet 5 mg: ORAL | @ 03:00:00 | NDC 68084072311

## 2013-06-30 MED ADMIN — therapeutic multivitamin (THERAGRAN) tablet 1 Tab: ORAL | @ 14:00:00

## 2013-06-30 MED ADMIN — ibuprofen (MOTRIN) tablet 400 mg: ORAL | @ 23:00:00 | NDC 63739067210

## 2013-06-30 MED ADMIN — amLODIPine (NORVASC) tablet 5 mg: ORAL | @ 14:00:00 | NDC 50268008411

## 2013-06-30 MED ADMIN — zolpidem (AMBIEN) tablet 10 mg: ORAL | @ 03:00:00 | NDC 00904608261

## 2013-06-30 MED ADMIN — acetaminophen (TYLENOL) tablet 650 mg: ORAL | @ 03:00:00 | NDC 63739044001

## 2013-06-30 MED FILL — ZYPREXA 5 MG TABLET: 5 mg | ORAL | Qty: 1

## 2013-06-30 MED FILL — THERA 400 MCG TABLET: 400 mcg | ORAL | Qty: 1

## 2013-06-30 MED FILL — ZOLPIDEM 5 MG TAB: 5 mg | ORAL | Qty: 2

## 2013-06-30 MED FILL — AMLODIPINE 5 MG TAB: 5 mg | ORAL | Qty: 1

## 2013-06-30 MED FILL — IBUPROFEN 400 MG TAB: 400 mg | ORAL | Qty: 1

## 2013-06-30 MED FILL — MAPAP (ACETAMINOPHEN) 325 MG TABLET: 325 mg | ORAL | Qty: 2

## 2013-06-30 NOTE — Behavioral Health Treatment Team (Signed)
Art Therapy Group    Antonio GitelmanAnthony Potter attended Art Therapy Group    Group Time: 45 minutes    Treatment Goal for Each Participant: Use creative means to describe yourself currently.    Participation    moderate    Engagement with Peers   moderate     Impression of Participation: Pressured speech, intrusive, but easily redirected. Reported he "only needed needed blood pressure medication"; denied any mental health difficulties.    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 Drew a system of lines, and circles, stating "it is a ChiropractorChristian diagram".    Stated that "[name] are good role models, sometimes I go by the name [name]".

## 2013-06-30 NOTE — Behavioral Health Treatment Team (Signed)
Pt resting quietly in bed with eyes closed, respiration even and unlabored, no sign of any distress at this time. Pt will continue to be monitored per hospital protocol for safety and needs.

## 2013-06-30 NOTE — Behavioral Health Treatment Team (Signed)
Psychiatric Progress Note        Patient Name  Antonio Potter   Date of Birth 08-Aug-1960   CSN [id number]   Medical Record Number  [id number]      Age  53 y.o.   PCP None   Admit date:  06/27/2013    Room Number  310/02  @ [facility]   Date of Service  06/30/2013            PSYCHOTHERAPY SESSION:  Length of psychotherapy session: 15 minutes  Main condition/diagnosis treated during session today: Manic,psychosis and self harm behavior.    Interpersonal relationship issues and psychodynamic conflicts explored.  Supportive psychotherapy provided in regards to various ongoing psychosocial stressors (including some of the following):   pre-admission and current problems   Housing issues   Occupational issues   Academic issues   Legal issues   Medical issues   Interpersonal conflicts   Stress of hospitalization              Worked on issues of denial & effects of substance dependency/use  Cognitive/Behavioral therapy provided  Reality-Oriented psychotherapy provided     Pt is not  progressing    Extended energy and skill set needed to engage pt in psychotherapy due to the following: resistiveness of patient, complexity, resistance/negativity of patient, confrontational/hostile behaviors, and/or severe abnormalities in thought processes/psychosis resulting in the loss of expressive/receptive language communication skills.                              E & M PROGRESS NOTE:         HISTORY       CC: (reviewed/updated 06/30/2013)   Pt admitted under a temporary detention order (TDO) ,severe psychosis and mania proving to be an imminent danger to self and others and an inability to care for self    HISTORY OF PRESENT ILLNESS/INTERVAL HISTORY:  (reviewed/updated 06/30/2013).  The patient Antonio Potter is a 53 y.o. male with a past psychiatric history of schizophrenia who presents at this time for an exacerbation of psychotic symptoms . Patient reports/evidences the following emotional symptoms: agitation, delusions,  paranoid behavior and psychosis. The symptoms have been present for few days and are of severity. The symptoms are constant in nature. Additional symptoms include agitation, anger outbursts and financial problems . Condition precipitated by and psychosocial stressors homeless Condition treatment noncompliance. BAL=0.  Patient is not a reliable historian. From his medical record prescreening it was reported that he was causing disturbance at the store on [address] after getting off bus.Some one informed the police and he was brought to the ER. 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  Patient is reported by the staff to be rude, demanding and sexually inappropriate with the staff. Demanding more food and place to live but denies having any mental illness and will not take any medications. Over past 48 hours received multiple PRN of Zyprexa, Geodon, Ambien and on narcotic pain medications. He is morbidly obese and has poor hygiene. BP is high. No gross psychosis or mania. Denies any SI or HI.  SIDE EFFECTS: (reviewed/updated 06/30/2013)  None reported or admitted to.     ALLERGIES:(reviewed/updated 06/30/2013)  No Known Allergies   MEDICATIONS PRIOR TO ADMISSION:(reviewed/updated 06/30/2013)  Prescriptions prior to admission   Medication Sig   ??? amLODIPine (NORVASC) 5 mg tablet Take 1 Tab by mouth daily. (Patient taking differently: Take 5 mg by mouth daily. Indications: HYPERTENSION)   ??? albuterol (PROVENTIL HFA, VENTOLIN HFA) 90 mcg/actuation inhaler Take 2 Puffs by inhalation every four (4) hours as needed for Wheezing or Shortness of Breath.   ??? fluPHENAZine (PROLIXIN) 10 mg tablet Take  1 Tab by mouth two (2) times a day. (Patient taking differently: Take 10 mg by mouth two (2) times a day. Indications: PSYCHOTIC DISORDER)      PAST MEDICAL HISTORY: Past medical history from the initial psychiatric evaluation has been reviewed (reviewed/updated 06/30/2013) with no additional updates (I asked patient and no additional past medical history provided).   Active Ambulatory Problems     Diagnosis Date Noted   ??? Psychosis 10/19/2012     Resolved Ambulatory Problems     Diagnosis Date Noted   ??? Altered thought processes 10/19/2012     No Additional Past Medical History      SOCIAL HISTORY: Social history from the initial psychiatric evaluation has been reviewed (reviewed/updated 06/30/2013) with no additional updates (I asked patient and no additional social history provided).   History     Social History Narrative     History   Substance Use Topics   ??? Smoking status: Unknown If Ever Smoked   ??? Smokeless tobacco: Not on file   ??? Alcohol Use: Not on file      FAMILY HISTORY: Family history from the initial psychiatric evaluation has been reviewed (reviewed/updated 06/30/2013) with no additional updates (I asked patient and no additional family history provided). No family history on file.    REVIEW of SYSTEMS: (reviewed/updated 06/30/2013)  Appetite:good   Sleep: good   All other Review of Systems: - Psychological ROS: positive for - behavioral disorder, hostility, irritability and mood swings  Respiratory ROS: no cough, shortness of breath, or wheezing  Cardiovascular ROS: no chest pain or dyspnea on exertion         MENTAL STATUS EXAM & VITALS     MENTAL STATUS EXAM:    FINDINGS WITHIN NORMAL LIMITS (WNL) UNLESS OTHERWISE STATED BELOW:    Orientation Alert and Oriented x 2   Vital Signs (BP,Pulse, Temp) See below (reviewed 06/30/2013)   Gait and Station Within normal limits   Abnormal Muscular Movements/Tone/Behavior No EPS, no Tardive Dyskinesia, no abnormal muscular movements; wnl tone   Relations uncooperative  and unreliable   General Appearance:  age appropriate, overweight and well dressed   Language No aphasia or dysarthria   Speech:  hyperverbal   Thought Processes llogical, wnl rate of thoughts, poor abstract reasoning and computation   Thought Associations Logical and linear   Thought Content No delusions or hallucinations   Suicidal Ideations no plan  and no intention   Homicidal Ideations no plan  and no intention   Mood:  hostile, irritable and labile   Affect:  hostile, irritable and labile   Memory recent  adequate   Memory remote:  adequate   Concentration/Attention:  adequate   Fund of Knowledge Fair/average   Insight:  poor   Reliability fair   Judgment:  poor        VITALS:     Patient Vitals for the past 24 hrs:  Temp Pulse Resp BP   06/30/13 1240 - 74 - 162/93 mmHg   06/30/13 0612 97.4 ??F (36.3 ??C) 82 20 153/108 mmHg            DATA     LABORATORY DATA:(reviewed/updated 06/30/2013)  Labs Reviewed - No data to display  No visits with results within 2 Day(s) from this visit.  Latest known visit with results is:    Admission on 06/27/2013, Discharged on 06/27/2013   Component Date Value Ref Range Status   ??? Color 06/27/2013 YELLOW/STRAW   Final   ??? Appearance 06/27/2013 CLEAR  CLEAR   Final   ??? Specific gravity 06/27/2013 1.018  1.003 - 1.030   Final   ??? pH (UA) 06/27/2013 5.0  5.0 - 8.0   Final   ??? Protein 06/27/2013 NEGATIVE   NEG mg/dL Final   ??? Glucose 06/27/2013 NEGATIVE   NEG mg/dL Final   ??? Ketone 06/27/2013 NEGATIVE   NEG mg/dL Final   ??? Bilirubin 06/27/2013 NEGATIVE   NEG   Final   ??? Blood 06/27/2013 NEGATIVE   NEG   Final   ??? Urobilinogen 06/27/2013 0.2  0.2 - 1.0 EU/dL Final   ??? Nitrites 06/27/2013 NEGATIVE   NEG   Final   ??? Leukocyte Esterase 06/27/2013 NEGATIVE   NEG   Final   ??? UA:UC IF INDICATED 06/27/2013 CULTURE NOT INDICATED BY UA RESULT  CNI   Final   ??? WBC 06/27/2013 0-4  0 - 4 /hpf Final   ??? RBC 06/27/2013 0-5  0 - 5 /hpf Final   ??? Epithelial cells 06/27/2013 FEW  FEW /lpf Final   ???  Bacteria 06/27/2013 NEGATIVE   NEG /hpf Final   ??? Hyaline Cast 06/27/2013 0-2  0 - 2 Final   ??? Ventricular Rate 06/27/2013 67   Final   ??? Atrial Rate 06/27/2013 67   Final   ??? P-R Interval 06/27/2013 148   Final   ??? QRS Duration 06/27/2013 90   Final   ??? Q-T Interval 06/27/2013 390   Final   ??? QTC Calculation (Bezet) 06/27/2013 412   Final   ??? Calculated P Axis 06/27/2013 51   Final   ??? Calculated R Axis 06/27/2013 -17   Final   ??? Calculated T Axis 06/27/2013 42   Final   ??? Diagnosis 06/27/2013    Final                    Value:Normal sinus rhythm  Leftward axis  Nonspecific ST and T wave abnormality    No previous ECGs available  Confirmed by Elba Barman, M.D., [place] [phone]) on 06/27/2013 3:52:03 PM     ??? AMPHETAMINE 06/27/2013 NEGATIVE   NEG   Final   ??? BARBITURATES 06/27/2013 NEGATIVE   NEG   Final   ??? BENZODIAZEPINE 06/27/2013 NEGATIVE   NEG   Final   ??? COCAINE 06/27/2013 NEGATIVE   NEG   Final   ??? METHADONE 06/27/2013 NEGATIVE   NEG   Final   ??? OPIATES 06/27/2013 NEGATIVE   NEG   Final   ??? PCP(PHENCYCLIDINE) 06/27/2013 NEGATIVE   NEG   Final   ??? THC (TH-CANNABINOL) 06/27/2013 NEGATIVE   NEG   Final   ??? DRUG SCRN COMMENT 06/27/2013 (NOTE)   Final   ??? SALICYLATE 95/12/3265 <1.2* 2.8 - 20.0 MG/DL Final   ??? ACETAMINOPHEN 06/27/2013 <2* 10 - 30 ug/mL Final   ??? ALCOHOL(ETHYL),SERUM 06/27/2013 <10  <10 MG/DL  Final   ??? WBC 06/27/2013 8.7  4.1 - 11.1 K/uL Final   ??? RBC 06/27/2013 3.98* 4.10 - 5.70 M/uL Final   ??? HGB 06/27/2013 11.5* 12.1 - 17.0 g/dL Final   ??? HCT 06/27/2013 36.7  36.6 - 50.3 % Final   ??? MCV 06/27/2013 92.2  80.0 - 99.0 FL Final   ??? MCH 06/27/2013 28.9  26.0 - 34.0 PG Final   ??? MCHC 06/27/2013 31.3  30.0 - 36.5 g/dL Final   ??? RDW 06/27/2013 15.7* 11.5 - 14.5 % Final   ??? PLATELET 06/27/2013 259  150 - 400 K/uL Final   ??? NEUTROPHILS 06/27/2013 69  32 - 75 % Final   ??? LYMPHOCYTES 06/27/2013 21  12 - 49 % Final   ??? MONOCYTES 06/27/2013 8  5 - 13 % Final   ??? EOSINOPHILS 06/27/2013 2  0 - 7 % Final   ??? BASOPHILS  06/27/2013 0  0 - 1 % Final   ??? ABS. NEUTROPHILS 06/27/2013 6.0  1.8 - 8.0 K/UL Final   ??? ABS. LYMPHOCYTES 06/27/2013 1.8  0.8 - 3.5 K/UL Final   ??? ABS. MONOCYTES 06/27/2013 0.7  0.0 - 1.0 K/UL Final   ??? ABS. EOSINOPHILS 06/27/2013 0.2  0.0 - 0.4 K/UL Final   ??? ABS. BASOPHILS 06/27/2013 0.0  0.0 - 0.1 K/UL Final   ??? Sodium 06/27/2013 139  136 - 145 mmol/L Final   ??? Potassium 06/27/2013 4.2  3.5 - 5.1 mmol/L Final   ??? Chloride 06/27/2013 105  97 - 108 mmol/L Final   ??? CO2 06/27/2013 32  21 - 32 mmol/L Final   ??? Anion gap 06/27/2013 2* 5 - 15 mmol/L Final   ??? Glucose 06/27/2013 88  65 - 100 mg/dL Final   ??? BUN 06/27/2013 17  6 - 20 MG/DL Final   ??? Creatinine 06/27/2013 0.67  0.45 - 1.15 MG/DL Final   ??? BUN/Creatinine ratio 06/27/2013 25* 12 - 20   Final   ??? GFR est AA 06/27/2013 >60  >60 ml/min/1.9m Final   ??? GFR est non-AA 06/27/2013 >60  >60 ml/min/1.712mFinal   ??? Calcium 06/27/2013 8.2* 8.5 - 10.1 MG/DL Final   ??? Bilirubin, total 06/27/2013 0.3  0.2 - 1.0 MG/DL Final   ??? ALT 06/27/2013 21  12 - 78 U/L Final   ??? AST 06/27/2013 25  15 - 37 U/L Final   ??? Alk. phosphatase 06/27/2013 80  45 - 117 U/L Final   ??? Protein, total 06/27/2013 6.5  6.4 - 8.2 g/dL Final   ??? Albumin 06/27/2013 2.9* 3.5 - 5.0 g/dL Final   ??? Globulin 06/27/2013 3.6  2.0 - 4.0 g/dL Final   ??? A-G Ratio 06/27/2013 0.8* 1.1 - 2.2   Final      RADIOLOGY REPORTS:    Results from Hospital Encounter encounter on 06/27/13   XR CHEST PORT   Narrative **Final Report**      ICD Codes / Adm.Diagnosis: 16062694 / Mental Health Problem    Examination:  CR CHEST PORT  - [id number] Jun 27 2013  2:03PM  Accession No:  [id number]:  Chest Pain      REPORT:  EXAM:  CR CHEST PORT    INDICATION:  Chest Pain    COMPARISON:  None.    FINDINGS: A portable AP radiograph of the chest was obtained at 1400 hours.   The patient is on a cardiac monitor. The heart size is within  normal limits.   The lung volumes are slightly low and there is a linear area of opacity in    right mid zone likely scarring or discoid atelectasis. No pneumonia or   pulmonary edema. The examination is somewhat limited due to the portable   technique and the patient's body habitus.    The trachea just above the thoracic inlet appears to be narrowed   significantly bilaterally over a 1.5 cm segment. The transverse diameter of   the trachea measured possibly 5 mm in the area of narrowing. Clinical   correlation is recommended has the patient had previous intubated or had a   tracheostomy.       IMPRESSION:  1. Apparent narrowing of the trachea just above the thoracic inlet. Clinical   correlation is suggested           Signing/Reading Doctor: Marlou Sa [phone])    Approved: Marlou Sa [phone])  Jun 27 2013  2:08PM                                 Ct Head Wo Cont    06/27/2013   **Final Report**    ICD Codes / Adm.Diagnosis: 626948   / Mental Health Problem   Examination:  CT HEAD WO CON  - [id number] - Jun 27 2013  2:12PM Accession No:  [id number] Reason:  Pain   REPORT: INDICATION: Pain   Exam: Noncontrast CT of the brain is performed with 5 mm collimation.  FINDINGS: There is no acute intracranial hemorrhage, mass, mass effect or  herniation. Ventricular system is normal. The gray-white matter  differentiation is well-preserved. The mastoid air cells are well  pneumatized. There is mild mucosal thickening within the visualized right  maxillary sinus. The visualized paranasal sinuses are otherwise normal.     IMPRESSION: No acute intracranial hemorrhage, mass or infarct.          Signing/Reading Doctor: TODD B. BAIRD [phone])   Approved: TODD B. Randon Goldsmith [phone])  Jun 27 2013  2:42PM                                Xr Chest [facility]    06/27/2013   **Final Report**    ICD Codes / Adm.Diagnosis: 381017   / Mental Health Problem   Examination:  CR CHEST PORT  - [id number] - Jun 27 2013  2:03PM Accession No:  [id number] Reason:  Chest Pain   REPORT: EXAM:  CR CHEST PORT  INDICATION:  Chest Pain  COMPARISON:  None.   FINDINGS: A portable AP radiograph of the chest was obtained at 1400 hours.  The patient is on a cardiac monitor. The heart size is within normal limits.  The lung volumes are slightly low and there is a linear area of opacity in  right mid zone likely scarring or discoid atelectasis. No pneumonia or  pulmonary edema. The examination is somewhat limited due to the portable  technique and the patient's body habitus.  The trachea just above the thoracic inlet appears to be narrowed  significantly bilaterally over a 1.5 cm segment. The transverse diameter of  the trachea measured possibly 5 mm in the area of narrowing. Clinical  correlation is recommended has the patient had previous intubated or had a  tracheostomy.     IMPRESSION: 1. Apparent narrowing of the trachea just above the  thoracic inlet. Clinical  correlation is suggested         Signing/Reading Doctor: Marlou Sa [phone])   Approved: Marlou Sa ([phone])  Jun 27 2013  2:08PM                                      MEDICATIONS     ALL MEDICATIONS:   Current Facility-Administered Medications   Medication Dose Route Frequency   ??? amLODIPine (NORVASC) tablet 5 mg  5 mg Oral DAILY   ??? albuterol (PROVENTIL HFA, VENTOLIN HFA, PROAIR HFA) inhaler 2 Puff  2 Puff Inhalation Q4H PRN   ??? therapeutic multivitamin (THERAGRAN) tablet 1 Tab  1 Tab Oral DAILY   ??? ziprasidone (GEODON) 20 mg in sterile water (preservative free) injection  20 mg IntraMUSCular BID PRN   ??? OLANZapine (ZyPREXA) tablet 5 mg  5 mg Oral Q6H PRN   ??? benztropine (COGENTIN) tablet 2 mg  2 mg Oral BID PRN   ??? benztropine (COGENTIN) injection 2 mg  2 mg IntraMUSCular Q12H PRN   ??? LORazepam (ATIVAN) injection 2 mg  2 mg IntraMUSCular Q4H PRN   ??? LORazepam (ATIVAN) tablet 1 mg  1 mg Oral Q4H PRN   ??? zolpidem (AMBIEN) tablet 10 mg  10 mg Oral QHS PRN   ??? acetaminophen (TYLENOL) tablet 650 mg  650 mg Oral Q4H PRN   ??? ibuprofen (MOTRIN) tablet 400 mg  400 mg Oral Q8H PRN   ??? magnesium hydroxide (MILK OF  MAGNESIA) oral suspension 30 mL  30 mL Oral DAILY PRN   ??? nicotine (NICODERM CQ) 21 mg/24 hr patch 1 Patch  1 Patch TransDERmal DAILY PRN      SCHEDULED MEDICATIONS:   Current Facility-Administered Medications   Medication Dose Route Frequency   ??? amLODIPine (NORVASC) tablet 5 mg  5 mg Oral DAILY   ??? therapeutic multivitamin (THERAGRAN) tablet 1 Tab  1 Tab Oral DAILY            ASSESSMENT & PLAN     The patient, Antonio Potter, is a 53 y.o.  male who presents at this time for treatment of the following diagnoses: (reviewed/updated 06/30/2013)  Patient Active Hospital Problem List:   Psychosis NOS: VS: ? H/o Schizophrenia    Assessment: Patient is not grossly manic or psychotic. Demanding and rude. Refusing medications..  Plan: No psych medications will be offered at this time.   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                A coordinated, multidisplinary treatment team round was conducted with the patient; that includes the nurse, unit pharmcist, Catering manager all present.     The following regarding medications was addressed during rounds with patient:   the risks and benefits of the proposed medication. The patient was given the opportunity to ask questions. Informed consent given to the use of the above medications. Will continue to adjust psychiatric and non-psychiatric medications (see above "medication" section and orders section for details) as deemed appropriate & based upon diagnoses and response to treatment.     Will continue to order blood tests/labs and diagnostic tests as deemed appropriate and review results as they become available (see orders for details and above listed lab/test results).    Will order psychiatric records from previous psych  hospitals to further elucidate the nature of patient's psychopathology and review once available.    Will gather additional collateral information from  friends, family  and o/p treatment team to further elucidate the nature of patient's psychopathology and baselline level of psychiatric functioning.    Complete current electronic health record for patient has been reviewed today including consultant notes, ancillary staff notes, nurses and psychiatric tech notes    Will continue to provide individual, milieu, occupational, group, and substance abuse therapies to address target symptoms as deemed appropriate for the individual patient.      EXPECTED DISCHARGE DATE/DAY: Tomorrow     DISPOSITION: Shelter or ALF       Signed By:   Johnanna Schneiders, MD  06/30/2013

## 2013-06-30 NOTE — Behavioral Health Treatment Team (Signed)
Patient resting quietly in bed with eyes closed, respiration even and unlabored, no sign of any distress at this time. Slept a total of 7 hours. Patient will continue to be monitored per hospital protocol for safety and needs.

## 2013-06-30 NOTE — Behavioral Health Treatment Team (Signed)
Pt has been visible on the unit.Pt has been compliant with meals and medications.Pt denies SI/HI or AV hallucinations.Pt is loud and makes sexually inappropriate remarks with male staff.Pt counseled by staff on appropriate behavior.Will continue to set limits, Q5715minute checks and anticipate pt needs.

## 2013-07-01 MED ADMIN — therapeutic multivitamin (THERAGRAN) tablet 1 Tab: ORAL | @ 14:00:00

## 2013-07-01 MED ADMIN — amLODIPine (NORVASC) tablet 5 mg: ORAL | @ 14:00:00 | NDC 50268008411

## 2013-07-01 MED FILL — AMLODIPINE 5 MG TAB: 5 mg | ORAL | Qty: 1

## 2013-07-01 MED FILL — THERA 400 MCG TABLET: 400 mcg | ORAL | Qty: 1

## 2013-07-01 NOTE — Behavioral Health Treatment Team (Signed)
Patient seen, staffing held and discharge summary dictated   Please see dictated report for full details. Patient stable for discharge and considered to be at low risk of harm to self or others.  Informed consent given to the use of discharge medications.  Treatment rounds held in full.  Spent greater than 35 minutes on discharge work.

## 2013-07-01 NOTE — Behavioral Health Treatment Team (Cosign Needed)
GROUP THERAPY PROGRESS NOTE    Antonio Potter is participating in Community.     Group time: 30 minutes    Personal goal for participation: daily orientation    Goal orientation: personal    Group therapy participation: active    Therapeutic interventions reviewed and discussed: yes    Impression of participation: cooperative

## 2013-07-01 NOTE — Behavioral Health Treatment Team (Signed)
Pt slept approx 1 hr during this night.  Pt denies that he had trouble sleeping.  He claims that he usually stays up during the night.

## 2013-07-01 NOTE — Behavioral Health Treatment Team (Signed)
Social Work     Met with pt in team for treatment and discharge planning    Pt reported he felt "ok" and was ready to discharge.  The pt continued to request various services the hospital does not offer, however, worker guided him to follow up with the [facility] and [place] [facility] ([facility] for needs related to identification, disability, and housing.  The pt exhibited no signs of being a harm to himself or others and was discharged.  He was provided with bus tickets and map/directions to [facility]. He was referred to the [facility] for follow up and continuing care paperwork was faxed.     Delight Stare, LCSW

## 2013-07-01 NOTE — Behavioral Health Treatment Team (Signed)
Pt has been up in his room, awake since the beginning of this shift.  Pt denies that he is having trouble sleeping.  He states "I just don't want to sleep now...."  Pt offered sleeping pill but he refused.  No respiratory distress noted.  No c/o pain/discomfort voiced.  Pt talking to himself @ intervals.  Pt does follow staff redirection when limits were set that he could not be out in hallway or dayroom.

## 2013-07-01 NOTE — Behavioral Health Treatment Team (Signed)
Pt is alert and oriented x person, place and time. Pt denies any SI/HI or AV hallucinations. Pt denies any depression. Pt discharged to [facility]. Discharge information reviewed with patient. Pt verbalizes understanding. Pt's belongings/valuables returned. Bus tickets to pt and pt escorted off unit by staff.

## 2013-07-25 NOTE — ED Notes (Signed)
Pt is discharged home. Pt is ambulatory from the department with a steady gait. Pt has verbal understanding of discharge instructions. Pt is given 0 rx.

## 2013-07-25 NOTE — Other (Signed)
Patient seen by this writer at [facility] several hours after being discharged from [facility].      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  He stated that he is paranoid about being hurt by people he did drugs with 20 years ago.  He also reported hearing voices but admitted that this is an "everyday" occurrence.  The patient endorsed suicidal ideation and said that he tried to jump in front of a car 3-4 days ago because he was afraid that some men were going to hurt him.  He reported vague homicidal ideation but said "I don't know who."  He further indicated that he might want to hurt men that sell drugs or mistreat women.  Later in the conversation, the patient said that he has never been violent toward anyone.    This Clinical research associatewriter asked the patient about his visit [facility] today, and he was very evasive.  Per the [facility] assessment from [facility], the patient was [facility] today, which he did not want.  When this writer asked the patient about his recent stay at [facility], he asked, "what'd you hear?"  The [facility] staff at [facility] noted that she spoke with staff at [facility] who reported that the patient was given a 2 week supply of his medication, his Haldol dec on 3/25, a follow [facility] and brought to [facility] from [facility].    The patient presented as logical, linear and very goal directed.  He did not appear to be psychotic/responding to any internal stimuli or in any acute distress.  He was very calm.  Testing did not reveal any alcohol or illegal drug use.    This Clinical research associatewriter conferred with the on-call psychiatrist, Dr. Latanya Maudlinahlvani, who agreed that the patient should be discharged.  He was given contact information for both [facility] and the [facility].    It should be noted that the patient called [facility] and 911 from the ED.  This Clinical research associatewriter spoke with Tyra at [facility] and updated her on the patient's status.

## 2013-07-25 NOTE — ED Notes (Signed)
Marie with [facility] is here now to eval pt.

## 2013-07-25 NOTE — ED Notes (Signed)
Pt states that this has started 20 yrs ago. Pt has hx of schizophrenia and bipolar. Pt states that he has lost his medication. Pt states that he is hearing voices. Pt states that he is paranoid and he jumped out in front of a car. Pt is still paranoid. Pt [facility]. Pt states that the only thing they did for him over there was give him something to eat and told him to follow up with a mental health worker.

## 2013-07-25 NOTE — ED Notes (Signed)
Pt denied having discharge vitals taken. Pt given discharge instructions. Questions answered and pt states understanding, no distress noted, pt ambulated out of unit with bus tickets.

## 2013-07-25 NOTE — ED Provider Notes (Addendum)
Patient is a 53 y.o. male presenting with mental health disorder.   Mental Health Problem     ZOX:WRUEAVWHPI:Patient reports that he recently has been living in [place]. He was visiting his family in [place] apparently. He says he was off his medications and ended up at a bus station in [place].. He says he was hearing voices paranoid and suicidal. He was admitted at [facility] apparently for 2 weeks. He is back on his medications. Patient reportedly is homeless and trying to get back to [place]. He did not want to be discharged and refused to cooperate with his discharge planning at the other hospital. He was given 2 weeks supply of his medications and appointments [place] behavioral authority and the daily planner. Patient says she is still paranoid and hearing voices and suicidal. Patient is calm and cooperative at this time.      Past Medical History   Diagnosis Date   ??? Psychiatric disorder    ??? Schizophrenia    ??? Hypertension         Past Surgical History   Procedure Laterality Date   ??? Hx hemorrhoidectomy     ??? Hx cholecystectomy           History reviewed. No pertinent family history.     History     Social History   ??? Marital Status: SINGLE     Spouse Name: N/A     Number of Children: N/A   ??? Years of Education: N/A     Occupational History   ??? Not on file.     Social History Main Topics   ??? Smoking status: Never Smoker    ??? Smokeless tobacco: Not on file   ??? Alcohol Use: Yes      Comment: once every 6 months   ??? Drug Use: Not on file   ??? Sexual Activity: Not on file     Other Topics Concern   ??? Not on file     Social History Narrative                  ALLERGIES: Review of patient's allergies indicates no known allergies.      Review of Systems   All other systems reviewed and are negative.      There were no vitals filed for this visit.         Physical Exam   Constitutional:   obese   HENT:   Head: Normocephalic and atraumatic.   Mouth/Throat: Oropharynx is clear and moist.   Eyes: EOM are normal.  Pupils are equal, round, and reactive to light.   Neck: Normal range of motion. Neck supple.   Old trach scar   Cardiovascular: Normal rate, regular rhythm, normal heart sounds and intact distal pulses.  Exam reveals no gallop and no friction rub.    No murmur heard.  Pulmonary/Chest: Effort normal. No respiratory distress. He has no wheezes. He has no rales.   Abdominal: Soft. There is no tenderness. There is no rebound.   Musculoskeletal: Normal range of motion. He exhibits no tenderness.   Neurological: He is alert. No cranial nerve deficit.   Motor; symmetric   Skin: No erythema.   Psychiatric: He has a normal mood and affect. His behavior is normal.   Affect; normal; appearance; well cared for; behavior; calm and cooperative; cognition; normal        MDM    Procedures

## 2013-07-25 NOTE — ED Notes (Signed)
Pt was ambulatory to the bathroom and provided a urine specimen.

## 2013-07-25 NOTE — ED Provider Notes (Signed)
Patient is a 53 y.o. male presenting with mental health disorder. The history is provided by the patient. No language interpreter was used.   Mental Health Problem   This is a recurrent problem. Episode onset: just discharged [facility] ED for his mental illness. The problem has been gradually worsening. Associated symptoms include hallucinations. Pertinent negatives include no weakness and no agitation. Mental status baseline is normal.         Past Medical History   Diagnosis Date   ??? Psychiatric disorder    ??? Schizophrenia    ??? Hypertension         Past Surgical History   Procedure Laterality Date   ??? Hx hemorrhoidectomy     ??? Hx cholecystectomy           No family history on file.     History     Social History   ??? Marital Status: SINGLE     Spouse Name: N/A     Number of Children: N/A   ??? Years of Education: N/A     Occupational History   ??? Not on file.     Social History Main Topics   ??? Smoking status: Never Smoker    ??? Smokeless tobacco: Not on file   ??? Alcohol Use: Yes      Comment: once every 6 months   ??? Drug Use: Not on file   ??? Sexual Activity: Not on file     Other Topics Concern   ??? Not on file     Social History Narrative                  ALLERGIES: Review of patient's allergies indicates no known allergies.      Review of Systems   Constitutional: Negative for fever.   HENT: Negative for congestion.    Eyes: Negative for visual disturbance.   Respiratory: Negative for shortness of breath and wheezing.    Cardiovascular: Negative for chest pain.   Gastrointestinal: Negative for vomiting and abdominal pain.   Endocrine: Negative for polyuria.   Genitourinary: Negative for dysuria.   Musculoskeletal: Negative for back pain.   Skin: Negative for rash.   Allergic/Immunologic: Negative for immunocompromised state.   Neurological: Negative for weakness and headaches.   Psychiatric/Behavioral: Positive for hallucinations. Negative for agitation.       Filed Vitals:    07/25/13 1919   BP: 137/92   Pulse: 95    Temp: 97.7 ??F (36.5 ??C)   Resp: 20   Height: 6' (1.829 m)   Weight: 124.739 kg (275 lb)   SpO2: 96%            Physical Exam   Constitutional: He is oriented to person, place, and time. He appears well-nourished.   HENT:   Head: Atraumatic.   Eyes: EOM are normal.   Neck: Normal range of motion. Neck supple.   Cardiovascular: Normal rate and regular rhythm.  Exam reveals no gallop and no friction rub.    No murmur heard.  Pulmonary/Chest: Effort normal and breath sounds normal. No respiratory distress. He has no wheezes. He has no rales.   Abdominal: Soft. Bowel sounds are normal. He exhibits no distension. There is no tenderness.   Musculoskeletal: He exhibits no edema or tenderness.   Neurological: He is alert and oriented to person, place, and time.   Skin: Skin is warm and dry.   Psychiatric: He has a normal mood and affect.   Nursing note  and vitals reviewed.       MDM    Procedures

## 2013-07-25 NOTE — ED Notes (Signed)
Marie with [facility] called and is coming.

## 2013-07-25 NOTE — Other (Signed)
Comprehensive Assessment Form Part 1      Section I - Disposition    Axis I - Schizophrenia, undifferentiated   Axis II - Deferred-Antisocial traits  Axis III - HTN  Axis IV - Env't  Axis V - 55-60      The Medical Doctor to Psychiatrist conference was not completed. The Medical Doctor is in agreement with Psychiatrist disposition because of (reason) patient doesn't warrant inpt care  The plan is discharge and f/u with [facility] as planned next week and with [facility]  The on-call Psychiatrist consulted was Dr. Andria Meuse.  The admitting Psychiatrist will be NA  The admitting Diagnosis is NA.  The Payor source is Medicare [place].   Section II - Integrated Summary  Summary: Pt presents to ER seeking admission. He has been at [facility] since 07/17/13 and discharged today. Per patient, they released him too soon. Reports having SI/HI. Shared that he wants a bus ticket to [place] and advised that this wouldn't be happening. Patient very upset about not having a place to go. Talked about how he feels he needs to be int the hospital because he's still hearing voices. Reminded patient that he's lived with this illness for many years and that part of his responsibility is to follow up with appropriate outpatient care. Pt continued to ask to use the phone to "call 911 to take me somewhere else". Educated patient on abuse of EMS system and legal charges that can be involved. Patient begins to talk about being a veteran and how we can't discharge a veteran. Advised patient that if infact he is a veteran he can receive services via [facility] on Monday, but then stated this isn't true. No evidence of psychosis noted with patient.   Consulted with Pricilla Holm regarding d/c-shared that patient has responded well to treatment. He was d/c today with 2wks worth of all his RX and had Haldol Dec on March 25th. He was set up with an appt with [facility] next week and was sent via cab to [facility]. Per staff, pt was very upset about discharge and not sending  him via bus to [place]. Patient had to be escorted off the property as he refused to leave. Told RN "I'm just going to another hospital".   The patienthas demonstrated mental capacity to provide informed consent.  The information is given by the patient, EMS personnel and past medical records.  The Chief Complaint is SI "I was discharged too soon"  The Precipitant Factors are homelessness  Previous Hospitalizations: [facility]  The patient has been in restraints in the past and has not escaped from them.  Current Psychiatrist and/or Case Manager is [facility]    Lethality Assessment:    The potential for suicide noted by the following: not noted . The potential for homicide is not noted.  The patient has not been a perpetrator of sexual or physical abuse. There are not pending charges.  The patient is not felt to be at risk for self harm or harm to others. The attending nurse was advised .    Section III - Psychosocial  The patient's overall mood and attitude is irritable a. Feelings of helplessness and hopelessness are not observed. Generalized anxiety is not observed. Panic is not observed. Phobias are not observed. Obsessive compulsive tendencies are not observed.     Section IV - Mental Status Exam  The patient's appearance shows no evidence of impairment. The patient's behavior is cooperative. The patient is oriented to time,  place, person and situation. The patient's speech is loud . The patient's mood is i. The range of affect shows no evidence of impairment. The patient's thought content demonstrates no evidence of impairment. The thought process is logical. The patient's perception shows no evidence of impairment. The patient's memory shows no evidence of impairment. The patient's appetite shows no evidence of impairment. The patient's sleep shows no evidence of impairment. The patient shows appropriate insight. The patient's judgement shows no evidence of impairment.     Section V - Substance Abuse  The patient is  not using substances. History of drug use per report    Section VI - Living Arrangements  The patient is single. The patient is homeless. The patient does not have legal issues pending. The patient's source of income comes from disability.Religious and cultural practices have not been voiced at this time.  The patient's greatest support comes from unk and this person will not be involved with the treatment.   The patient has not been in an event described as horrible or outside the realm of ordinary life experience either currently or in the past.The patient has not been a victim of sexual/physical abuse.    Section VII - Other Areas of Clinical Concern  The highest grade achieved is HS grad with the overall quality of school experience being described as fair.  The patient is currently disabled and speaks [place] as a primary language. The patient has no communication impairments affecting communication. The patient's preference for learning can be described as: can read and write adequately. The patient's hearing is normal. The patient's vision is normal.      Ladora DanielMary Frances Santmier, LPC

## 2013-07-25 NOTE — ED Notes (Signed)
[facility], pt informed the facility that he was not ready to leave because he was still hearing voices. Pt states the voices are telling him to "kill the non-believer". Pt states that he is still feeling homicidal and suicidal.

## 2013-07-25 NOTE — ED Notes (Signed)
Pt eating tray in bed, no distress noted

## 2013-07-25 NOTE — ED Notes (Signed)
Pt is provided with a lunch bag and juice per his request and Dr. Karis JubaForest-Lam okay.

## 2013-07-25 NOTE — ED Notes (Signed)
All results are back and we are awaiting the arrival of Hilda LiasMarie with [facility].

## 2013-07-26 LAB — DRUG SCREEN, URINE
AMPHETAMINES: NEGATIVE
BARBITURATES: NEGATIVE
BENZODIAZEPINES: NEGATIVE
COCAINE: NEGATIVE
METHADONE: NEGATIVE
OPIATES: NEGATIVE
PCP(PHENCYCLIDINE): NEGATIVE
THC (TH-CANNABINOL): NEGATIVE

## 2013-07-26 LAB — METABOLIC PANEL, BASIC
Anion gap: 8 mmol/L (ref 5–15)
BUN/Creatinine ratio: 15 (ref 12–20)
BUN: 14 MG/DL (ref 6–20)
CO2: 35 mmol/L — ABNORMAL HIGH (ref 21–32)
Calcium: 8.4 MG/DL — ABNORMAL LOW (ref 8.5–10.1)
Chloride: 97 mmol/L (ref 97–108)
Creatinine: 0.94 MG/DL (ref 0.45–1.15)
GFR est AA: >60 mL/min/{1.73_m2} (ref >60)
GFR est non-AA: >60 mL/min/{1.73_m2} (ref >60)
Glucose: 86 mg/dL (ref 65–100)
Potassium: 3.8 mmol/L (ref 3.5–5.1)
Sodium: 140 mmol/L (ref 136–145)

## 2013-07-26 LAB — ETHYL ALCOHOL: ALCOHOL(ETHYL),SERUM: <10 MG/DL (ref <10)

## 2013-07-27 NOTE — Discharge Summary (Signed)
Name:       Antonio GitelmanDURHAM, Antonio Potter                  Admitted:    06/27/2013                                               Discharged:  07/01/2013  Account #:  [id number]                     DOB:         02-19-1961  Consultant: Adela LankSultan A Easter Kennebrew, MD             Age          53                                 DISCHARGE SUMMARY      DISCHARGE DIAGNOSES  AXIS I: Psychosis, not otherwise specified.  AXIS II: Personality disorder, not otherwise specified.  AXIS III: Hypertension.  AXIS IV: Mild to moderate.  AXIS V: 45 to 50.    HISTORY OF PRESENT ILLNESS: Please see initial psychiatric evaluation in  the chart by Dr. Lorie ApleySalim Dahlvani, dated 06/28/2013, for full details as well  as for description of mental status examination at the time of  presentation.    HOSPITALIZATION COURSE: The patient is a 53 year old, African American male  with a history of chronic psychotic illness, who was admitted under TDO for  refusing medications and behavioral disturbance. The patient was not  restarted on medications, as he was refusing to take medication over the  weekend.    I took over the care of the patient on 06/30/2013. The patient had a TDO  hearing scheduled for 07/01/2013. The patient was not willing to take any  medication. He was mildly psychotic and morbidly obese. He had received  multiple p.r.n. medications over the weekend, including Geodon, Zyprexa,  and Ambien. He also required p.r.n. Tylenol for chronic pain.    On 07/01/2013 the patient was committed. He continued to stay sexually  inappropriate, loud and mildly psychotic. The patient was not willing to  take any medications. The patient was [facility] followup. It was the impression by the team that the  patient had more personality disorder than psychotic features, and he was  threatening the staff.    On the day of discharge, the patient was free of suicidal or homicidal  ideation. The patient was sleeping and eating well. The  patient contracted  for safety outside of the hospital. The patient was considered to be at low  risk of harm to self or others.    DISPOSITION: The [facility].    PROGNOSIS: Poor.              Adela LankSultan A Tekeisha Hakim, MD    cc:    Adela LankSultan A Jae Bruck, MD      SAL/wmx; D: 07/27/2013 05:28 P; T: 07/27/2013 09:54 P; DOC# [id number]; Job#  [id number]

## 2013-09-09 LAB — ETHYL ALCOHOL: ALCOHOL(ETHYL),SERUM: <10 MG/DL (ref <10)

## 2013-09-09 LAB — DRUG SCREEN, URINE
AMPHETAMINES: NEGATIVE
BARBITURATES: NEGATIVE
BENZODIAZEPINES: NEGATIVE
COCAINE: NEGATIVE
METHADONE: NEGATIVE
OPIATES: NEGATIVE
PCP(PHENCYCLIDINE): NEGATIVE
THC (TH-CANNABINOL): NEGATIVE

## 2013-09-09 LAB — POC CHEM8
Anion gap (POC): 19 mmol/L — ABNORMAL HIGH (ref 5–15)
BUN (POC): 11 MG/DL (ref 9–20)
CO2 (POC): 29 MMOL/L (ref 21–32)
Calcium, ionized (POC): 1.17 MMOL/L (ref 1.12–1.32)
Chloride (POC): 98 MMOL/L (ref 98–107)
Creatinine (POC): 0.9 MG/DL (ref 0.6–1.3)
GFRAA, POC: >60 mL/min/{1.73_m2} (ref >60)
GFRNA, POC: >60 mL/min/{1.73_m2} (ref >60)
Glucose (POC): 102 MG/DL (ref 75–110)
Hematocrit (POC): 37 % (ref 36.6–50.3)
Hemoglobin (POC): 12.6 GM/DL (ref 12.1–17.0)
Potassium (POC): 4.2 MMOL/L (ref 3.5–5.1)
Sodium (POC): 141 MMOL/L (ref 136–145)

## 2013-09-09 NOTE — ED Notes (Signed)
Pt. Alert and oriented x, 3no distress. Came in cc of auditory hallucinations off and on, stated that he's hearing voices that make him paranoid. Hallucinations off and on for years. Denies si/hi.

## 2013-09-09 NOTE — Other (Signed)
Comprehensive Assessment Form Part 1    Section I - Disposition      The Medical Doctor to Psychiatrist conference was not completed.  The Medical Doctor is in agreement with Psychiatrist disposition because there is no lethality and no admission criteria  The plan is for pt to be discharged home with referrals to [facility] and [facility].    The on-call Psychiatrist consulted was Dr. Rachael Darbyhaudry.  The admitting Psychiatrist will be n/a  The admitting Diagnosis is Schizophrenia by history  The Payor source is medicare.    Section II - Integrated Summary  Summary:  Pt is a 53 year old male who came to the ED via EMS today after losing his wallet.  Pt states he has had Schizophrenia for years and lives in [place].  Pt [place] bus ticket [place] - writer explained that we do not have bus tickets.  Pt also reports that he is homeless and doesn't have a home in [place] or [place].  Pt states he is supposed to be taking Depakote and Haldol, but has been out for one week.  Writer asked pt why he hasn't followed up with the appointments that have been made for [facility] at [facility] or [facility] and pt states "I don't know."  Pt is not responding to internal stimuli - there is no evidence of psychosis, mania or depression.  Pt is calm, oriented, alert, euthymic and able to provide historical information.  Writer consulted with Dr. Rachael Darbyhaudry and he agrees with decision to discharge pt with referrals to [facility] and [facility].  There is no criteria for inpatient treatment as pt denies SI, HI and history of either.  Pt is able to provide for his basic needs and reports that he has been eating and sleeping.      The patient is deemed competent to provide informed consent.  The information is given by the patient and past medical records.  The Chief Complaint is pt wants admission because he ran out of his medications.  The Precipitant Factors are chronic mental illness.  [facility]   The patient has not previously been in restraints.  Current Psychiatrist and/or Case Manager is Dr. Allena [facility]Patel [facility].    Lethality Assessment:    The potential for suicide is noted by the following: not noted.  The potential for homicide is not noted.  The patient has not been a perpetrator of sexual or physical abuse.  There are not pending charges.  The patient is not felt to be at risk for self harm or harm to others.      Section III - Psychosocial  The patient's overall mood and attitude is calm and cooperative.  Feelings of helplessness and hopelessness are not observed.  Generalized anxiety is not observed.  Panic is not observed. Phobias are not observed.  Obsessive compulsive tendencies are not observed.      Section IV - Mental Status Exam  The patient's appearance shows no evidence of impairment.  The patient's behavior shows no evidence of impairment. The patient is oriented to time, place, person and situation.  The patient's speech shows no evidence of impairment.  The patient's mood  is euthymic.  The range of affect shows no evidence of impairment.  The patient's thought content  demonstrates no evidence of impairment.  The thought process shows no evidence of impairment.  The patient's perception shows no evidence of impairment. The patient's memory shows no  evidence of impairment.  The patient's appetite shows no evidence of impairment.  The patient's sleep shows no evidence of impairment. The patient's insight shows no evidence of impairment.  The patient's judgement shows no evidence of impairment.        Section V - Substance Abuse  The patient is not using substances.   Section VI - Living Arrangements  The patient is single.  The patient is homeless. The patient has no children.  The patient does not plan to return home upon discharge.  The patient does not have legal issues pending. The patient's source of income comes from unknown sources.  Religious and cultural practices  have not been voiced at this time.    The patient's greatest support comes from "God" and this person will be involved with the treatment.    The patient has not been in an event described as horrible or outside the realm of ordinary life experience either currently or in the past.  The patient has not been a victim of sexual/physical abuse.    Section VII - Other Areas of Clinical Concern  The highest grade achieved is 12th grade with the overall quality of school experience being described as WNL.  The patient is currently  unemployed and speaks [place] as a primary language.  The patient has no communication impairments affecting communication. The patient's preference for learning can be described as: can read and write adequately.  The patient's hearing is normal.  The patient's vision is normal .      Jacki ConesAmy Martin, LCSW

## 2013-09-09 NOTE — ED Notes (Signed)
Patient (s) was given copy of dc instructions and no script(s).  Patient (s) has verbalized understanding of instructions and script (s).  Patient given a current medication reconciliation form and verbalized understanding of their medications.   Patient (s) has verbalized understanding of the importance of discussing medications with  his or her physician or clinic they will be following up with.  Patient alert and oriented and in no acute distress.  Patient discharged home ambulatory with self.

## 2013-09-09 NOTE — ED Notes (Signed)
Verbal shift change report given to Shaaron AdlerNoel M Townes, RN (oncoming nurse) by Mathis FareAlbert, RN (offgoing nurse). Report included the following information SBAR and ED Summary.
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
## 2013-09-09 NOTE — ED Notes (Signed)
Pt sitting on bed watching tv, pt given a bag lunch and ginger ale.

## 2013-09-09 NOTE — ED Notes (Signed)
Pt. Alert and oriented x ,3 no distress. Still with hallucinations. Awaiting bsmart.

## 2013-09-10 NOTE — ED Notes (Signed)
Patient given a bus ticket and a transfer ticket
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
## 2013-09-10 NOTE — ED Notes (Signed)
Patient discharged by Etheridge, PAC

## 2013-09-10 NOTE — ED Notes (Signed)
patient [facility]

## 2013-09-10 NOTE — ED Notes (Signed)
Patient given a lunch tray

## 2013-09-10 NOTE — ED Provider Notes (Signed)
HPI Comments: 53 y.o. male with past medical history significant for Schizophrenia, Bipolar Disorder, and HTN who presents from Home via EMS with chief complaint of Hallucinations. [facility] for similar symptoms to those presented today. During that ED visit patient was [facility] and given resources for follow up before he was discharged Home.   Patient states worsening symptoms since discharge. Patient presents today with audial hallucinations, telling "him to go somewhere he shouldn't go". Patient states previous history of Schizophrenia and Bipolar Disorder, managed with Depakote and Haldol, however patient denies compliance with medications, but states he was able to take his medications today PTA. Pt denies SI or HI. Pt denies fever, chills, cough, congestion, SOB, chest pain, abdominal pain, nausea, vomiting, diarrhea, difficulty urinating, or dysuria. Pt denies any other acute medical complaints.     Patient denies suicidal or homicidal ideations.     There are no other acute medical concerns at this time.    Social hx: Tobacco No, Alcohol Yes    Significant PSHx: Cholecystectomy and Hemorrhoidectomy    PCP: Sharon SellerSYED REHMAN, MD    The history is provided by the patient.      Note written by Consuela MimesBrittney Agee, Scribe, as dictated by Lanell Personsara Brielyn Bosak, MD 1:56 AM    Past Medical History   Diagnosis Date   ??? Schizophrenia (HCC)    ??? Hypertension    ??? Psychiatric disorder      bipolar and schizophernia        Past Surgical History   Procedure Laterality Date   ??? Hx hemorrhoidectomy     ??? Hx cholecystectomy           No family history on file.     History     Social History   ??? Marital Status: SINGLE     Spouse Name: N/A     Number of Children: N/A   ??? Years of Education: N/A     Occupational History   ??? Not on file.     Social History Main Topics   ??? Smoking status: Never Smoker    ??? Smokeless tobacco: Not on file   ??? Alcohol Use: Yes      Comment: once every 6 months   ??? Drug Use: Yes      Special: Marijuana   ??? Sexual Activity: Not on file     Other Topics Concern   ??? Not on file     Social History Narrative        ALLERGIES: Review of patient's allergies indicates no known allergies.      Review of Systems   Constitutional: Negative for fever and chills.   HENT: Negative for congestion.    Respiratory: Negative for cough and shortness of breath.    Cardiovascular: Negative for chest pain.   Gastrointestinal: Negative for nausea, vomiting, abdominal pain and diarrhea.   Genitourinary: Negative for dysuria and difficulty urinating.   Psychiatric/Behavioral: Positive for hallucinations. Negative for suicidal ideas.   All other systems reviewed and are negative.      There were no vitals filed for this visit.         Physical Exam   Constitutional: He is oriented to person, place, and time. He appears well-developed and well-nourished.   HENT:   Head: Normocephalic and atraumatic.   Nose: Nose normal.   Eyes: Conjunctivae and EOM are normal. Pupils are equal, round, and reactive to light.   Neck: Normal range of motion.  Neck supple.   Cardiovascular: Normal rate, regular rhythm, normal heart sounds and intact distal pulses.    Pulmonary/Chest: Effort normal and breath sounds normal.   Abdominal: Soft. Bowel sounds are normal.   Musculoskeletal: Normal range of motion.   Neurological: He is oriented to person, place, and time. He has normal reflexes.   Skin: Skin is warm and dry.   Psychiatric: He has a normal mood and affect. His behavior is normal.   Nursing note and vitals reviewed.   Note written by Consuela MimesBrittney Agee, Scribe, as dictated by Lanell Personsara Severa Jeremiah, MD 1:56 AM    MDM    Procedures    [facility] does not meet criteria for admission. Patient requesting help back to [place].  Will discharge and have case management see patient in am

## 2013-09-10 NOTE — ED Notes (Signed)
Dr. Marks at the bedside.

## 2013-09-10 NOTE — ED Notes (Signed)
Pt discharged earlier today[place] of paranoia and hearing voices.

## 2013-09-10 NOTE — ED Notes (Signed)
TRIAGE: Patient arrives from [employer] for mental health evaluation. Patient states he is having hallucinations/paranoia. Patient has been seen x4 in [facility] hours [facility], including [facility] and [facility]. He denies any suicidal/homicidal ideation. He agrees to contract for safety in the Emergency Department.

## 2013-09-10 NOTE — ED Notes (Signed)
Pt given discharge instructions by Dr. Dawna PartMarks.  Pt had no questions regarding instructions.  Pt will follow up with case management in the morning.  Information provided to pt.

## 2013-09-10 NOTE — ED Provider Notes (Addendum)
Patient is a 53 y.o. male presenting with hallucinations. The history is provided by the patient.   Hallucinations   This is a chronic problem. Episode onset: Pt seen here yesterday for the same, [facility], and is now back for the same thing. The problem has not changed (Pt states he is still having hallucinations and he is bipolar.   Pt [facility] on both occasions and discharged home) since onset.Associated symptoms include hallucinations. Pertinent negatives include no self-injury. Mental status baseline is normal.         Past Medical History   Diagnosis Date   ??? Schizophrenia (HCC)    ??? Hypertension    ??? Psychiatric disorder      bipolar and schizophernia   ??? PE (pulmonary embolism) [facility](HCC)         Past Surgical History   Procedure Laterality Date   ??? Hx hemorrhoidectomy     ??? Hx cholecystectomy     ??? Hx tracheostomy           History reviewed. No pertinent family history.     History     Social History   ??? Marital Status: SINGLE     Spouse Name: N/A     Number of Children: N/A   ??? Years of Education: N/A     Occupational History   ??? Not on file.     Social History Main Topics   ??? Smoking status: Never Smoker    ??? Smokeless tobacco: Not on file   ??? Alcohol Use: Yes      Comment: once every 6 months   ??? Drug Use: Yes     Special: Marijuana   ??? Sexual Activity: Not on file     Other Topics Concern   ??? Not on file     Social History Narrative                  ALLERGIES: Review of patient's allergies indicates no known allergies.      Review of Systems   Psychiatric/Behavioral: Positive for hallucinations. Negative for self-injury.   All other systems reviewed and are negative.      Filed Vitals:    09/10/13 1212   BP: 116/84   Pulse: 96   Temp: 97.8 ??F (36.6 ??C)   Resp: 18   Height: 6' (1.829 m)   Weight: 136.079 kg (300 lb)   SpO2: 98%            Physical Exam   Constitutional: He is oriented to person, place, and time. He appears well-developed and well-nourished. No distress.   HENT:   Head:  Normocephalic and atraumatic.   Eyes: Conjunctivae are normal.   Cardiovascular: Normal rate, regular rhythm and normal heart sounds.    Pulmonary/Chest: Effort normal and breath sounds normal. No respiratory distress. He has no wheezes. He has no rales.   Abdominal: Soft. Bowel sounds are normal.   Musculoskeletal: Normal range of motion.   Neurological: He is alert and oriented to person, place, and time.   Skin: Skin is warm and dry.   Psychiatric: He has a normal mood and affect. His speech is normal. He is not agitated, not aggressive, not hyperactive, not withdrawn and not combative. He expresses inappropriate judgment. He expresses no homicidal and no suicidal ideation.   Vitals reviewed.       MDM  Number of Diagnoses or Management Options  Diagnosis management comments: DDX: malingering, psychosis  Amount and/or Complexity of Data Reviewed  Decide to obtain previous medical records or to obtain history from someone other than the patient: yes  Review and summarize past medical records: yes        Procedures      2:00 PM    I spoke with Selena BattenKim, Consult for [facility].  She went to evaluate pt and discussed with on call psychiatrist.  Pt is to be discharged again with [facility].    Alva GarnetAlonda Etheridge, PA-C
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
## 2013-09-10 NOTE — ED Notes (Signed)
Pt was provided transportation to the bus stop with bus tickets in hand. Pt ambulated to exit without difficulty.

## 2013-09-10 NOTE — ED Notes (Signed)
Patient verbally inappropriate with RN while RN completing triage interview. Patient states, "I know you know what to do with a man. I'm getting hard just thinking about it." Advised patient that this kind of conversation was inappropriate and that the nurses are here as professionals. Patient redirected with moderate difficulty. Patient requested something to eat, states, "Giving me something to eat is the way to my heart. Make sure you remember to get me something to eat." Curtain open for easy visualization from the nurses' station.

## 2013-09-10 NOTE — ED Notes (Signed)
Triage: Patient brought in my EMS for mental health evaluation.  Patient reports auditory and visual hallucinations.  Denies SI/HI.  Patient is voluntary.

## 2013-09-10 NOTE — ED Provider Notes (Signed)
HPI Comments: 53 y.o. male with past medical history significant for HTN, PE, bipolar disorder and schizophrenia who [place] for mental health evaluation. Pt reports paranoia and auditory hallucinations for 20+ years and states "they're trying to persecute me because I'm a Christian." When asked if sx are different than previous hx, pt states "It's been the same." Per medical records pt has been seen 4 times in the past 1-2 days in [facility] facilities for same sx. Denies any HA, fever, chills, cough, chest pain, dyspnea, abdominal pain, nausea, vomiting, diarrhea, dizziness, or syncope.    There are no other acute medical concerns at this time.    Significant PSHx: cholecystectomy, tracheostomy, hemorrhoidectomy  Significant FMHx: none  Social hx: never smoker, occasional etoh use, +marijuana use  PCP: SYED REHMAN, MD    Full history, physical exam, and ROS unable to be obtained due to:  psychosis.        The history is provided by the patient.    Note written by Orion CrookBrian M. Mikey BussingHoffman, Scribe, as dictated by Kathrene AluWilliam N Beryl Balz, MD 11:50 PM    Past Medical History   Diagnosis Date   ??? Schizophrenia (HCC)    ??? Hypertension    ??? Psychiatric disorder      bipolar and schizophernia   ??? PE (pulmonary embolism) [facility](HCC)         Past Surgical History   Procedure Laterality Date   ??? Hx hemorrhoidectomy     ??? Hx cholecystectomy     ??? Hx tracheostomy           History reviewed. No pertinent family history.     History     Social History   ??? Marital Status: SINGLE     Spouse Name: N/A     Number of Children: N/A   ??? Years of Education: N/A     Occupational History   ??? Not on file.     Social History Main Topics   ??? Smoking status: Never Smoker    ??? Smokeless tobacco: Not on file   ??? Alcohol Use: Yes      Comment: once every 6 months   ??? Drug Use: Yes     Special: Marijuana   ??? Sexual Activity: Not on file     Other Topics Concern   ??? Not on file     Social History Narrative                  ALLERGIES: Review of  patient's allergies indicates no known allergies.      Review of Systems   Unable to perform ROS: Psychiatric disorder   Note written by Orion CrookBrian M. Mikey BussingHoffman, Scribe, as dictated by Kathrene AluWilliam N Becky Colan, MD 11:50 PM      Filed Vitals:    09/10/13 2219   BP: 127/86   Temp: 98.2 ??F (36.8 ??C)   Resp: 18   Height: 6' (1.829 m)   Weight: 136.079 kg (300 lb)   SpO2: 94%            Physical Exam     CONSTITUTIONAL: Well-appearing; well-nourished; in no apparent distress  HEAD: Normocephalic; atraumatic  EYES: PERRL; EOM intact; conjunctiva and sclera are clear bilaterally.  ENT: No rhinorrhea; normal pharynx with no tonsillar hypertrophy; mucous membranes pink/moist, no erythema, no exudate.  NECK: Supple; non-tender; no cervical lymphadenopathy  CARD: Normal S1, S2; no murmurs, rubs, or gallops. Regular rate and rhythm.  RESP: Normal respiratory  effort; breath sounds clear and equal bilaterally; no wheezes, rhonchi, or rales.  ABD: Normal bowel sounds; non-distended; non-tender; no palpable organomegaly, no masses, no bruits.  Back Exam: Normal inspection; no vertebral point tenderness, no CVA tenderness. Normal range of motion.  EXT: Normal ROM in all four extremities; non-tender to palpation; no swelling or deformity; distal pulses are normal, no edema.  SKIN: Warm; dry; no rash.  NEURO:Alert and oriented x 3, coherent, NII-XII grossly intact, sensory and motor are non-focal.        MDM  Number of Diagnoses or Management Options  Schizophreniform disorder (HCC):   Unspecified personality disorder:   Diagnosis management comments: Assessment: 53 year old male with psychosis and malingering and homeless    Plan: [facility] Consult for psychiatric evaluation and treatment/ Monitor and Reevaluate.         Amount and/or Complexity of Data Reviewed  Clinical lab tests: ordered and reviewed  Tests in the radiology section of CPT??: ordered and reviewed  Tests in the medicine section of CPT??: reviewed and ordered  Discussion of test results  with the performing providers: yes  Decide to obtain previous medical records or to obtain history from someone other than the patient: yes  Obtain history from someone other than the patient: yes  Review and summarize past medical records: yes  Discuss the patient with other providers: yes  Independent visualization of images, tracings, or specimens: yes    Risk of Complications, Morbidity, and/or Mortality  Presenting problems: moderate  Diagnostic procedures: moderate  Management options: moderate        Procedures    Progress Note:   Pt has been reexamined by Kathrene AluWilliam N Evlyn Amason, MD. All available results have been reviewed with pt and any available family. The patient was medically clear. [facility] who recommends discharge from the hospital for outpatient treatment. Pt understands sx, dx, and tx in ED. Care plan has been outlined and questions have been answered. Pt is ready to go home. Will send home on psychosis and schizophrenia instructions.. [facility] as needed. Written by Kathrene AluWilliam N Michalla Ringer, MD,8:35 AM    .   .

## 2013-09-10 NOTE — ED Notes (Signed)
[facility] at the bedside.

## 2013-09-11 LAB — URINALYSIS W/ REFLEX CULTURE
Bacteria: NEGATIVE /hpf
Bilirubin: NEGATIVE
Blood: NEGATIVE
Glucose: NEGATIVE mg/dL
Ketone: NEGATIVE mg/dL
Leukocyte Esterase: NEGATIVE
Nitrites: NEGATIVE
Protein: NEGATIVE mg/dL
Specific gravity: 1.012 (ref 1.003–1.030)
Urobilinogen: 0.2 EU/dL (ref 0.2–1.0)
pH (UA): 7.5 (ref 5.0–8.0)

## 2013-09-11 LAB — DRUG SCREEN, URINE
AMPHETAMINES: NEGATIVE
BARBITURATES: NEGATIVE
BENZODIAZEPINES: NEGATIVE
COCAINE: NEGATIVE
METHADONE: NEGATIVE
OPIATES: NEGATIVE
PCP(PHENCYCLIDINE): NEGATIVE
THC (TH-CANNABINOL): NEGATIVE

## 2013-09-11 NOTE — Other (Signed)
[facility] saw PT per request of ERMD.  He has been seen 3 times prior to this evaluation  today.  He has been verbally aggressive toward staff.  He denied lethality and reports he needs detox and a place to live. Pt was referred to [facility]

## 2014-07-06 ENCOUNTER — Inpatient Hospital Stay
Admit: 2014-07-06 | Discharge: 2014-07-06 | Disposition: A | Discharge status: Home / Self Care | Payer: MEDICARE | Attending: Internal Medicine

## 2014-07-06 DIAGNOSIS — F418 Other specified anxiety disorders: Secondary | ICD-10-CM

## 2014-07-06 LAB — METABOLIC PANEL, BASIC
Anion gap: 5 mmol/L (ref 5–15)
BUN/Creatinine ratio: 19 (ref 12–20)
BUN: 19 MG/DL (ref 6–20)
CO2: 34 mmol/L — ABNORMAL HIGH (ref 21–32)
Calcium: 8.6 MG/DL (ref 8.5–10.1)
Chloride: 102 mmol/L (ref 97–108)
Creatinine: 1.01 MG/DL (ref 0.70–1.30)
GFR est AA: >60 mL/min/{1.73_m2} (ref >60)
GFR est non-AA: >60 mL/min/{1.73_m2} (ref >60)
Glucose: 96 mg/dL (ref 65–100)
Potassium: 3.7 mmol/L (ref 3.5–5.1)
Sodium: 141 mmol/L (ref 136–145)

## 2014-07-06 LAB — ETHYL ALCOHOL: ALCOHOL(ETHYL),SERUM: <10 MG/DL (ref <10)

## 2014-07-06 LAB — CBC WITH AUTOMATED DIFF
ABS. BASOPHILS: 0 10*3/uL (ref 0.0–0.1)
ABS. EOSINOPHILS: 0.1 10*3/uL (ref 0.0–0.4)
ABS. LYMPHOCYTES: 1.5 10*3/uL (ref 0.8–3.5)
ABS. MONOCYTES: 0.8 10*3/uL (ref 0.0–1.0)
ABS. NEUTROPHILS: 8 10*3/uL (ref 1.8–8.0)
BASOPHILS: 0 % (ref 0–1)
EOSINOPHILS: 1 % (ref 0–7)
HCT: 44.4 % (ref 36.6–50.3)
HGB: 13.9 g/dL (ref 12.1–17.0)
LYMPHOCYTES: 15 % (ref 12–49)
MCH: 29.1 PG (ref 26.0–34.0)
MCHC: 31.3 g/dL (ref 30.0–36.5)
MCV: 92.9 FL (ref 80.0–99.0)
MONOCYTES: 7 % (ref 5–13)
NEUTROPHILS: 77 % — ABNORMAL HIGH (ref 32–75)
PLATELET: 272 10*3/uL (ref 150–400)
RBC: 4.78 M/uL (ref 4.10–5.70)
RDW: 13.6 % (ref 11.5–14.5)
WBC: 10.4 10*3/uL (ref 4.1–11.1)

## 2014-07-06 NOTE — ED Notes (Signed)
Pt not able to void right now, given diet ginger ale to drink.

## 2014-07-06 NOTE — ED Notes (Signed)
Pt presents ambulatory to ED accompanied by EMS complaining of anxiety and depression. Pt denies SI/HI. Pt noted to be speaking extremely fast and loud,  And is carrying on full conversations with himself. Pt appears very manic. Pt laughing very loudly and singing. Pt also stating that he is related to Malachi BondsEddie Murphy and that he has several famous cousins. Pt stating "I was almost famous too but I decided to stay in the back and manage all the money." Pt is calm and cooperative    Per EMS, pt was found [place] [employer] bus station. Pt relays that he just came back from [place] and that he was trying to get a ride to [place]. Pt [facility] for similar psych complaints yesterday. Pt stating he has been off all of his meds x "a while." Pt is alert and oriented x 4, RR even and unlabored, skin is warm and dry. Assesment completed and pt updated on plan of care.

## 2014-07-06 NOTE — ED Provider Notes (Signed)
HPI Comments: Antonio Potter is a 54 y.o. male who presents via EMS to the ED c/o progressively increased anxiety and depression s/p the death of family members recently. The pt states he usually takes Halidol but denies compliance over the last 2 months. When asked where he lives the pt states, "up and down the [employer] EMS reports picking the pt up off the corner nearest to the police station.     PCP: Sharon Seller, MD    Allergies: NKDA  PMHx: Significant for schizophrenia, HTN, bipolar, PE, asthma, DM, depression   PSHx: Significant for hemorrhoidectomy, cholecystectomy, tracheostomy  Social Hx: -tobacco, +EtOH, -Illicit Drugs    There are no other complaints, changes, or physical findings at this time.   Written by Lorenda Ishihara, ED Scribe, as dictated by Levora Angel, MD     The history is provided by the patient and the EMS personnel.        Past Medical History:   Diagnosis Date   ??? Schizophrenia (HCC)    ??? Hypertension    ??? Psychiatric disorder      bipolar and schizophernia   ??? PE (pulmonary embolism) (HCC)    ??? Asthma    ??? Diabetes (HCC)      Type II   ??? Depression    ??? Anxiety    ??? Chronic pain      in back       Past Surgical History:   Procedure Laterality Date   ??? Hx hemorrhoidectomy     ??? Hx cholecystectomy     ??? Hx tracheostomy           History reviewed. No pertinent family history.    History     Social History   ??? Marital Status: SINGLE     Spouse Name: N/A   ??? Number of Children: N/A   ??? Years of Education: N/A     Occupational History   ??? Not on file.     Social History Main Topics   ??? Smoking status: Never Smoker    ??? Smokeless tobacco: Not on file   ??? Alcohol Use: Yes      Comment: once every 6 months   ??? Drug Use: Yes     Special: Marijuana   ??? Sexual Activity: Not on file     Other Topics Concern   ??? Not on file     Social History Narrative           ALLERGIES: Review of patient's allergies indicates no known allergies.      Review of Systems   Constitutional: Negative.     HENT: Negative.    Eyes: Negative.    Respiratory: Negative.    Cardiovascular: Negative.    Gastrointestinal: Negative.    Genitourinary: Negative.    Musculoskeletal: Negative.    Skin: Negative.    Neurological: Negative.    Psychiatric/Behavioral: The patient is nervous/anxious.    All other systems reviewed and are negative.      Filed Vitals:    07/06/14 0443   BP: 131/99   Pulse: 85   Resp: 18   Height: 6' (1.829 m)   Weight: 136.079 kg (300 lb)   SpO2: 97%            Physical Exam   Constitutional: He is oriented to person, place, and time. He appears well-developed and well-nourished. No distress.   HENT:   Head: Normocephalic and atraumatic.   Mouth/Throat: Oropharynx  is clear and moist.   Eyes: Conjunctivae and EOM are normal. Pupils are equal, round, and reactive to light. No scleral icterus.   Neck: Normal range of motion. Neck supple.   Cardiovascular: Normal rate and regular rhythm.  Exam reveals no gallop.    No murmur heard.  Pulmonary/Chest: Effort normal and breath sounds normal. No stridor. No respiratory distress. He has no wheezes. He has no rales.   Abdominal: Soft. Bowel sounds are normal. He exhibits no distension and no mass. There is no tenderness. There is no rebound and no guarding.   Musculoskeletal: Normal range of motion. He exhibits no edema.   Lymphadenopathy:     He has no cervical adenopathy.   Neurological: He is alert and oriented to person, place, and time. No cranial nerve deficit. Coordination normal.   Skin: Skin is warm and dry. No rash noted. No erythema.   Psychiatric:   Rambling speech but mostly coherent.  No distress.     Nursing note and vitals reviewed.  Written by Lorenda Ishiharaanielle K Seibert, ED Scribe, as dictated by Levora AngelEvan P Gifford Ballon, MD     MDM  Number of Diagnoses or Management Options  Diagnosis management comments: DDx: schizophrenia, schizophreniform disorder, personality disorder, drug intoxication, malingering.       Amount and/or Complexity of Data Reviewed   Clinical lab tests: ordered and reviewed  Review and summarize past medical records: yes    Patient Progress  Patient progress: stable      Procedures     CONSULT NOTE:   5:10 AM  Levora AngelEvan P Vallie Fayette, MD spoke with Selena BattenKim,   Specialty: [facility]  Discussed pt's hx, disposition, and available diagnostic and imaging results. Reviewed care plans. Consultant will evaluate pt in ED.  Written by Lorenda Ishiharaanielle K Seibert, ED Scribe, as dictated by Levora AngelEvan P Livingston Denner, MD.     PROGRESS NOTE:  5:18 AM  [facility] to evaluate pt.   Written by Lorenda Ishiharaanielle K Seibert, ED Scribe, as dictated by Levora AngelEvan P Braeleigh Pyper, MD     PROGRESS NOTE:  5:46 AM   [facility] has spoken with Dr. Emelia Loronhalvani who does not feel that pt meets criteria for admission. Will provide pt with a list of resources for psychiatric aid.   Written by Lorenda Ishiharaanielle K Seibert, ED Scribe, as dictated by Levora AngelEvan P Marvell Stavola, MD     LABORATORY TESTS:  Recent Results (from the past 12 hour(s))   CBC WITH AUTOMATED DIFF    Collection Time: 07/06/14  4:59 AM   Result Value Ref Range    WBC 10.4 4.1 - 11.1 K/uL    RBC 4.78 4.10 - 5.70 M/uL    HGB 13.9 12.1 - 17.0 g/dL    HCT 96.044.4 45.436.6 - 09.850.3 %    MCV 92.9 80.0 - 99.0 FL    MCH 29.1 26.0 - 34.0 PG    MCHC 31.3 30.0 - 36.5 g/dL    RDW 11.913.6 14.711.5 - 82.914.5 %    PLATELET 272 150 - 400 K/uL    NEUTROPHILS 77 (H) 32 - 75 %    LYMPHOCYTES 15 12 - 49 %    MONOCYTES 7 5 - 13 %    EOSINOPHILS 1 0 - 7 %    BASOPHILS 0 0 - 1 %    ABS. NEUTROPHILS 8.0 1.8 - 8.0 K/UL    ABS. LYMPHOCYTES 1.5 0.8 - 3.5 K/UL    ABS. MONOCYTES 0.8 0.0 - 1.0 K/UL    ABS. EOSINOPHILS  0.1 0.0 - 0.4 K/UL    ABS. BASOPHILS 0.0 0.0 - 0.1 K/UL   METABOLIC PANEL, BASIC    Collection Time: 07/06/14  4:59 AM   Result Value Ref Range    Sodium 141 136 - 145 mmol/L    Potassium 3.7 3.5 - 5.1 mmol/L    Chloride 102 97 - 108 mmol/L    CO2 34 (H) 21 - 32 mmol/L    Anion gap 5 5 - 15 mmol/L    Glucose 96 65 - 100 mg/dL    BUN 19 6 - 20 MG/DL    Creatinine 1.30 8.65 - 1.30 MG/DL     BUN/Creatinine ratio 19 12 - 20      GFR est AA >60 >60 ml/min/1.59m2    GFR est non-AA >60 >60 ml/min/1.89m2    Calcium 8.6 8.5 - 10.1 MG/DL   ETHYL ALCOHOL    Collection Time: 07/06/14  4:59 AM   Result Value Ref Range    ALCOHOL(ETHYL),SERUM <10 <10 MG/DL       MEDICATIONS GIVEN:  Medications - No data to display    IMPRESSION:  1. Psychosis, unspecified psychosis type        PLAN:  1. F/U with Dr. Karilyn Cota in 1 day  Return to ED if worse     DISCHARGE NOTE:  6:18 AM  The patient's results have been reviewed with family and/or caregiver. They verbally convey their understanding and agreement of the patient's signs, symptoms, diagnosis, treatment, and prognosis. They additionally agree to follow up as recommended in the discharge instructions or to return to the Emergency Room should the patient's condition change prior to their follow-up appointment. The family and/or caregiver verbally agrees with the care-plan and all of their questions have been answered. The discharge instructions have also been provided to the them along with educational information regarding the patient's diagnosis and a list of reasons why the patient would want to return to the ER prior to their follow-up appointment should their condition change.  Written by Lorenda Ishihara, ED Scribe, as dictated by Levora Angel, MD               This note is prepared by Lorenda Ishihara, acting as Scribe for Levora Angel, MD    Levora Angel, MD : The scribe's documentation has been prepared under my direction and personally reviewed by me in its entirety. I confirm that the note above accurately reflects all work, treatment, procedures, and medical decision making performed by me.

## 2014-07-06 NOTE — ED Notes (Signed)
Discharge instructions were given to the patient by Valentina Shaggyhristina M Graceyn Fodor, RN\\.     The patient left the Emergency Department ambulatory, alert and oriented and in no acute distress with 0 prescriptions. The patient was encouraged to call or return to the ED for worsening issues or problems and was encouraged to schedule a follow up appointment for continuing care.     The patient verbalized understanding of discharge instructions and prescriptions, all questions were answered. The patient has no further concerns at this time.

## 2014-07-06 NOTE — Other (Signed)
Comprehensive Assessment Form Part 1      Section I - Disposition    Axis I - Malingering   Axis II - Deferred  Axis III -   Past Medical History   Diagnosis Date   ??? Schizophrenia (HCC)    ??? Hypertension    ??? Psychiatric disorder      bipolar and schizophernia   ??? PE (pulmonary embolism) (HCC)    ??? Asthma    ??? Diabetes (HCC)      Type II   ??? Depression    ??? Anxiety    ??? Chronic pain      in back       Axis IV - Homeless, lack of support  Axis V - 45      The Medical Doctor to Psychiatrist conference was not completed.  The Medical Doctor is in agreement with Psychiatrist disposition because of (reason) Inpatient hospitalization is not recommended.  The plan is discharge and referred to [facility] or [facility].  Patient also given Central Intake information.  The on-call Psychiatrist consulted was Dr. Latanya Maudlinahlvani.  The admitting Psychiatrist will be Dr. Ian BushmanNA.  The admitting Diagnosis is NA.  The Payor source is Medicare.     Section II - Integrated Summary  Summary:  Patient arrived by squad. Patient reported he called police and squad because he was sick.  Patient listed multiple complaints such as weakness, anxiety, depression, high blood pressure, ankle problems, back problems, "about to choke", jagged teeth, and blindness.  Patient reported he is supposed to be on Haldol for depression and anxiety but hasn't had in for 2 months.  This is questionable.  Patient presents as euphoric, laughing, singing, talking loud, and making jokes.  Patient seen at [facility] yesterday and discharged.  He was seen 3 times in the same day in May, 2015 and discharged each time with the diagnosis of malingering.  Patient had prior admissions June, 2014 and February, 2015.  Patient [place] for [place] go [place].  Advised that we could not do this.  Patient denied SI, HI or hallucinations.  The patienthas demonstrated mental capacity to provide informed consent.   The information is given by the patient and past medical records.  The Chief Complaint is multiple complaints.  The Precipitant Factors are homeless.  Previous Hospitalizations: Yes  Current Psychiatrist and/or Case Manager is NA.    Lethality Assessment:    The potential for suicide noted by the following: Patient denied SI.  The potential for homicide is not noted.  The patient has not been a perpetrator of sexual or physical abuse.  There are not pending charges.  The patient is not felt to be at risk for self harm or harm to others.  The attending nurse was advised that security has not been notified.    Section III - Psychosocial  The patient's overall mood and attitude is euphoric.  Feelings of helplessness and hopelessness are not observed.  Generalized anxiety is not observed.  Panic is not observed. Phobias are not observed.  Obsessive compulsive tendencies are not observed.      Section IV - Mental Status Exam  The patient's appearance is unkempt and shows poor hygiene.  The patient's behavior shows no evidence of impairment. The patient is oriented to time, place, person and situation.  The patient's speech is loud.  The patient's mood is euphoric.  The range of affect is innappropriate.  The patient's thought content demonstrates  no evidence of impairment.  The thought process shows no evidence of impairment.  The patient's perception shows no evidence of impairment. The patient's memory shows no evidence of impairment.  The patient's appetite is decreased and does not show  signs of weight loss but reported loss of 200 lbs.  The patient's sleep has evidence of insomnia. The patient shows no insight.  The patient's judgement is psychologically impaired.                  Section V - Substance Abuse  The patient is using substances.  The patient is using alcohol for unknown with last use on 3 days ago. The patient has experienced the following withdrawal symptoms: N/A.       Section VI - Living Arrangements  The patient is single.  The patient lives alone. The patient has no children.  The patient does not plan to return home upon discharge.  The patient does not have legal issues pending. The patient's source of income comes from disability.  Religious and cultural practices have not been voiced at this time.    The patient's greatest support comes from friends in [place] and this person will not be involved with the treatment.    The patient has not been in an event described as horrible or outside the realm of ordinary life experience either currently or in the past.  The patient has not been a victim of sexual/physical abuse.    Section VII - Other Areas of Clinical Concern  The highest grade achieved is 12 with the overall quality of school experience being described as not assessed.  The patient is currently disabled and speaks [place] as a primary language.  The patient has no communication impairments affecting communication. The patient's preference for learning can be described as: can read and write adequately.  The patient's hearing is normal.  The patient's vision is normal.      KIMBERLY G SALE, LPC

## 2015-02-08 ENCOUNTER — Emergency Department (HOSPITAL_COMMUNITY)
Admission: EM | Admit: 2015-02-08 | Discharge: 2015-02-09 | Disposition: A | Discharge status: Home / Self Care | Payer: Medicare Other | Attending: Emergency Medicine | Admitting: Emergency Medicine

## 2015-02-08 ENCOUNTER — Encounter (HOSPITAL_COMMUNITY): Payer: Self-pay | Admitting: Emergency Medicine

## 2015-02-08 DIAGNOSIS — M545 Low back pain: Secondary | ICD-10-CM | POA: Insufficient documentation

## 2015-02-08 DIAGNOSIS — R440 Auditory hallucinations: Secondary | ICD-10-CM | POA: Diagnosis not present

## 2015-02-08 DIAGNOSIS — G8929 Other chronic pain: Secondary | ICD-10-CM | POA: Insufficient documentation

## 2015-02-08 DIAGNOSIS — J45901 Unspecified asthma with (acute) exacerbation: Secondary | ICD-10-CM | POA: Insufficient documentation

## 2015-02-08 DIAGNOSIS — Z930 Tracheostomy status: Secondary | ICD-10-CM | POA: Diagnosis not present

## 2015-02-08 HISTORY — DX: Dorsalgia, unspecified: M54.9

## 2015-02-08 HISTORY — DX: Delusional disorders: F22

## 2015-02-08 HISTORY — DX: Unspecified asthma, uncomplicated: J45.909

## 2015-02-08 LAB — COMPREHENSIVE METABOLIC PANEL
ALBUMIN: 3.6 g/dL (ref 3.5–5.0)
ALK PHOS: 68 U/L (ref 38–126)
ALT: 24 U/L (ref 17–63)
AST: 21 U/L (ref 15–41)
Anion gap: 4 — ABNORMAL LOW (ref 5–15)
BILIRUBIN TOTAL: 0.6 mg/dL (ref 0.3–1.2)
BUN: 22 mg/dL — AB (ref 6–20)
CALCIUM: 9.1 mg/dL (ref 8.9–10.3)
CO2: 32 mmol/L (ref 22–32)
CREATININE: 1.05 mg/dL (ref 0.61–1.24)
Chloride: 103 mmol/L (ref 101–111)
GFR calc Af Amer: >60 mL/min (ref >60)
GLUCOSE: 74 mg/dL (ref 65–99)
POTASSIUM: 4.2 mmol/L (ref 3.5–5.1)
Sodium: 139 mmol/L (ref 135–145)
TOTAL PROTEIN: 6.8 g/dL (ref 6.5–8.1)

## 2015-02-08 LAB — CBC WITH DIFFERENTIAL/PLATELET
BASOS PCT: 0 %
Basophils Absolute: 0 10*3/uL (ref 0.0–0.1)
EOS PCT: 4 %
Eosinophils Absolute: 0.3 10*3/uL (ref 0.0–0.7)
HEMATOCRIT: 41.5 % (ref 39.0–52.0)
Hemoglobin: 13 g/dL (ref 13.0–17.0)
Lymphocytes Relative: 27 %
Lymphs Abs: 2.4 10*3/uL (ref 0.7–4.0)
MCH: 28.7 pg (ref 26.0–34.0)
MCHC: 31.3 g/dL (ref 30.0–36.0)
MCV: 91.6 fL (ref 78.0–100.0)
MONO ABS: 0.8 10*3/uL (ref 0.1–1.0)
MONOS PCT: 9 %
NEUTROS ABS: 5.2 10*3/uL (ref 1.7–7.7)
Neutrophils Relative %: 60 %
PLATELETS: 227 10*3/uL (ref 150–400)
RBC: 4.53 MIL/uL (ref 4.22–5.81)
RDW: 14.2 % (ref 11.5–15.5)
WBC: 8.7 10*3/uL (ref 4.0–10.5)

## 2015-02-08 LAB — RAPID URINE DRUG SCREEN, HOSP PERFORMED
Amphetamines: NOT DETECTED
BARBITURATES: NOT DETECTED
Benzodiazepines: NOT DETECTED
COCAINE: NOT DETECTED
Opiates: NOT DETECTED
Tetrahydrocannabinol: NOT DETECTED

## 2015-02-08 LAB — ETHANOL: Alcohol, Ethyl (B): <5 mg/dL (ref <5)

## 2015-02-08 NOTE — ED Notes (Signed)
Patient sleeping during vitals. This tech tried to wake patient. Patient opened eyes and then went back to sleep.

## 2015-02-08 NOTE — ED Notes (Addendum)
Patient has 2 patient belonging bags. Patient bags are clothing, wallet, and shoes. Patient also has an envelope with security containing watch, wallet, coins $12.42, social security card, and bills $211.00. Patient has been wanded by security.

## 2015-02-08 NOTE — ED Notes (Signed)
Patient transferred from Triage area.  Oriented to the unit and to his surroundings.  Patient was given a cold drink.  He complains of hearing voices and has religious delusions.

## 2015-02-08 NOTE — Progress Notes (Addendum)
Patient referred for inpatient psych treatment at: [facility] - per Julio Alm, fax referral for review, possible bed open. [facility] - left voicemail. [facility] - per intake, fax referral for the waitlist. [facility] - per Suann Larry, adult male and male beds open, also 1 male geriatric bed tonight. [facility] - per intake, fax referral for review.  At capacity: [facility] - per [facility] - per Baker Pierini - per Fidela Salisbury, [place] Disposition staff 02/08/2015 10:04 PM

## 2015-02-08 NOTE — ED Notes (Signed)
Patient observed lying on stretcher watching television. He denies SI and HI. He is calm and cooperative. He states he is hearing voices but they only "talk of my past". He also states he is seeing people from his past. Q 15 minute checks maintained and in progress. Patient monitoring continues.

## 2015-02-08 NOTE — ED Notes (Signed)
Bed: [facility] Expected date:  Expected time:  Means of arrival:  Comments: EMS- 54yo M, paranoia

## 2015-02-08 NOTE — ED Notes (Signed)
Pt presents via EMS for anxiety, SOB. C/o lower back pain. Hx of paranoia.   Last VS: 140palpated, 107hr, 24resp. 95%RA

## 2015-02-08 NOTE — BH Assessment (Signed)
Consulted with Nanine Means, DNP who recommends inpatient treatment at this time.   Davina Poke, LCSW Therapeutic Triage Specialist [facility] 02/08/2015 4:30 PM

## 2015-02-08 NOTE — ED Provider Notes (Signed)
CSN: [id number]     Arrival date & time 02/08/15  1317 History   First MD Initiated Contact with Patient 02/08/15 1428     No chief complaint on file.  Chief complaint hearing voices. "I'm paranoid"  (Consider location/radiation/quality/duration/timing/severity/associated sxs/prior Treatment) HPI Patient reports feelings of paranoia and is hearing voices and feeling that people are going to harm him for approximately the past month. Patient has been out of Haldol and Cogentin for at least one month. He denies other complaint.. 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 Past Medical History  Diagnosis Date  . Back pain   . Asthma   . Paranoia (HCC)    paranoid schizophrenia Past Surgical History  Procedure Laterality Date  . Cholecystectomy     No family history on file. Social History  Substance Use Topics  . Smoking status: Never Smoker   . Smokeless tobacco: Not on file  . Alcohol Use: No    no drug use. Homeless Review of Systems  Constitutional: Negative.   HENT: Negative.   Respiratory: Positive for shortness of breath.        Chronic dyspnea   Cardiovascular: Negative.   Gastrointestinal: Negative.   Musculoskeletal: Positive for back pain.       Chronic back pain  Skin: Negative.   Neurological: Negative.   Psychiatric/Behavioral: Positive for hallucinations and dysphoric mood.  All other systems reviewed and are negative.     Allergies  Review of patient's allergies indicates not on file.  Home Medications   Prior to Admission medications   Not on File   BP 125/73 mmHg  Pulse 96  Temp(Src) 97.9 F (36.6 C) (Oral)  Resp 18  SpO2 95% Physical Exam  Constitutional: He appears well-developed and well-nourished. He appears distressed.  Sleeping easily arousable Glasgow Coma Score 15  HENT:  Head: Normocephalic and atraumatic.  Eyes: Conjunctivae are normal. Pupils are equal, round, and reactive to light.  Neck: Neck  supple. No tracheal deviation present. No thyromegaly present.  Tracheostomy in place  Cardiovascular: Normal rate and regular rhythm.   No murmur heard. Pulmonary/Chest: Effort normal and breath sounds normal.  Abdominal: Soft. Bowel sounds are normal. He exhibits no distension. There is no tenderness.  Morbidly obese  Musculoskeletal: Normal range of motion. He exhibits no edema or tenderness.  Neurological: He is alert. No cranial nerve deficit. Coordination normal.  Gait normal  Skin: Skin is warm and dry. No rash noted.  Psychiatric: He has a normal mood and affect.  Nursing note and vitals reviewed.   ED Course  Procedures (including critical care time) Labs Review Labs Reviewed - No data to display  Imaging Review No results found. I have personally reviewed and evaluated these images and lab results as part of my medical decision-making.   EKG Interpretation None     Results for orders placed or performed during the hospital encounter of 02/08/15  Comprehensive metabolic panel  Result Value Ref Range   Sodium 139 135 - 145 mmol/L   Potassium 4.2 3.5 - 5.1 mmol/L   Chloride 103 101 - 111 mmol/L   CO2 32 22 - 32 mmol/L   Glucose, Bld 74 65 - 99 mg/dL   BUN 22 (H) 6 - 20 mg/dL   Creatinine, Ser 4.09 0.61 - 1.24 mg/dL   Calcium 9.1 8.9 - 81.1 mg/dL   Total Protein 6.8 6.5 - 8.1 g/dL   Albumin 3.6 3.5 - 5.0 g/dL   AST 21 15 -  41 U/L   ALT 24 17 - 63 U/L   Alkaline Phosphatase 68 38 - 126 U/L   Total Bilirubin 0.6 0.3 - 1.2 mg/dL   GFR calc non Af Amer >60 >60 mL/min   GFR calc Af Amer >60 >60 mL/min   Anion gap 4 (L) 5 - 15  Ethanol  Result Value Ref Range   Alcohol, Ethyl (B) <5 <5 mg/dL  CBC with Diff  Result Value Ref Range   WBC 8.7 4.0 - 10.5 K/uL   RBC 4.53 4.22 - 5.81 MIL/uL   Hemoglobin 13.0 13.0 - 17.0 g/dL   HCT 54.0 98.1 - 19.1 %   MCV 91.6 78.0 - 100.0 fL   MCH 28.7 26.0 - 34.0 pg   MCHC 31.3 30.0 - 36.0 g/dL   RDW 47.8 29.5 - 62.1 %    Platelets 227 150 - 400 K/uL   Neutrophils Relative % 60 %   Neutro Abs 5.2 1.7 - 7.7 K/uL   Lymphocytes Relative 27 %   Lymphs Abs 2.4 0.7 - 4.0 K/uL   Monocytes Relative 9 %   Monocytes Absolute 0.8 0.1 - 1.0 K/uL   Eosinophils Relative 4 %   Eosinophils Absolute 0.3 0.0 - 0.7 K/uL   Basophils Relative 0 %   Basophils Absolute 0.0 0.0 - 0.1 K/uL  Urine rapid drug screen (hosp performed)not at [facility]  Result Value Ref Range   Opiates NONE DETECTED NONE DETECTED   Cocaine NONE DETECTED NONE DETECTED   Benzodiazepines NONE DETECTED NONE DETECTED   Amphetamines NONE DETECTED NONE DETECTED   Tetrahydrocannabinol NONE DETECTED NONE DETECTED   Barbiturates NONE DETECTED NONE DETECTED   No results found.  MDM  TTS and psychiatry to evaluate patient for psychiatric admission. Patient agrees with inpatient  psychiatricstay Final diagnoses:  None   diagnosis:#1auditory hallucinations #2 paranoia      Doug Sou, MD 02/08/15 1705

## 2015-02-08 NOTE — BH Assessment (Signed)
Writer informed TTS Jovea of the consult.  
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
## 2015-02-09 NOTE — BH Assessment (Signed)
[facility] Assessment Progress Note   Amy from [facility] called to say that Dr. Betti Cruz had accepted patient.  Nurse call report to [phone].  Patient will be going to [facility] and can come after 09:00.

## 2015-02-09 NOTE — BH Assessment (Signed)
[facility] Assessment Progress Note  Treatment plan to transfer pt to [facility] was discussed with pt and he is agreeable to transfer.  He agrees to sign consent for admission upon arrival.  Pt's nurse, Kendal Hymen, has been notified.  Pt is under voluntary status and is to be transported via Leota Sauers, [place] Triage Specialist [phone]

## 2019-02-10 ENCOUNTER — Emergency Department
Admission: EM | Admit: 2019-02-10 | Discharge: 2019-02-11 | Disposition: A | Discharge status: Home / Self Care | Payer: Medicare Other | Attending: Student in an Organized Health Care Education/Training Program | Admitting: Student in an Organized Health Care Education/Training Program

## 2019-02-10 ENCOUNTER — Other Ambulatory Visit: Payer: Self-pay

## 2019-02-10 DIAGNOSIS — F203 Undifferentiated schizophrenia: Secondary | ICD-10-CM | POA: Diagnosis not present

## 2019-02-10 DIAGNOSIS — F259 Schizoaffective disorder, unspecified: Secondary | ICD-10-CM | POA: Diagnosis not present

## 2019-02-10 DIAGNOSIS — Z20828 Contact with and (suspected) exposure to other viral communicable diseases: Secondary | ICD-10-CM | POA: Diagnosis not present

## 2019-02-10 DIAGNOSIS — F319 Bipolar disorder, unspecified: Secondary | ICD-10-CM | POA: Diagnosis not present

## 2019-02-10 DIAGNOSIS — Z046 Encounter for general psychiatric examination, requested by authority: Secondary | ICD-10-CM | POA: Diagnosis present

## 2019-02-10 DIAGNOSIS — J45909 Unspecified asthma, uncomplicated: Secondary | ICD-10-CM | POA: Insufficient documentation

## 2019-02-10 DIAGNOSIS — F209 Schizophrenia, unspecified: Secondary | ICD-10-CM | POA: Diagnosis not present

## 2019-02-10 DIAGNOSIS — F22 Delusional disorders: Secondary | ICD-10-CM | POA: Diagnosis present

## 2019-02-10 DIAGNOSIS — Z79899 Other long term (current) drug therapy: Secondary | ICD-10-CM | POA: Insufficient documentation

## 2019-02-10 DIAGNOSIS — R443 Hallucinations, unspecified: Secondary | ICD-10-CM

## 2019-02-10 LAB — COMPREHENSIVE METABOLIC PANEL
ALT: 16 U/L (ref 0–44)
AST: 20 U/L (ref 15–41)
Albumin: 3.6 g/dL (ref 3.5–5.0)
Alkaline Phosphatase: 46 U/L (ref 38–126)
Anion gap: 8 (ref 5–15)
BUN: 20 mg/dL (ref 6–20)
CO2: 28 mmol/L (ref 22–32)
Calcium: 8.8 mg/dL — ABNORMAL LOW (ref 8.9–10.3)
Chloride: 101 mmol/L (ref 98–111)
Creatinine, Ser: 1.18 mg/dL (ref 0.61–1.24)
GFR calc Af Amer: >60 mL/min (ref >60)
GFR calc non Af Amer: >60 mL/min (ref >60)
Glucose, Bld: 111 mg/dL — ABNORMAL HIGH (ref 70–99)
Potassium: 3.7 mmol/L (ref 3.5–5.1)
Sodium: 137 mmol/L (ref 135–145)
Total Bilirubin: 0.8 mg/dL (ref 0.3–1.2)
Total Protein: 7 g/dL (ref 6.5–8.1)

## 2019-02-10 LAB — CBC WITH DIFFERENTIAL/PLATELET
Abs Immature Granulocytes: 0.02 10*3/uL (ref 0.00–0.07)
Basophils Absolute: 0 10*3/uL (ref 0.0–0.1)
Basophils Relative: 0 %
Eosinophils Absolute: 0.1 10*3/uL (ref 0.0–0.5)
Eosinophils Relative: 1 %
HCT: 46.7 % (ref 39.0–52.0)
Hemoglobin: 14.6 g/dL (ref 13.0–17.0)
Immature Granulocytes: 0 %
Lymphocytes Relative: 28 %
Lymphs Abs: 1.8 10*3/uL (ref 0.7–4.0)
MCH: 28.3 pg (ref 26.0–34.0)
MCHC: 31.3 g/dL (ref 30.0–36.0)
MCV: 90.5 fL (ref 80.0–100.0)
Monocytes Absolute: 0.4 10*3/uL (ref 0.1–1.0)
Monocytes Relative: 6 %
Neutro Abs: 4.2 10*3/uL (ref 1.7–7.7)
Neutrophils Relative %: 65 %
Platelets: 199 10*3/uL (ref 150–400)
RBC: 5.16 MIL/uL (ref 4.22–5.81)
RDW: 13.6 % (ref 11.5–15.5)
WBC: 6.5 10*3/uL (ref 4.0–10.5)
nRBC: 0 % (ref 0.0–0.2)

## 2019-02-10 LAB — VALPROIC ACID LEVEL: Valproic Acid Lvl: 82 ug/mL (ref 50.0–100.0)

## 2019-02-10 LAB — ACETAMINOPHEN LEVEL: Acetaminophen (Tylenol), Serum: <10 ug/mL — ABNORMAL LOW (ref 10–30)

## 2019-02-10 LAB — ETHANOL: Alcohol, Ethyl (B): <10 mg/dL (ref <10)

## 2019-02-10 NOTE — ED Triage Notes (Signed)
First Nurse Note:  Arrives with [employer] for voluntary ED evaluation.  Patient states he is hearing voices that are telling patient that "others want to hurt him".  Denies SI/ HI.  Patient is calm and cooperative at this time.

## 2019-02-10 NOTE — ED Notes (Signed)
Hourly rounding reveals patient in hall bed. No complaints, stable, in no acute distress. Q15 minute rounds and monitoring via Rover and Officer to continue.  

## 2019-02-10 NOTE — ED Notes (Signed)
Pt dressed out into burgundy scrubs with this tech and [employer] Officer D[place] in the rm. Pt belongings consist of a gray toboggan, a orange sweater, a blue hoodie, black tennis shoes, blue sweat pants, a gray t-shirt, a black t-shirt, blue socks and stripped boxers. Pt belongings placed into two pt belongings bags and labeled with pt name. Pt denies having any money or a wallet. Pt calm and cooperative while dressing out.

## 2019-02-10 NOTE — ED Notes (Signed)
Patient talking to TTS in consult room.

## 2019-02-10 NOTE — ED Notes (Addendum)
Pt states he is here because of his mental health. Pt reports he sees things and always feels that people are out to get him.  Pt also states he has a "bad back."  Maintained on 15 minute checks and observation by security camera for safety.

## 2019-02-10 NOTE — ED Notes (Signed)
Report to include Situation, Background, Assessment, and Recommendations received from Amy B. RN. Patient alert and oriented, warm and dry, in no acute distress. Patient denies SI, HI, VH and pain. Patient states he has AH with out command. Patient made aware of Q15 minute rounds and Engineer, drilling presence for their safety. Patient instructed to come to me with needs or concerns.

## 2019-02-10 NOTE — ED Provider Notes (Signed)
[facility] Emergency Department Provider Note    First MD Initiated Contact with Patient 02/10/19 1839     (approximate)  I have reviewed the triage vital signs and the nursing notes.   HISTORY  Chief Complaint Voluntary    HPI Eddie Holland is a 58 y.o. male below listed past medical history presents the ER for "mental health issues ".  States he is having hallucinations and hearing voices.  Denies any SI or HI.  Want to speak to psychiatry about medication changes.    Past Medical History:  Diagnosis Date  . Asthma   . Back pain   . Paranoia (HCC)    No family history on file.  There are no active problems to display for this patient.     Prior to Admission medications   Not on File    Allergies Depakote [valproic acid] and Seroquel [quetiapine fumarate]    Social History Social History   Tobacco Use  . Smoking status: Never Smoker  Substance Use Topics  . Alcohol use: No  . Drug use: No    Review of Systems Patient denies headaches, rhinorrhea, blurry vision, numbness, shortness of breath, chest pain, edema, cough, abdominal pain, nausea, vomiting, diarrhea, dysuria, fevers, rashes or hallucinations unless otherwise stated above in HPI. ____________________________________________   PHYSICAL EXAM:  VITAL SIGNS: Vitals:   02/10/19 1613  BP: 118/78  Pulse: 97  Resp: 20  Temp: 98.4 F (36.9 C)  SpO2: 94%    Constitutional: Alert and oriented.  Eyes: Conjunctivae are normal.  Head: Atraumatic. Nose: No congestion/rhinnorhea. Mouth/Throat: Mucous membranes are moist.   Neck: No stridor. Painless ROM.  Cardiovascular: Normal rate, regular rhythm. Grossly normal heart sounds.  Good peripheral circulation. Respiratory: Normal respiratory effort.  No retractions. Lungs CTAB. Gastrointestinal: Soft and nontender. No distention. No abdominal bruits. No CVA tenderness. Genitourinary:  Musculoskeletal: No lower  extremity tenderness nor edema.  No joint effusions. Neurologic:  Normal speech and language. No gross focal neurologic deficits are appreciated. No facial droop Skin:  Skin is warm, dry and intact. No rash noted. Psychiatric: odd affect, calm and cooperative  ____________________________________________   LABS (all labs ordered are listed, but only abnormal results are displayed)  Results for orders placed or performed during the hospital encounter of 02/10/19 (from the past 24 hour(s))  CBC with Differential     Status: None   Collection Time: 02/10/19  4:33 PM  Result Value Ref Range   WBC 6.5 4.0 - 10.5 K/uL   RBC 5.16 4.22 - 5.81 MIL/uL   Hemoglobin 14.6 13.0 - 17.0 g/dL   HCT 93.8 18.2 - 99.3 %   MCV 90.5 80.0 - 100.0 fL   MCH 28.3 26.0 - 34.0 pg   MCHC 31.3 30.0 - 36.0 g/dL   RDW 71.6 96.7 - 89.3 %   Platelets 199 150 - 400 K/uL   nRBC 0.0 0.0 - 0.2 %   Neutrophils Relative % 65 %   Neutro Abs 4.2 1.7 - 7.7 K/uL   Lymphocytes Relative 28 %   Lymphs Abs 1.8 0.7 - 4.0 K/uL   Monocytes Relative 6 %   Monocytes Absolute 0.4 0.1 - 1.0 K/uL   Eosinophils Relative 1 %   Eosinophils Absolute 0.1 0.0 - 0.5 K/uL   Basophils Relative 0 %   Basophils Absolute 0.0 0.0 - 0.1 K/uL   Immature Granulocytes 0 %   Abs Immature Granulocytes 0.02 0.00 - 0.07 K/uL  Comprehensive metabolic panel  Status: Abnormal   Collection Time: 02/10/19  4:33 PM  Result Value Ref Range   Sodium 137 135 - 145 mmol/L   Potassium 3.7 3.5 - 5.1 mmol/L   Chloride 101 98 - 111 mmol/L   CO2 28 22 - 32 mmol/L   Glucose, Bld 111 (H) 70 - 99 mg/dL   BUN 20 6 - 20 mg/dL   Creatinine, Ser 1.18 0.61 - 1.24 mg/dL   Calcium 8.8 (L) 8.9 - 10.3 mg/dL   Total Protein 7.0 6.5 - 8.1 g/dL   Albumin 3.6 3.5 - 5.0 g/dL   AST 20 15 - 41 U/L   ALT 16 0 - 44 U/L   Alkaline Phosphatase 46 38 - 126 U/L   Total Bilirubin 0.8 0.3 - 1.2 mg/dL   GFR calc non Af Amer >60 >60 mL/min   GFR calc Af Amer >60 >60 mL/min    Anion gap 8 5 - 15  Ethanol     Status: None   Collection Time: 02/10/19  4:33 PM  Result Value Ref Range   Alcohol, Ethyl (B) <10 <10 mg/dL  Acetaminophen level     Status: Abnormal   Collection Time: 02/10/19  4:33 PM  Result Value Ref Range   Acetaminophen (Tylenol), Serum <10 (L) 10 - 30 ug/mL   ____________________________________________ ____________________________________________  RADIOLOGY   ____________________________________________   PROCEDURES  Procedure(s) performed:  Procedures    Critical Care performed: no ____________________________________________   INITIAL IMPRESSION / ASSESSMENT AND PLAN / ED COURSE  Pertinent labs & imaging results that were available during my care of the patient were reviewed by me and considered in my medical decision making (see chart for details).   DDX: Psychosis, delirium, medication effect, noncompliance, polysubstance abuse, Si, Hi, depression   Eddie Holland is a 58 y.o. who presents to the ED for evaluation of mental health issues.  Patient has psych history.  Laboratory testing was ordered to evaluation for underlying electrolyte derangement or signs of underlying organic pathology to explain today's presentation.  Based on history and physical and laboratory evaluation, it appears that the patient's presentation is 2/2 underlying psychiatric disorder but also likely exacerbated by current living situation as he is reportedly unhappy with his group home.  Will consult psychiatry.  Patient voluntary.  The patient was evaluated in Emergency Department today for the symptoms described in the history of present illness. He/she was evaluated in the context of the global COVID-19 pandemic, which necessitated consideration that the patient might be at risk for infection with the SARS-CoV-2 virus that causes COVID-19. Institutional protocols and algorithms that pertain to the evaluation of patients at risk for COVID-19 are in a  state of rapid change based on information released by regulatory bodies including the CDC and federal and state organizations. These policies and algorithms were followed during the patient's care in the ED.  As part of my medical decision making, I reviewed the following data within the [facility] notes reviewed and incorporated, Labs reviewed, notes from prior ED visits and [place] Controlled Substance Database   ____________________________________________   FINAL CLINICAL IMPRESSION(S) / ED DIAGNOSES  Final diagnoses:  Hallucinations      NEW MEDICATIONS STARTED DURING THIS VISIT:  New Prescriptions   No medications on file     Note:  This document was prepared using Dragon voice recognition software and may include unintentional dictation errors.    Merlyn Lot, MD 02/10/19 [phone]

## 2019-02-10 NOTE — Consult Note (Signed)
[facility] Face-to-Face Psychiatry Consult   Reason for Consult: Auditory hallucinations Referring Physician: Dr. Roxan Hockey Patient Identification: Eddie Holland MRN:  [id number] Principal Diagnosis: Paranoia [facility]) Diagnosis:  Principal Problem:   Paranoia (HCC) Active Problems:   Schizophrenia (HCC)   Bipolar 1 disorder (HCC)   Total Time spent with patient: 1 hour  Subjective: "I am hearing voices.  It was telling me people are coming after me." Eddie Holland is a 58 y.o. male patient presented to [facility] ED via law enforcement voluntary.  Per the ED triage nursing note, the patient presented by voicing, he hears voices telling him that others want to hurt him.  He denies suicidal or homicidal ideation and communicates that he does not have a history of any suicidal attempt.    The patient was seen face-to-face by this provider; the chart reviewed and consulted with Dr. Roxan Hockey on 02/10/2019 due to the patient's care. It was discussed with the EDP that the patient would be observed overnight and reassess in the a.m. to determine if he meets the criteria for psychiatric inpatient admission or discharge to a shelter/home. The patient discloses to this provider that he is homeless and has nowhere to go.  He voiced he came to the ED to get his medication "right."  He declared he was on Depakote during the patient's further assessment; he did not like how it made him feel.  He was unable to provide this provider with the amount of medication he was taking.  He disclosed, "I used to take Haldol injection along with the pills.  I do not know how much I was taking."  He states he was at [facility] for a month, and that was two months ago he was discharged to a group home.  He voiced, "I left the group home because I did not like the type of people that was coming there."  He states it was "all the young boy's coming in and out, and I did not feel safe."  The patient voice leaving the group home and going to a  shelter was demanding for him. He explained he has been in the shelter for a couple of weeks.  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  He voiced he has chronic back pain, and that is why he is on disability. The patient is alert and oriented x4, calm and cooperative, and mood-congruent with an affect on evaluation. The patient does not appear to be responding to internal or external stimuli. Neither is the patient presenting with any delusional thinking. The patient admits to auditory hallucinations but denies visual hallucinations.  "No, I do not see things.  It is nothing like that.  I hear voices telling me people are after me to hurt me."  The patient denies any suicidal, homicidal, or self-harm ideations. The patient is not presenting with any psychotic or paranoid behaviors.  He admits to being paranoid.  During an encounter with the patient, he was able to answer questions appropriately.  This provider did not obtain collateral. The patient refused to provide this provider with any family contact information.  The patient is voicing, "I do not trust my family, and I do not want them in my business.  He states, "they are the reason I am in this situation." Attempts were made to contact the information on his demographics, but the number is not in service. Plan: The patient  will be observed overnight and reassess in the a.m. to determine if he meets the criteria for psychiatric inpatient admission or to be discharged to a shelter/home.  HPI: Per Dr. Roxan Hockeyobinson; Eddie Holland is a 58 y.o. male below listed past medical history presents the ER for "mental health issues ".  States he is having hallucinations and hearing voices.  Denies any SI or HI.  Want to speak to psychiatry about medication changes.  Past Psychiatric History:  Paranoia (HCC) Schizophrenia (HCC) Bipolar (HCC)  Risk to Self: Suicidal Ideation:  No Suicidal Intent: No Is patient at risk for suicide?: No Suicidal Plan?: No Access to Means: No What has been your use of drugs/alcohol within the last 12 months?: Denied the use of drugs or alcohol How many times?: 0 Other Self Harm Risks: denied Triggers for Past Attempts: None known Intentional Self Injurious Behavior: None Risk to Others: Homicidal Ideation: No Thoughts of Harm to Others: No Current Homicidal Intent: No Current Homicidal Plan: No Access to Homicidal Means: No Identified Victim: None identified History of harm to others?: No Assessment of Violence: None Noted Violent Behavior Description: Denied by patient Does patient have access to weapons?: No Criminal Charges Pending?: No Does patient have a court date: No Prior Inpatient Therapy: Prior Inpatient Therapy: Yes Prior Therapy Dates: 2020 [facility] Reason for Treatment: Schizophrenia, Bipolar Disorder Prior Outpatient Therapy: Prior Outpatient Therapy: No Does patient have an ACCT team?: No Does patient have Intensive In-House Services?  : No Does patient have [facility] services? : No Does patient have P4CC services?: No  Past Medical History:  Past Medical History:  Diagnosis Date  . Asthma   . Back pain   . Paranoia (HCC)     Family History: No family history on file. Family Psychiatric  History: No pertinent family psychiatric history Social History:  Social History   Substance and Sexual Activity  Alcohol Use No     Social History   Substance and Sexual Activity  Drug Use No    Social History   Socioeconomic History  . Marital status: Married    Spouse name: Not on file  . Number of children: Not on file  . Years of education: Not on file  . Highest education level: Not on file  Occupational History  . Not on file  Social Needs  . Financial resource strain: Not on file  . Food insecurity    Worry: Not on file    Inability: Not on file   . Transportation needs    Medical: Not on file    Non-medical: Not on file  Tobacco Use  . Smoking status: Never Smoker  Substance and Sexual Activity  . Alcohol use: No  . Drug use: No  . Sexual activity: Not on file  Lifestyle  . Physical activity    Days per week: Not on file    Minutes per session: Not on file  . Stress: Not on file  Relationships  . Social Musicianconnections    Talks on phone: Not on file    Gets together: Not on file    Attends religious service: Not on file    Active member of club or organization: Not on file    Attends meetings of clubs or organizations: Not on file    Relationship status: Not on file  Other Topics Concern  . Not on file  Social History Narrative  . Not on file   Additional Social History:  Allergies:   Allergies  Allergen Reactions  . Depakote [Valproic Acid] Other (See Comments)    Unknown   . Seroquel [Quetiapine Fumarate] Other (See Comments)    Back pain     Labs:  Results for orders placed or performed during the hospital encounter of 02/10/19 (from the past 48 hour(s))  CBC with Differential     Status: None   Collection Time: 02/10/19  4:33 PM  Result Value Ref Range   WBC 6.5 4.0 - 10.5 K/uL   RBC 5.16 4.22 - 5.81 MIL/uL   Hemoglobin 14.6 13.0 - 17.0 g/dL   HCT 16.1 09.6 - 04.5 %   MCV 90.5 80.0 - 100.0 fL   MCH 28.3 26.0 - 34.0 pg   MCHC 31.3 30.0 - 36.0 g/dL   RDW 40.9 81.1 - 91.4 %   Platelets 199 150 - 400 K/uL   nRBC 0.0 0.0 - 0.2 %   Neutrophils Relative % 65 %   Neutro Abs 4.2 1.7 - 7.7 K/uL   Lymphocytes Relative 28 %   Lymphs Abs 1.8 0.7 - 4.0 K/uL   Monocytes Relative 6 %   Monocytes Absolute 0.4 0.1 - 1.0 K/uL   Eosinophils Relative 1 %   Eosinophils Absolute 0.1 0.0 - 0.5 K/uL   Basophils Relative 0 %   Basophils Absolute 0.0 0.0 - 0.1 K/uL   Immature Granulocytes 0 %   Abs Immature Granulocytes 0.02 0.00 - 0.07 K/uL    Comment: Performed at [facility], [address]  [place], [place] [address]  Comprehensive metabolic panel     Status: Abnormal   Collection Time: 02/10/19  4:33 PM  Result Value Ref Range   Sodium 137 135 - 145 mmol/L   Potassium 3.7 3.5 - 5.1 mmol/L   Chloride 101 98 - 111 mmol/L   CO2 28 22 - 32 mmol/L   Glucose, Bld 111 (H) 70 - 99 mg/dL   BUN 20 6 - 20 mg/dL   Creatinine, Ser 6.21 0.61 - 1.24 mg/dL   Calcium 8.8 (L) 8.9 - 10.3 mg/dL   Total Protein 7.0 6.5 - 8.1 g/dL   Albumin 3.6 3.5 - 5.0 g/dL   AST 20 15 - 41 U/L   ALT 16 0 - 44 U/L   Alkaline Phosphatase 46 38 - 126 U/L   Total Bilirubin 0.8 0.3 - 1.2 mg/dL   GFR calc non Af Amer >60 >60 mL/min   GFR calc Af Amer >60 >60 mL/min   Anion gap 8 5 - 15    Comment: Performed at [facility], [address] [place], [place] [address]  Ethanol     Status: None   Collection Time: 02/10/19  4:33 PM  Result Value Ref Range   Alcohol, Ethyl (B) <10 <10 mg/dL    Comment: (NOTE) Lowest detectable limit for serum alcohol is 10 mg/dL. For medical purposes only. Performed at [facility], [address] [place], [place] [address]   Acetaminophen level     Status: Abnormal   Collection Time: 02/10/19  4:33 PM  Result Value Ref Range   Acetaminophen (Tylenol), Serum <10 (L) 10 - 30 ug/mL    Comment: (NOTE) Therapeutic concentrations vary significantly. A range of 10-30 ug/mL  may be an effective concentration for many patients. However, some  are best treated at concentrations outside of this range. Acetaminophen concentrations >150 ug/mL at 4 hours after ingestion  and >50 ug/mL at 12 hours after ingestion are  often associated with  toxic reactions. Performed at [facility], [address] [place], [place] [address]     No current facility-administered medications for this encounter.    No current outpatient medications on file.    Musculoskeletal: Strength & Muscle Tone: within normal limits Gait & Station: normal Patient leans:  N/A  Psychiatric Specialty Exam: Physical Exam  Nursing note and vitals reviewed. Constitutional: He is oriented to person, place, and time.  Neck: Normal range of motion. Neck supple.  Cardiovascular: Normal rate.  Respiratory: Effort normal.  Musculoskeletal: Normal range of motion.  Neurological: He is alert and oriented to person, place, and time.  Psychiatric: His behavior is normal.    Review of Systems  Psychiatric/Behavioral: Positive for hallucinations. The patient is nervous/anxious.   All other systems reviewed and are negative.   Blood pressure 118/78, pulse 97, temperature 98.4 F (36.9 C), temperature source Oral, resp. rate 20, height 6' (1.829 m), weight (!) 143.8 kg, SpO2 94 %.Body mass index is 42.99 kg/m.  General Appearance: Casual and Guarded  Eye Contact:  Good  Speech:  Clear and Coherent  Volume:  Normal  Mood:  Anxious and Depressed  Affect:  Congruent, Depressed and Flat  Thought Process:  Coherent  Orientation:  Full (Time, Place, and Person)  Thought Content:  Hallucinations: Auditory  Suicidal Thoughts:  No  Homicidal Thoughts:  No  Memory:  Immediate;   Good Recent;   Good Remote;   Fair  Judgement:  Intact  Insight:  Fair  Psychomotor Activity:  Normal  Concentration:  Concentration: Good and Attention Span: Good  Recall:  Good  Fund of Knowledge:  Good  Language:  Good  Akathisia:  Negative  Handed:  Right  AIMS (if indicated):     Assets:  Communication Skills Desire for Improvement Housing Physical Health Social Support  ADL's:  Intact  Cognition:  WNL  Sleep:   Good     Treatment Plan Summary: Daily contact with patient to assess and evaluate symptoms and progress in treatment and Plan The patient will be observed overnight and reassess in the a.m. to determine if he meets criteria for psychiatric inpatient admission or discharge to a shelter.  Disposition: Supportive therapy provided about ongoing stressors. The patient  will be observed overnight and reassess in the a.m. to determine if he meets criteria for psychiatric inpatient admission or discharge to a shelter.  Caroline Sauger, NP 02/10/2019 10:34 PM

## 2019-02-10 NOTE — BH Assessment (Signed)
Assessment Note  Eddie Holland is an 58 y.o. male. Eddie Holland arrived to the ED by way of law enforcement.  He reports, "My medicine needs to be adjusted. My Depakote needs to be checked.  It makes me hear more voices, it makes me sicker than what I am.  I asked them to decrease it, and they increased it.  I was out on the streets, because I did not have no where else to go. They took me to another place, but they did not want to help." He reports that he is feeling "down and out".  "I feel like I been used and abused by the group homes and my own people who used me for my money and then put me in a hotel and would not give me money." He reports that he depressed.  He reports that he has been having increased anxiety.  He reports having auditory hallucinations of hearing that people are trying to kill me.  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  He denied having homicidal or suicidal ideation or intent.  He denied the use of alcohol or drugs.    Diagnosis: Schizophrenia  Past Medical History:  Past Medical History:  Diagnosis Date  . Asthma   . Back pain   . Paranoia (HCC)       Family History: No family history on file.  Social History:  reports that he has never smoked. He does not have any smokeless tobacco history on file. He reports that he does not drink alcohol or use drugs.  Additional Social History:  Alcohol / Drug Use History of alcohol / drug use?: No history of alcohol / drug abuse  CIWA: CIWA-Ar BP: 118/78 Pulse Rate: 97 COWS:    Allergies:  Allergies  Allergen Reactions  . Depakote [Valproic Acid] Other (See Comments)    Unknown   . Seroquel [Quetiapine Fumarate] Other (See Comments)    Back pain     Home Medications: (Not in a hospital admission)   OB/GYN Status:  No LMP for male patient.  General Assessment Data Location of Assessment: [facility] ED TTS Assessment: In system Is this a Tele or Face-to-Face Assessment?:  Face-to-Face Is this an Initial Assessment or a Re-assessment for this encounter?: Initial Assessment Patient Accompanied by:: N/A Language Other than English: No Living Arrangements: Homeless/Shelter What gender do you identify as?: Male Marital status: Widowed Living Arrangements: Other (Comment)(homeless) Can pt return to current living arrangement?: Yes Admission Status: Voluntary Is patient capable of signing voluntary admission?: Yes Referral Source: Self/Family/Friend Insurance type: Medicare  Medical Screening Exam [facility] Walk-in ONLY) Medical Exam completed: Yes  Crisis Care Plan Living Arrangements: Other (Comment)(homeless) Legal Guardian: Other:(Self) Name of Psychiatrist: None Name of Therapist: None  Education Status Is patient currently in school?: No Is the patient employed, unemployed or receiving disability?: Receiving disability income  Risk to self with the past 6 months Suicidal Ideation: No Has patient been a risk to self within the past 6 months prior to admission? : No Suicidal Intent: No Has patient had any suicidal intent within the past 6 months prior to admission? : No Is patient at risk for suicide?: No Suicidal Plan?: No Has patient had any suicidal plan within the past 6 months prior to admission? : No Access to Means: No What has been your use of drugs/alcohol within the last 12 months?: Denied the use of drugs or alcohol Previous Attempts/Gestures: No How  many times?: 0 Other Self Harm Risks: denied Triggers for Past Attempts: None known Intentional Self Injurious Behavior: None Family Suicide History: No Recent stressful life event(s): Other (Comment)(homeless) Persecutory voices/beliefs?: Yes Depression: Yes Depression Symptoms: Feeling worthless/self pity Substance abuse history and/or treatment for substance abuse?: No Suicide prevention information given to non-admitted patients: Not applicable  Risk to Others within the past 6  months Homicidal Ideation: No Does patient have any lifetime risk of violence toward others beyond the six months prior to admission? : No Thoughts of Harm to Others: No Current Homicidal Intent: No Current Homicidal Plan: No Access to Homicidal Means: No Identified Victim: None identified History of harm to others?: No Assessment of Violence: None Noted Violent Behavior Description: Denied by patient Does patient have access to weapons?: No Criminal Charges Pending?: No Does patient have a court date: No Is patient on probation?: No  Psychosis Hallucinations: Auditory Delusions: None noted  Mental Status Report Appearance/Hygiene: In scrubs Eye Contact: Good Motor Activity: Unremarkable Speech: Logical/coherent Level of Consciousness: Alert Mood: Depressed Affect: Appropriate to circumstance Anxiety Level: None Thought Processes: Coherent Judgement: Partial Orientation: Appropriate for developmental age Obsessive Compulsive Thoughts/Behaviors: None  Cognitive Functioning Concentration: Fair Memory: Recent Intact Is patient IDD: No Insight: Fair Impulse Control: Fair Appetite: Good Have you had any weight changes? : No Change Sleep: Decreased Vegetative Symptoms: None  ADLScreening [facility] Assessment Services) Patient's cognitive ability adequate to safely complete daily activities?: Yes Patient able to express need for assistance with ADLs?: Yes Independently performs ADLs?: Yes (appropriate for developmental age)  Prior Inpatient Therapy Prior Inpatient Therapy: Yes Prior Therapy Dates: 2020 Prior Therapy Facilty/Provider(s): [facility] Reason for Treatment: Schizophrenia, Bipolar Disorder  Prior Outpatient Therapy Prior Outpatient Therapy: No Does patient have an ACCT team?: No Does patient have Intensive In-House Services?  : No Does patient have [facility] services? : No Does patient have P4CC services?: No  ADL Screening (condition at time of  admission) Patient's cognitive ability adequate to safely complete daily activities?: Yes Is the patient deaf or have difficulty hearing?: No Does the patient have difficulty seeing, even when wearing glasses/contacts?: No Does the patient have difficulty concentrating, remembering, or making decisions?: No Patient able to express need for assistance with ADLs?: Yes Does the patient have difficulty dressing or bathing?: No Independently performs ADLs?: Yes (appropriate for developmental age) Does the patient have difficulty walking or climbing stairs?: Yes Weakness of Legs: Both(has a balloon and stint in right leg, blood clots in both legs.) Weakness of Arms/Hands: None  Home Assistive Devices/Equipment Home Assistive Devices/Equipment: None    Abuse/Neglect Assessment (Assessment to be complete while patient is alone) Abuse/Neglect Assessment Can Be Completed: (Denied a history of abuse)                Disposition:  Disposition Initial Assessment Completed for this Encounter: Yes  On Site Evaluation by:   Reviewed with Physician:    Elmer Bales 02/10/2019 9:07 PM

## 2019-02-10 NOTE — ED Notes (Signed)
Hourly rounding reveals patient in consult room talking to TTS. No complaints, stable, in no acute distress. Q15 minute rounds and monitoring via Rover and Officer to continue.  

## 2019-02-11 LAB — URINALYSIS, COMPLETE (UACMP) WITH MICROSCOPIC
Bacteria, UA: NONE SEEN
Bilirubin Urine: NEGATIVE
Glucose, UA: NEGATIVE mg/dL
Hgb urine dipstick: NEGATIVE
Ketones, ur: NEGATIVE mg/dL
Leukocytes,Ua: NEGATIVE
Nitrite: NEGATIVE
Protein, ur: NEGATIVE mg/dL
Specific Gravity, Urine: 1.018 (ref 1.005–1.030)
pH: 7 (ref 5.0–8.0)

## 2019-02-11 LAB — URINE DRUG SCREEN, QUALITATIVE (ARMC ONLY)
Amphetamines, Ur Screen: NOT DETECTED
Barbiturates, Ur Screen: NOT DETECTED
Benzodiazepine, Ur Scrn: NOT DETECTED
Cannabinoid 50 Ng, Ur ~~LOC~~: NOT DETECTED
Cocaine Metabolite,Ur ~~LOC~~: NOT DETECTED
MDMA (Ecstasy)Ur Screen: NOT DETECTED
Methadone Scn, Ur: NOT DETECTED
Opiate, Ur Screen: NOT DETECTED
Phencyclidine (PCP) Ur S: NOT DETECTED
Tricyclic, Ur Screen: NOT DETECTED

## 2019-02-11 LAB — SARS CORONAVIRUS 2 BY RT PCR (HOSPITAL ORDER, PERFORMED IN ~~LOC~~ HOSPITAL LAB): SARS Coronavirus 2: NEGATIVE

## 2019-02-11 NOTE — ED Provider Notes (Signed)
-----------------------------------------   1:24 PM on 02/11/2019 -----------------------------------------  Psychiatry team recommends discharge.  The patient is stable for discharge at this time.  Return precautions provided.   Arta Silence, MD 02/11/19 1324

## 2019-02-11 NOTE — ED Notes (Signed)
Pt discharged home with bus pass. All belongings returned to patient. VS stable. Discharge instructions reviewed with patient.  Pt signed for discharge. Pt denies SI/HI.

## 2019-02-11 NOTE — ED Provider Notes (Signed)
-----------------------------------------   6:12 AM on 02/11/2019 -----------------------------------------   Blood pressure 118/78, pulse 97, temperature 98.4 F (36.9 C), temperature source Oral, resp. rate 20, height 6' (1.829 m), weight (!) 143.8 kg, SpO2 94 %.  The patient is calm and cooperative at this time.  There have been no acute events since the last update.  Awaiting disposition plan from Behavioral Medicine and/or Social Work team(s).   Paulette Blanch, MD 02/11/19 [phone]

## 2019-02-11 NOTE — ED Notes (Signed)

## 2019-02-11 NOTE — ED Notes (Signed)
Hourly rounding reveals patient in hall bed. No complaints, stable, in no acute distress. Q15 minute rounds and monitoring via Rover and Officer to continue.  

## 2019-02-11 NOTE — Social Work (Addendum)
CSW was informed that [facility] was considering this patient by their previous group home, [facility].  CSW contacted Otila Kluver at [employer] who shared that she asked for information on this patient, but this was not provided. Otila Kluver reported that she will look at him now, and see if patient has Medicaid. CSW sent initial referral to [facility] over the [place].   UPDATE: [facility] will not be able to accept patient. Patient does not have Medicaid.     [place], [place] ED  [phone]

## 2019-02-11 NOTE — ED Notes (Signed)
Pt. To BHU from ED ambulatory without difficulty, to room 4. Report from [employer] Pt. Is alert and oriented, warm and dry in no distress. Pt. Denies SI, HI, and AVH. Pt. Calm and cooperative. Pt. Made aware of security cameras and Q15 minute rounds. Pt. Encouraged to let Nursing staff know of any concerns or needs.

## 2019-02-11 NOTE — Social Work (Signed)
Patient was provided with PART bus pass and route information to get to [facility].     [place], [place] ED  [phone]

## 2019-02-11 NOTE — ED Notes (Signed)
Pt given meal tray.

## 2019-02-11 NOTE — Consult Note (Signed)
[place] Psychiatry Consult   Reason for Consult: Patient reports hearing voices. Referring Physician: Dr Quentin Cornwall Patient Identification: Eddie Holland MRN:  [id number] Principal Diagnosis: Paranoia [facility]) Diagnosis:  Principal Problem:   Paranoia ([place]) Active Problems:   Schizophrenia ([place])   Bipolar 1 disorder ([place])   Total Time spent with patient: 45 minutes  Subjective:   Eddie Holland is a 58 y.o. male patient who presented with complaints of hearing voices.  HPI:   Collateral information was obtained for this patient by [place] services who report that patient presented twice to them today with hopes to obtain psychiatric admission.  Patient was told on both occasions that he did not meet criteria.  Patient then threatened to go to the emergency room and states that he was hearing voices in order to gain admission.   Patient was reassessed on 02/11/2019: Patient presented clear this morning without psychiatric symptoms. Patient denied depression or SI. Denied symptoms of psychosis including mania, hallucinations, or paranoia. Patient states that he is here to get help with a bus pass. States that he would like to return to [place] where is disability is active. Patient states that the hospital should provide a bus ticket because he has spent enough time in [place] and its not helping him. Patient agrees with Probation officer that he is not a candidate for psychiatric admission at this time, but is Mining engineer could provide social work services. Social work contacted, bus pass obtained for patient. Patient cleared for discharge.   Patient was evaluated last night by Caroline Sauger nurse practitioner note as follows:   Subjective: "I am hearing voices.  It was telling me people are coming after me." Eddie Holland is a 58 y.o. male patient presented to [facility] ED via law enforcement voluntary.  Per the ED triage nursing note, the patient presented by voicing, he hears  voices telling him that others want to hurt him.  He denies suicidal or homicidal ideation and communicates that he does not have a history of any suicidal attempt.    The patient was seen face-to-face by this provider; the chart reviewed and consulted with Dr. Quentin Cornwall on 02/10/2019 due to the patient's care. It was discussed with the EDP that the patient would be observed overnight and reassess in the a.m. to determine if he meets the criteria for psychiatric inpatient admission or discharge to a shelter/home. The patient discloses to this provider that he is homeless and has nowhere to go.  He voiced he came to the ED to get his medication "right."  He declared he was on Depakote during the patient's further assessment; he did not like how it made him feel.  He was unable to provide this provider with the amount of medication he was taking.  He disclosed, "I used to take Haldol injection along with the pills.  I do not know how much I was taking."  He states he was at [facility] for a month, and that was two months ago he was discharged to a group home.  He voiced, "I left the group home because I did not like the type of people that was coming there."  He states it was "all the young boy's coming in and out, and I did not feel safe."  The patient voice leaving the group home and going to a shelter was demanding for him. He explained he has been in the shelter for a couple of weeks.  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  He voiced he has chronic back pain, and that is why he is on disability. The patient is alert and oriented x4, calm and cooperative, and mood-congruent with an affect on evaluation. The patient does not appear to be responding to internal or external stimuli. Neither is the patient presenting with any delusional thinking. The patient admits to auditory hallucinations but denies visual hallucinations.   "No, I do not see things.  It is nothing like that.  I hear voices telling me people are after me to hurt me."  The patient denies any suicidal, homicidal, or self-harm ideations. The patient is not presenting with any psychotic or paranoid behaviors.  He admits to being paranoid.  During an encounter with the patient, he was able to answer questions appropriately.  This provider did not obtain collateral. The patient refused to provide this provider with any family contact information.  The patient is voicing, "I do not trust my family, and I do not want them in my business.  He states, "they are the reason I am in this situation." Attempts were made to contact the information on his demographics, but the number is not in service. Plan: The patient will be observed overnight and reassess in the a.m. to determine if he meets the criteria for psychiatric inpatient admission or to be discharged to a shelter/home.  HPI: Per Dr. Roxan Hockey; Eddie Holland a 58 y.o.malebelow listed past medical history presents the ER for "mental health issues ". States he is having hallucinations and hearing voices. Denies any SI or HI. Want to speak to psychiatry about medication changes.  Past Psychiatric History:  Paranoia (HCC) Schizophrenia (HCC) Bipolar (HCC)  Risk to Self: Suicidal Ideation: No Suicidal Intent: No Is patient at risk for suicide?: No Suicidal Plan?: No Access to Means: No What has been your use of drugs/alcohol within the last 12 months?: Denied the use of drugs or alcohol How many times?: 0 Other Self Harm Risks: denied Triggers for Past Attempts: None known Intentional Self Injurious Behavior: None Risk to Others: Homicidal Ideation: No Thoughts of Harm to Others: No Current Homicidal Intent: No Current Homicidal Plan: No Access to Homicidal Means: No Identified Victim: None identified History of harm to others?: No Assessment of Violence: None Noted Violent Behavior  Description: Denied by patient Does patient have access to weapons?: No Criminal Charges Pending?: No Does patient have a court date: No Prior Inpatient Therapy: Prior Inpatient Therapy: Yes Prior Therapy Dates: 2020 Prior Therapy Facilty/Provider(s): [facility] Reason for Treatment: Schizophrenia, Bipolar Disorder Prior Outpatient Therapy: Prior Outpatient Therapy: No Does patient have an ACCT team?: No Does patient have Intensive In-House Services?  : No Does patient have [facility] services? : No Does patient have P4CC services?: No  Past Medical History:      Past Medical History:  Diagnosis Date  . Asthma   . Back pain   . Paranoia (HCC)     Family History: No family history on file. Family Psychiatric  History: No pertinent family psychiatric history Social History:  Social History      Substance and Sexual Activity  Alcohol Use No     Social History      Substance and Sexual Activity  Drug Use No    Social History        Socioeconomic History  . Marital status: Married    Spouse name: Not  on file  . Number of children: Not on file  . Years of education: Not on file  . Highest education level: Not on file  Occupational History  . Not on file  Social Needs  . Financial resource strain: Not on file  . Food insecurity    Worry: Not on file    Inability: Not on file  . Transportation needs    Medical: Not on file    Non-medical: Not on file  Tobacco Use  . Smoking status: Never Smoker  Substance and Sexual Activity  . Alcohol use: No  . Drug use: No  . Sexual activity: Not on file  Lifestyle  . Physical activity    Days per week: Not on file    Minutes per session: Not on file  . Stress: Not on file  Relationships  . Social Musicianconnections    Talks on phone: Not on file    Gets together: Not on file    Attends religious service: Not on file    Active member of club or organization: Not on file    Attends meetings  of clubs or organizations: Not on file    Relationship status: Not on file  Other Topics Concern  . Not on file  Social History Narrative  . Not on file   Additional Social History:  Allergies:        Allergies  Allergen Reactions  . Depakote [Valproic Acid] Other (See Comments)    Unknown   . Seroquel [Quetiapine Fumarate] Other (See Comments)    Back pain     Labs:  Lab Results Last 48 Hours        Results for orders placed or performed during the hospital encounter of 02/10/19 (from the past 48 hour(s))  CBC with Differential     Status: None   Collection Time: 02/10/19  4:33 PM  Result Value Ref Range   WBC 6.5 4.0 - 10.5 K/uL   RBC 5.16 4.22 - 5.81 MIL/uL   Hemoglobin 14.6 13.0 - 17.0 g/dL   HCT 81.146.7 91.439.0 - 78.252.0 %   MCV 90.5 80.0 - 100.0 fL   MCH 28.3 26.0 - 34.0 pg   MCHC 31.3 30.0 - 36.0 g/dL   RDW 95.613.6 21.311.5 - 08.615.5 %   Platelets 199 150 - 400 K/uL   nRBC 0.0 0.0 - 0.2 %   Neutrophils Relative % 65 %   Neutro Abs 4.2 1.7 - 7.7 K/uL   Lymphocytes Relative 28 %   Lymphs Abs 1.8 0.7 - 4.0 K/uL   Monocytes Relative 6 %   Monocytes Absolute 0.4 0.1 - 1.0 K/uL   Eosinophils Relative 1 %   Eosinophils Absolute 0.1 0.0 - 0.5 K/uL   Basophils Relative 0 %   Basophils Absolute 0.0 0.0 - 0.1 K/uL   Immature Granulocytes 0 %   Abs Immature Granulocytes 0.02 0.00 - 0.07 K/uL    Comment: Performed at [facility], [address] [place], [place] [address]  Comprehensive metabolic panel     Status: Abnormal   Collection Time: 02/10/19  4:33 PM  Result Value Ref Range   Sodium 137 135 - 145 mmol/L   Potassium 3.7 3.5 - 5.1 mmol/L   Chloride 101 98 - 111 mmol/L   CO2 28 22 - 32 mmol/L   Glucose, Bld 111 (H) 70 - 99 mg/dL   BUN 20 6 - 20 mg/dL   Creatinine, Ser 9.621.18 0.61 - 1.24 mg/dL   Calcium  8.8 (L) 8.9 - 10.3 mg/dL   Total Protein 7.0 6.5 - 8.1 g/dL   Albumin 3.6 3.5 - 5.0 g/dL   AST 20 15 - 41 U/L   ALT  16 0 - 44 U/L   Alkaline Phosphatase 46 38 - 126 U/L   Total Bilirubin 0.8 0.3 - 1.2 mg/dL   GFR calc non Af Amer >60 >60 mL/min   GFR calc Af Amer >60 >60 mL/min   Anion gap 8 5 - 15    Comment: Performed at [facility], [address], [place], [place] [address]  Ethanol     Status: None   Collection Time: 02/10/19  4:33 PM  Result Value Ref Range   Alcohol, Ethyl (B) <10 <10 mg/dL    Comment: (NOTE) Lowest detectable limit for serum alcohol is 10 mg/dL. For medical purposes only. Performed at [facility], [address] [place], [place] [address]   Acetaminophen level     Status: Abnormal   Collection Time: 02/10/19  4:33 PM  Result Value Ref Range   Acetaminophen (Tylenol), Serum <10 (L) 10 - 30 ug/mL    Comment: (NOTE) Therapeutic concentrations vary significantly. A range of 10-30 ug/mL  may be an effective concentration for many patients. However, some  are best treated at concentrations outside of this range. Acetaminophen concentrations >150 ug/mL at 4 hours after ingestion  and >50 ug/mL at 12 hours after ingestion are often associated with  toxic reactions. Performed at [facility], [address] [place], [place] [address]       No current facility-administered medications for this encounter.    No current outpatient medications on file.    Musculoskeletal: Strength & Muscle Tone: within normal limits Gait & Station: normal Patient leans: N/A  Psychiatric Specialty Exam: Physical Exam  Nursing note and vitals reviewed. Constitutional: He is oriented to person, place, and time.  Neck: Normal range of motion. Neck supple.  Cardiovascular: Normal rate.  Respiratory: Effort normal.  Musculoskeletal: Normal range of motion.  Neurological: He is alert and oriented to person, place, and time.  Psychiatric: His behavior is normal.    Review of Systems  Psychiatric/Behavioral: Positive for  hallucinations. The patient is nervous/anxious.   All other systems reviewed and are negative.   Blood pressure 118/78, pulse 97, temperature 98.4 F (36.9 C), temperature source Oral, resp. rate 20, height 6' (1.829 m), weight (!) 143.8 kg, SpO2 94 %.Body mass index is 42.99 kg/m.  General Appearance: Casual and Guarded  Eye Contact:  Good  Speech:  Clear and Coherent  Volume:  Normal  Mood:  Anxious and Depressed  Affect:  Congruent, Depressed and Flat  Thought Process:  Coherent  Orientation:  Full (Time, Place, and Person)  Thought Content:  Hallucinations: Auditory  Suicidal Thoughts:  No  Homicidal Thoughts:  No  Memory:  Immediate;   Good Recent;   Good Remote;   Fair  Judgement:  Intact  Insight:  Fair  Psychomotor Activity:  Normal  Concentration:  Concentration: Good and Attention Span: Good  Recall:  Good  Fund of Knowledge:  Good  Language:  Good  Akathisia:  Negative  Handed:  Right  AIMS (if indicated):     Assets:  Communication Skills Desire for Improvement Housing Physical Health Social Support  ADL's:  Intact  Cognition:  WNL  Sleep:   Good     Treatment Plan Summary: Daily contact with patient to assess and evaluate symptoms and progress in  treatment and Plan The patient will be observed overnight and reassess in the a.m. to determine if he meets criteria for psychiatric inpatient admission or discharge to a shelter.  Disposition: Discharge

## 2019-02-11 NOTE — Discharge Instructions (Addendum)
Return to the ER for any new or worsening symptoms including hallucinations, seeing or hearing things that are not there, invasive or racing thoughts, or any thoughts of wanting to hurt yourself or others.

## 2019-02-12 ENCOUNTER — Inpatient Hospital Stay
Admission: AD | Admit: 2019-02-12 | Discharge: 2019-03-06 | DRG: 885 | Disposition: A | Discharge status: Home / Self Care | Payer: Medicare Other | Attending: Psychiatry | Admitting: Psychiatry

## 2019-02-12 ENCOUNTER — Other Ambulatory Visit: Payer: Self-pay

## 2019-02-12 ENCOUNTER — Emergency Department
Admission: EM | Admit: 2019-02-12 | Discharge: 2019-02-12 | Disposition: A | Discharge status: Other Acute Inpatient Hospital | Payer: Medicare Other | Attending: Emergency Medicine | Admitting: Emergency Medicine

## 2019-02-12 ENCOUNTER — Encounter: Payer: Self-pay | Admitting: Emergency Medicine

## 2019-02-12 DIAGNOSIS — Z20828 Contact with and (suspected) exposure to other viral communicable diseases: Secondary | ICD-10-CM | POA: Diagnosis present

## 2019-02-12 DIAGNOSIS — J45909 Unspecified asthma, uncomplicated: Secondary | ICD-10-CM | POA: Diagnosis not present

## 2019-02-12 DIAGNOSIS — G4700 Insomnia, unspecified: Secondary | ICD-10-CM | POA: Diagnosis present

## 2019-02-12 DIAGNOSIS — F22 Delusional disorders: Secondary | ICD-10-CM | POA: Diagnosis not present

## 2019-02-12 DIAGNOSIS — F209 Schizophrenia, unspecified: Secondary | ICD-10-CM | POA: Diagnosis present

## 2019-02-12 DIAGNOSIS — F250 Schizoaffective disorder, bipolar type: Secondary | ICD-10-CM | POA: Insufficient documentation

## 2019-02-12 DIAGNOSIS — R440 Auditory hallucinations: Secondary | ICD-10-CM | POA: Diagnosis present

## 2019-02-12 DIAGNOSIS — R456 Violent behavior: Secondary | ICD-10-CM | POA: Insufficient documentation

## 2019-02-12 DIAGNOSIS — F200 Paranoid schizophrenia: Secondary | ICD-10-CM | POA: Diagnosis not present

## 2019-02-12 DIAGNOSIS — F419 Anxiety disorder, unspecified: Secondary | ICD-10-CM | POA: Diagnosis present

## 2019-02-12 DIAGNOSIS — Z008 Encounter for other general examination: Secondary | ICD-10-CM

## 2019-02-12 DIAGNOSIS — Z046 Encounter for general psychiatric examination, requested by authority: Secondary | ICD-10-CM | POA: Insufficient documentation

## 2019-02-12 DIAGNOSIS — F203 Undifferentiated schizophrenia: Secondary | ICD-10-CM | POA: Diagnosis not present

## 2019-02-12 DIAGNOSIS — F259 Schizoaffective disorder, unspecified: Secondary | ICD-10-CM | POA: Diagnosis present

## 2019-02-12 LAB — COMPREHENSIVE METABOLIC PANEL
ALT: 16 U/L (ref 0–44)
AST: 20 U/L (ref 15–41)
Albumin: 3.8 g/dL (ref 3.5–5.0)
Alkaline Phosphatase: 49 U/L (ref 38–126)
Anion gap: 6 (ref 5–15)
BUN: 20 mg/dL (ref 6–20)
CO2: 30 mmol/L (ref 22–32)
Calcium: 9.2 mg/dL (ref 8.9–10.3)
Chloride: 104 mmol/L (ref 98–111)
Creatinine, Ser: 0.89 mg/dL (ref 0.61–1.24)
GFR calc Af Amer: >60 mL/min (ref >60)
GFR calc non Af Amer: >60 mL/min (ref >60)
Glucose, Bld: 101 mg/dL — ABNORMAL HIGH (ref 70–99)
Potassium: 4.6 mmol/L (ref 3.5–5.1)
Sodium: 140 mmol/L (ref 135–145)
Total Bilirubin: 0.7 mg/dL (ref 0.3–1.2)
Total Protein: 7.3 g/dL (ref 6.5–8.1)

## 2019-02-12 LAB — CBC
HCT: 47.9 % (ref 39.0–52.0)
Hemoglobin: 15.1 g/dL (ref 13.0–17.0)
MCH: 28.2 pg (ref 26.0–34.0)
MCHC: 31.5 g/dL (ref 30.0–36.0)
MCV: 89.5 fL (ref 80.0–100.0)
Platelets: 223 10*3/uL (ref 150–400)
RBC: 5.35 MIL/uL (ref 4.22–5.81)
RDW: 13.7 % (ref 11.5–15.5)
WBC: 8.3 10*3/uL (ref 4.0–10.5)
nRBC: 0 % (ref 0.0–0.2)

## 2019-02-12 LAB — URINE DRUG SCREEN, QUALITATIVE (ARMC ONLY)
Amphetamines, Ur Screen: NOT DETECTED
Barbiturates, Ur Screen: NOT DETECTED
Benzodiazepine, Ur Scrn: NOT DETECTED
Cannabinoid 50 Ng, Ur ~~LOC~~: NOT DETECTED
Cocaine Metabolite,Ur ~~LOC~~: NOT DETECTED
MDMA (Ecstasy)Ur Screen: NOT DETECTED
Methadone Scn, Ur: NOT DETECTED
Opiate, Ur Screen: NOT DETECTED
Phencyclidine (PCP) Ur S: NOT DETECTED
Tricyclic, Ur Screen: NOT DETECTED

## 2019-02-12 LAB — SARS CORONAVIRUS 2 BY RT PCR (HOSPITAL ORDER, PERFORMED IN ~~LOC~~ HOSPITAL LAB): SARS Coronavirus 2: NEGATIVE

## 2019-02-12 LAB — ETHANOL: Alcohol, Ethyl (B): <10 mg/dL (ref <10)

## 2019-02-12 LAB — SALICYLATE LEVEL: Salicylate Lvl: <7 mg/dL (ref 2.8–30.0)

## 2019-02-12 LAB — ACETAMINOPHEN LEVEL: Acetaminophen (Tylenol), Serum: <10 ug/mL — ABNORMAL LOW (ref 10–30)

## 2019-02-12 MED ORDER — ACETAMINOPHEN 325 MG PO TABS: 650.0000 mg | ORAL_TABLET | Freq: Four times a day (QID) | ORAL | Status: DC | PRN

## 2019-02-12 MED ORDER — TRAZODONE HCL 100 MG PO TABS: 100.0000 mg | ORAL_TABLET | Freq: Every evening | ORAL | Status: DC | PRN

## 2019-02-12 MED ORDER — HALOPERIDOL 5 MG PO TABS
5.0000 mg | ORAL_TABLET | Freq: Every day | ORAL | Status: DC
  Administered 2019-02-12: 14:00:00 5 mg via ORAL
  Filled 2019-02-12: qty 1

## 2019-02-12 MED ORDER — LORAZEPAM 1 MG PO TABS
1.0000 mg | ORAL_TABLET | ORAL | Status: DC | PRN
  Administered 2019-02-12: 22:00:00 1 mg via ORAL
  Filled 2019-02-12: qty 1

## 2019-02-12 NOTE — ED Notes (Signed)
Hourly rounding reveals patient in room. No complaints, stable, in no acute distress. Q15 minute rounds and monitoring via Rover and Officer to continue.   

## 2019-02-12 NOTE — ED Notes (Signed)
Pt. Transferred from Triage to [facility] after dressing out and screening for contraband. Report to include Situation, Background, Assessment and Recommendations from [employer]. Pt. Oriented to Quad including Q15 minute rounds as well as Engineer, drilling for their protection. Patient is alert and oriented, warm and dry in no acute distress. Patient denies SI, HI, and reported AVH. Pt. Encouraged to let me know if needs arise.

## 2019-02-12 NOTE — ED Notes (Addendum)
Belongings: T-shirt, Pants, [employer], pair tennis shoes, socks, boxers.   Patient dressed out by this RN and Daiva Nakayama, Therapist, sports.

## 2019-02-12 NOTE — ED Notes (Signed)
Hourly rounding reveals patient in room. No complaints, stable, in no acute distress. Q15 minute rounds and monitoring via Security Cameras to continue. 

## 2019-02-12 NOTE — Social Work (Addendum)
Patient's previous group home owner, [facility] shared that patient needs to [facility]. Patient would need to receive a 3 inpatient night stay to qualify for SNF. Group home owner asked that information to be sent to [facility] [facility] [facility] anyway.   Patient does not have Medicaid to be considered for LTC.   Contact :  Suzanna Obey    [facility] [place] ED  [phone]

## 2019-02-12 NOTE — ED Notes (Signed)
Pt ambulatory to [facility]; escorted via this RN and [employer] officer.

## 2019-02-12 NOTE — ED Notes (Signed)
Snack and beverage given. 

## 2019-02-12 NOTE — BH Assessment (Signed)
Patient is to be admitted to [facility] BMU by [facility].  Attending Physician will be Dr. Weber Cooks.   Patient has been assigned to room 315, by [facility] Nurse Charlett Nose.   Intake Paper Work has been signed and placed on patient chart.   ER staff is aware of the admission:  Vaughan Basta, ER Secretary    Dr. Cinda Quest, ER MD   Dyke Maes, Patient's Nurse   Sharyn Lull, Patient Access.
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
## 2019-02-12 NOTE — ED Notes (Signed)
Pt provided gingerale upon request. 

## 2019-02-12 NOTE — ED Notes (Signed)
BEHAVIORAL HEALTH ROUNDING Patient sleeping: No. Patient alert and oriented: yes Behavior appropriate: Yes.  ; If no, describe:  Nutrition and fluids offered: yes Toileting and hygiene offered: Yes  Sitter present: q15 minute observations and security camera monitoring   

## 2019-02-12 NOTE — BH Assessment (Signed)
Pt seen by [facility].   Per Dr. Claris Gower, pt to be admitted.
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
## 2019-02-12 NOTE — ED Notes (Signed)
Pt given breakfast tray

## 2019-02-12 NOTE — ED Notes (Signed)
IVC/ Consult completed/ Pending placement/ Moved to BHU-1

## 2019-02-12 NOTE — ED Notes (Signed)
Report to include situation, background, assessment and recommendations from Amy RN. Patient sleeping, respirations regular and unlabored. Q15 minute rounds and security camera observation to continue.    

## 2019-02-12 NOTE — ED Notes (Signed)
Pt transferred into ED BHU room 1   Patient assigned to appropriate care area. Patient oriented to unit/care area: Informed that, for his safety, care areas are designed for safety and monitored by security cameras at all times; Visiting hours and phone times explained to patient. Patient verbalizes understanding, and verbal contract for safety obtained.   Assessment completed  He denies pain   

## 2019-02-12 NOTE — ED Notes (Signed)
Patient said " he doesn't want to take Depakote because it makes him hallucinate. He said he take haldol. It works better for him.
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
## 2019-02-12 NOTE — ED Notes (Signed)
Patient is discharged to [facility] via NT and officer. He is stable in NAD. Patient belongings taken with patient to BMU. Report given to [facility] RN charge. No issues.

## 2019-02-12 NOTE — ED Notes (Signed)
Breakfast tray provided to patient.

## 2019-02-12 NOTE — ED Notes (Signed)
ED BHU [place] Is the patient under IVC or is there intent for IVC: Yes.   Is the patient medically cleared: Yes.   Is there vacancy in the ED BHU: Yes.   Is the population mix appropriate for patient: Yes.   Is the patient awaiting placement in inpatient or outpatient setting: Yes.   Has the patient had a psychiatric consult: Yes.   Survey of unit performed for contraband, proper placement and condition of furniture, tampering with fixtures in bathroom, shower, and each patient room: Yes.  ; Findings:  APPEARANCE/BEHAVIOR Calm and cooperative NEURO ASSESSMENT Orientation: oriented x3  Denies pain Hallucinations: No.None noted (Hallucinations) denies Speech: Normal Gait: normal RESPIRATORY ASSESSMENT Even  Unlabored respirations  CARDIOVASCULAR ASSESSMENT Pulses equal   regular rate  Skin warm and dry   GASTROINTESTINAL ASSESSMENT no GI complaint EXTREMITIES Full ROM  PLAN OF CARE Provide calm/safe environment. Vital signs assessed twice daily. ED BHU Assessment once each 12-hour shift. Collaborate with TTS daily or as condition indicates. Assure the ED provider has rounded once each shift. Provide and encourage hygiene. Provide redirection as needed. Assess for escalating behavior; address immediately and inform ED provider.  Assess family dynamic and appropriateness for visitation as needed: Yes.  ; If necessary, describe findings:  Educate the patient/family about BHU procedures/visitation: Yes.  ; If necessary, describe findings:

## 2019-02-12 NOTE — ED Notes (Signed)
Patient provided lunch tray.
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
## 2019-02-12 NOTE — ED Notes (Signed)
Py ambulatory to bathroom independently.

## 2019-02-12 NOTE — ED Notes (Signed)
IVC/  PENDING  PLACEMENT 

## 2019-02-13 ENCOUNTER — Encounter: Payer: Self-pay | Admitting: Nurse Practitioner

## 2019-02-13 ENCOUNTER — Other Ambulatory Visit: Payer: Self-pay

## 2019-02-13 DIAGNOSIS — F203 Undifferentiated schizophrenia: Secondary | ICD-10-CM

## 2019-02-13 MED ORDER — MAGNESIUM HYDROXIDE 400 MG/5ML PO SUSP: 30.0000 mL | Freq: Every day | ORAL | Status: DC | PRN

## 2019-02-13 MED ORDER — ACETAMINOPHEN 325 MG PO TABS
650.0000 mg | ORAL_TABLET | Freq: Four times a day (QID) | ORAL | Status: DC | PRN
  Filled 2019-02-13: qty 2

## 2019-02-13 MED ORDER — ALUM & MAG HYDROXIDE-SIMETH 200-200-20 MG/5ML PO SUSP
30.0000 mL | ORAL | Status: DC | PRN
  Administered 2019-02-16 – 2019-02-20 (×2): 30 mL via ORAL
  Filled 2019-02-13 (×2): qty 30

## 2019-02-13 MED ORDER — FLUTICASONE PROPIONATE 50 MCG/ACT NA SUSP
2.0000 | Freq: Every day | NASAL | Status: DC
  Administered 2019-02-13 – 2019-03-04 (×8): 2 via NASAL
  Filled 2019-02-13: qty 16

## 2019-02-13 MED ORDER — HALOPERIDOL 5 MG PO TABS
5.0000 mg | ORAL_TABLET | Freq: Two times a day (BID) | ORAL | Status: DC
  Administered 2019-02-13 – 2019-02-14 (×2): 5 mg via ORAL
  Filled 2019-02-13 (×2): qty 1

## 2019-02-13 NOTE — Progress Notes (Signed)
Recreation Therapy Notes  Date:02/13/2019  Time: 9:30 am  Location: Craft room  Behavioral response: Appropriate   Intervention Topic: Goals  Discussion/Intervention:  Group content on today was focused on goals. Patients described what goals are and how they define goals. Individuals expressed how they go about setting goals and reaching them. The group identified how important goals are and if they make short term goals to reach long term goals. Patients described how many goals they work on at a time and what affects them not reaching their goal. Individuals described how much time they put into planning and obtaining their goals. The group participated in the intervention "My Goal Board" and made personal goal boards to help them achieve their goal. Clinical Observations/Feedback:  Patient came to group late due to unknown reasons. Individual was social with peers and staff while participating in group.  Eddie Holland LRT/CTRS          Eddie Holland 02/13/2019 11:05 AM
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
## 2019-02-13 NOTE — [facility] Suicide Risk Assessment (Signed)
[facility] Admission Suicide Risk Assessment   Nursing information obtained from:  Patient Demographic factors:  Male, Low socioeconomic status, Unemployed Current Mental Status:  NA Loss Factors:  Decline in physical health, Financial problems / change in socioeconomic status, Decrease in vocational status Historical Factors:  NA Risk Reduction Factors:  NA  Total Time spent with patient: 1 hour Principal Problem: Schizophrenia (HCC) Diagnosis:  Principal Problem:   Schizophrenia (HCC)  Subjective Data: Patient seen and chart reviewed.  Patient was a difficult historian and outside information is limited.  Patient came to the emergency room agitated and disorganized and paranoid.  Group home reports that he had been aggressive and argumentative there.  On interview today the patient denies suicidal or homicidal ideation but is easily worked up and easily starts to show anger although he also can calm down quickly and has not been threatening.  Denies any history of suicide.  Clearly paranoid and disordered in his thinking.  Continued Clinical Symptoms:  Alcohol Use Disorder Identification Test Final Score (AUDIT): 0 The "Alcohol Use Disorders Identification Test", Guidelines for Use in Primary Care, Second Edition.  World Science writer [facility]). Score between 0-7:  no or low risk or alcohol related problems. Score between 8-15:  moderate risk of alcohol related problems. Score between 16-19:  high risk of alcohol related problems. Score 20 or above:  warrants further diagnostic evaluation for alcohol dependence and treatment.   CLINICAL FACTORS:   Schizophrenia:   Paranoid or undifferentiated type   Musculoskeletal: Strength & Muscle Tone: within normal limits Gait & Station: normal Patient leans: N/A  Psychiatric Specialty Exam: Physical Exam  Nursing note and vitals reviewed. Constitutional: He appears well-developed and well-nourished.  HENT:  Head: Normocephalic and  atraumatic.  Eyes: Pupils are equal, round, and reactive to light. Conjunctivae are normal.  Neck: Normal range of motion.  Cardiovascular: Regular rhythm and normal heart sounds.  Respiratory: Effort normal. No respiratory distress.  GI: Soft.  Musculoskeletal: Normal range of motion.  Neurological: He is alert.  Skin: Skin is warm and dry.  Psychiatric: His affect is blunt. His speech is rapid and/or pressured. He is agitated. He is not aggressive. Thought content is paranoid and delusional. Cognition and memory are impaired. He expresses impulsivity and inappropriate judgment. He expresses no homicidal and no suicidal ideation.    Review of Systems  Constitutional: Negative.   HENT: Negative.   Eyes: Negative.   Respiratory: Negative.   Cardiovascular: Negative.   Gastrointestinal: Negative.   Musculoskeletal: Negative.   Skin: Negative.   Neurological: Negative.   Psychiatric/Behavioral: Positive for hallucinations. Negative for depression, memory loss, substance abuse and suicidal ideas. The patient is nervous/anxious and has insomnia.     Blood pressure (!) 104/50, pulse (!) 53, temperature 98.1 F (36.7 C), temperature source Oral, resp. rate 18, height 6' (1.829 m), weight 127 kg, SpO2 100 %.Body mass index is 37.97 kg/m.  General Appearance: Casual  Eye Contact:  Good  Speech:  Pressured  Volume:  Increased  Mood:  Irritable  Affect:  Congruent  Thought Process:  Disorganized  Orientation:  Full (Time, Place, and Person)  Thought Content:  Illogical, Delusions, Hallucinations: Auditory, Paranoid Ideation, Rumination and Tangential  Suicidal Thoughts:  No  Homicidal Thoughts:  No  Memory:  Immediate;   Fair Recent;   Fair Remote;   Fair  Judgement:  Impaired  Insight:  Shallow  Psychomotor Activity:  Normal  Concentration:  Concentration: Fair  Recall:  [employer] of  Knowledge:  Fair  Language:  Fair  Akathisia:  No  Handed:  Right  AIMS (if indicated):      Assets:  Desire for Improvement Housing Resilience Social Support  ADL's:  Intact  Cognition:  Impaired,  Mild  Sleep:  Number of Hours: 5      COGNITIVE FEATURES THAT CONTRIBUTE TO RISK:  Closed-mindedness    SUICIDE RISK:   Minimal: No identifiable suicidal ideation.  Patients presenting with no risk factors but with morbid ruminations; may be classified as minimal risk based on the severity of the depressive symptoms  PLAN OF CARE: Patient with schizophrenia who is very disorganized and somewhat agitated.  Continue 15-minute checks.  Try to get collateral information.  Engage patient in ongoing individual and group assessment and therapy.  Start antipsychotic medicine.  Reassess suicidality prior to discharge.  I certify that inpatient services furnished can reasonably be expected to improve the patient's condition.   Alethia Berthold, MD 02/13/2019, 2:47 PM

## 2019-02-13 NOTE — Progress Notes (Signed)
D: Pt denies SI/HI. Patient states he has auditory hallucinations. Patient rates his depression 0/10, hopelessness 0/10, and anxiety 0/10. Pt goal for today is to work on "speech by taking time to listen to my peers." A: Pt was offered support and encouragement. Pt was given scheduled medications. Pt was encourage to attend groups. Q 15 minute checks were done for safety.  R:Pt  interacts well with peers and staff. Pt is taking medication. Pt has no complaints.Pt receptive to treatment and safety maintained on unit.
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
## 2019-02-13 NOTE — Tx Team (Signed)
Initial Treatment Plan 02/13/2019 2:02 AM Eddie Holland HTX:[id number]    PATIENT STRESSORS: Financial difficulties Health problems Other: Living arrangement   PATIENT STRENGTHS: Ability for insight Communication skills Motivation for treatment/growth   PATIENT IDENTIFIED PROBLEMS: Inadequate living arrangement  Financial difficulties  Lack of support from family or friends. Mental illness per pt but unspecified                 DISCHARGE CRITERIA:  Ability to meet basic life and health needs Adequate post-discharge living arrangements Improved stabilization in mood, thinking, and/or behavior Medical problems require only outpatient monitoring Motivation to continue treatment in a less acute level of care  PRELIMINARY DISCHARGE PLAN: Attend aftercare/continuing care group Attend PHP/IOP Outpatient therapy Placement in alternative living arrangements  PATIENT/FAMILY INVOLVEMENT: This treatment plan has been presented to and reviewed with the patient, Eddie Holland. The patient have been given the opportunity to ask questions and make suggestions.  Randon Goldsmith, RN 02/13/2019, 2:02 AM

## 2019-02-13 NOTE — BHH Suicide Risk Assessment (Signed)
[facility] INPATIENT:  Family/Significant Other Suicide Prevention Education  Suicide Prevention Education:  Education Completed; Luisa Dago, payee/cousin, [phone] has been identified by the patient as the family member/significant other with whom the patient will be residing, and identified as the person(s) who will aid the patient in the event of a mental health crisis (suicidal ideations/suicide attempt).  With written consent from the patient, the family member/significant other has been provided the following suicide prevention education, prior to the and/or following the discharge of the patient.  The suicide prevention education provided includes the following:  Suicide risk factors  Suicide prevention and interventions  National Suicide Hotline telephone number  [facility] assessment telephone number  [facility] Emergency Assistance [place] and/or Residential Mobile Crisis Unit telephone number  Request made of family/significant other to:  Remove weapons (e.g., guns, rifles, knives), all items previously/currently identified as safety concern.    Remove drugs/medications (over-the-counter, prescriptions, illicit drugs), all items previously/currently identified as a safety concern.  The family member/significant other verbalizes understanding of the suicide prevention education information provided.  The family member/significant other agrees to remove the items of safety concern listed above.  Rozann Lesches 02/13/2019, 11:11 AM

## 2019-02-13 NOTE — Plan of Care (Signed)
D: Pt denies SI/HI. Patient complains of having auditory hallucinations. Patient can be demanding when asking for assistance.   A: Pt was offered support and encouragement. Pt was given scheduled medications. Pt was encourage to attend groups. Q 15 minute checks were done for safety.  R: Pt. interacts well with peers and staff. Pt is taking medication. Pt has no complaints.Pt receptive to treatment and safety maintained on unit.    Problem: Education: Goal: Knowledge of [place] [facility] Education information/materials will improve 02/13/2019 2125 by Jolene Provost, RN Outcome: Progressing 02/13/2019 1624 by Jolene Provost, RN Outcome: Progressing Goal: Emotional status will improve 02/13/2019 2125 by Jolene Provost, RN Outcome: Progressing 02/13/2019 1624 by Jolene Provost, RN Outcome: Progressing Goal: Mental status will improve Outcome: Progressing Goal: Verbalization of understanding the information provided will improve 02/13/2019 2125 by Jolene Provost, RN Outcome: Progressing 02/13/2019 1624 by Jolene Provost, RN Outcome: Progressing   Problem: Activity: Goal: Interest or engagement in activities will improve 02/13/2019 2125 by Jolene Provost, RN Outcome: Progressing 02/13/2019 1624 by Jolene Provost, RN Outcome: Progressing Goal: Sleeping patterns will improve Outcome: Progressing   Problem: Coping: Goal: Ability to verbalize frustrations and anger appropriately will improve 02/13/2019 2125 by Jolene Provost, RN Outcome: Progressing 02/13/2019 1624 by Jolene Provost, RN Outcome: Progressing Goal: Ability to demonstrate self-control will improve 02/13/2019 2125 by Jolene Provost, RN Outcome: Progressing 02/13/2019 1624 by Jolene Provost, RN Outcome: Progressing   Problem: Health Behavior/Discharge Planning: Goal: Compliance with treatment plan for underlying cause of condition will improve Outcome: Progressing   Problem: Physical  Regulation: Goal: Ability to maintain clinical measurements within normal limits will improve Outcome: Progressing   Problem: Safety: Goal: Periods of time without injury will increase Outcome: Progressing
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
## 2019-02-13 NOTE — Tx Team (Signed)
Interdisciplinary Treatment and Diagnostic Plan Update  02/13/2019 Time of Session: Eddie Holland MRN: [id number]  Principal Diagnosis: <principal problem not specified>  Secondary Diagnoses: Active Problems:   Schizophrenia ([place])   Current Medications:  Current Facility-Administered Medications  Medication Dose Route Frequency Provider Last Rate Last Dose  . acetaminophen (TYLENOL) tablet 650 mg  650 mg Oral Q6H PRN Clapacs, John T, MD      . alum & mag hydroxide-simeth (MAALOX/MYLANTA) 200-200-20 MG/5ML suspension 30 mL  30 mL Oral Q4H PRN Clapacs, John T, MD      . magnesium hydroxide (MILK OF MAGNESIA) suspension 30 mL  30 mL Oral Daily PRN Clapacs, Madie Reno, MD       PTA Medications: No medications prior to admission.    Patient Stressors: Financial difficulties Health problems Other: Living arrangement  Patient Strengths: Ability for insight Communication skills Motivation for treatment/growth  Treatment Modalities: Medication Management, Group therapy, Case management,  1 to 1 session with clinician, Psychoeducation, Recreational therapy.   Physician Treatment Plan for Primary Diagnosis: <principal problem not specified> Long Term Goal(s):     Short Term Goals:    Medication Management: Evaluate patient's response, side effects, and tolerance of medication regimen.  Therapeutic Interventions: 1 to 1 sessions, Unit Group sessions and Medication administration.  Evaluation of Outcomes: Not Met  Physician Treatment Plan for Secondary Diagnosis: Active Problems:   Schizophrenia ([place])  Long Term Goal(s):     Short Term Goals:       Medication Management: Evaluate patient's response, side effects, and tolerance of medication regimen.  Therapeutic Interventions: 1 to 1 sessions, Unit Group sessions and Medication administration.  Evaluation of Outcomes: Not Met   RN Treatment Plan for Primary Diagnosis: <principal problem not specified> Long Term  Goal(s): Knowledge of disease and therapeutic regimen to maintain health will improve  Short Term Goals: Ability to demonstrate self-control, Ability to participate in decision making will improve, Ability to identify and develop effective coping behaviors will improve and Compliance with prescribed medications will improve  Medication Management: RN will administer medications as ordered by provider, will assess and evaluate patient's response and provide education to patient for prescribed medication. RN will report any adverse and/or side effects to prescribing provider.  Therapeutic Interventions: 1 on 1 counseling sessions, Psychoeducation, Medication administration, Evaluate responses to treatment, Monitor vital signs and CBGs as ordered, Perform/monitor CIWA, COWS, AIMS and Fall Risk screenings as ordered, Perform wound care treatments as ordered.  Evaluation of Outcomes: Not Met   LCSW Treatment Plan for Primary Diagnosis: <principal problem not specified> Long Term Goal(s): Safe transition to appropriate next level of care at discharge, Engage patient in therapeutic group addressing interpersonal concerns.  Short Term Goals: Engage patient in aftercare planning with referrals and resources  Therapeutic Interventions: Assess for all discharge needs, 1 to 1 time with Social worker, Explore available resources and support systems, Assess for adequacy in community support network, Educate family and significant other(s) on suicide prevention, Complete Psychosocial Assessment, Interpersonal group therapy.  Evaluation of Outcomes: Not Met   Progress in Treatment: Attending groups: No. Participating in groups: No. Taking medication as prescribed: Yes. Toleration medication: Yes. Family/Significant other contact made: No, will contact:  when pt gives consent Patient understands diagnosis: No. Discussing patient identified problems/goals with staff: Yes. Medical problems stabilized or  resolved: No. Denies suicidal/homicidal ideation: Yes. Issues/concerns per patient self-inventory: No. Other: NA  New problem(s) identified: No, Describe:  None reported  New Short Term/Long Term Goal(s):Attend outpatient  treatment, take medication as prescribed, develop and implement healthy coping methods  Patient Goals:  "Go back to my state"  Discharge Plan or Barriers: Pt will return to his group home and follow up with outpatient treatment.  Reason for Continuation of Hospitalization: Hallucinations Medication stabilization  Estimated Length of Stay:1-7 days  Attendees: Patient:Eddie Holland [facility] 02/13/2019 10:41 AM  Physician: Alethia Berthold 02/13/2019 10:41 AM  Nursing: Polly Cobia 02/13/2019 10:41 AM  RN Care Manager: 02/13/2019 10:41 AM  Social Worker: Sanjuana Kava [facility] 02/13/2019 10:41 AM  Recreational Therapist: Isaias Sakai [facility] 02/13/2019 10:41 AM  Other:  02/13/2019 10:41 AM  Other:  02/13/2019 10:41 AM  Other: 02/13/2019 10:41 AM    Scribe for Treatment Team: Yvette Rack, LCSW 02/13/2019 10:41 AM

## 2019-02-13 NOTE — Plan of Care (Signed)
New admission. Safety maintained. Problem: Education: Goal: Knowledge of  General Education information/materials will improve Outcome: Progressing Goal: Emotional status will improve Outcome: Progressing Goal: Mental status will improve Outcome: Progressing Goal: Verbalization of understanding the information provided will improve Outcome: Progressing   Problem: Activity: Goal: Interest or engagement in activities will improve Outcome: Progressing Goal: Sleeping patterns will improve Outcome: Progressing   Problem: Coping: Goal: Ability to verbalize frustrations and anger appropriately will improve Outcome: Progressing Goal: Ability to demonstrate self-control will improve Outcome: Progressing   Problem: Health Behavior/Discharge Planning: Goal: Identification of resources available to assist in meeting health care needs will improve Outcome: Progressing Goal: Compliance with treatment plan for underlying cause of condition will improve Outcome: Progressing   Problem: Physical Regulation: Goal: Ability to maintain clinical measurements within normal limits will improve Outcome: Progressing   Problem: Safety: Goal: Periods of time without injury will increase Outcome: Progressing

## 2019-02-13 NOTE — H&P (Signed)
Psychiatric Admission Assessment Adult  Patient Identification: Eddie Holland MRN:  [id number] Date of Evaluation:  02/13/2019 Chief Complaint:  Schizoaffective Disorder Principal Diagnosis: Schizophrenia (HCC) Diagnosis:  Principal Problem:   Schizophrenia (HCC)  History of Present Illness: Patient seen and chart reviewed.  This is a 58 year old man sent to the emergency room from his group home.  Patient is not a very good historian and outside information is limited.  From what I can put together the group home sent him to the emergency room because he was getting belligerent and argumentative and refusing to take medicine.  I do not see anything about any specific assaults.  Patient tells me that the group home was trying to make him take medicine that he does not like specifically Depakote and that he got into an argument with him.  He denies suicidal or homicidal ideation.  I believe he tells me that he has only been at that group home for about a week and prior to that was at [facility] psychiatry ward for 2 months.  I could not find any records related to that in the computer so far.  Most of his history is pressured and rambling and disorganized filled with paranoid content about members of his family.  Hard to know how much of it to believe.  Patient denies alcohol or drug abuse.  He says he has auditory hallucinations which are chronic.  He tells me that the only medicine he wants to take is either Haldol or Ativan. Associated Signs/Symptoms: Depression Symptoms:  anhedonia, psychomotor agitation, (Hypo) Manic Symptoms:  Irritable Mood, Anxiety Symptoms:  Nothing reported Psychotic Symptoms:  Delusions, Hallucinations: Auditory Ideas of Reference, Paranoia, PTSD Symptoms: Negative Total Time spent with patient: 1 hour  Past Psychiatric History: Not sure how much of this is reliable but scattered between the other parts of his history I gather that he has been diagnosed with  schizophrenia or schizoaffective disorder for a long time.  He has had multiple hospitalizations.  He had previously lived in [place] but at some point move to [place] apparently after his wife passed away.  He tells me that his mother is still living and that she lives in [place] and is his legal guardian.  He has a lot of paranoid and disorganized animosity towards her.  He denies ever having tried to kill himself.  He rattles off multiple names of antipsychotics and mood stabilizers all of which she says have done him wrong and that he does not like.  The only medicine he likes is either Haldol or Ativan.  Is the patient at risk to self? Yes.    Has the patient been a risk to self in the past 6 months? Yes.    Has the patient been a risk to self within the distant past? Yes.    Is the patient a risk to others? Yes.    Has the patient been a risk to others in the past 6 months? Yes.    Has the patient been a risk to others within the distant past? Yes.     Prior Inpatient Therapy:   Prior Outpatient Therapy:    Alcohol Screening: 1. How often do you have a drink containing alcohol?: Never 2. How many drinks containing alcohol do you have on a typical day when you are drinking?: 1 or 2 3. How often do you have six or more drinks on one occasion?: Never AUDIT-C Score: 0 4. How often during the  last year have you found that you were not able to stop drinking once you had started?: Never 5. How often during the last year have you failed to do what was normally expected from you becasue of drinking?: Never 6. How often during the last year have you needed a first drink in the morning to get yourself going after a heavy drinking session?: Never 7. How often during the last year have you had a feeling of guilt of remorse after drinking?: Never 8. How often during the last year have you been unable to remember what happened the night before because you had been drinking?: Never 9. Have  you or someone else been injured as a result of your drinking?: No 10. Has a relative or friend or a doctor or another health worker been concerned about your drinking or suggested you cut down?: No Alcohol Use Disorder Identification Test Final Score (AUDIT): 0 Alcohol Brief Interventions/Follow-up: AUDIT Score <7 follow-up not indicated Substance Abuse History in the last 12 months:  No. Consequences of Substance Abuse: Negative Previous Psychotropic Medications: Yes  Psychological Evaluations: Yes  Past Medical History:  Past Medical History:  Diagnosis Date  . Asthma   . Back pain   . Paranoia [facility])     Past Surgical History:  Procedure Laterality Date  . CHOLECYSTECTOMY     Family History: History reviewed. No pertinent family history. Family Psychiatric  History: Denies any family history Tobacco Screening: Have you used any form of tobacco in the last 30 days? (Cigarettes, Smokeless Tobacco, Cigars, and/or Pipes): No Social History:  Social History   Substance and Sexual Activity  Alcohol Use No     Social History   Substance and Sexual Activity  Drug Use No    Additional Social History: Marital status: Widowed Widowed, when?: 2011 Are you sexually active?: No What is your sexual orientation?: Heterosexual Does patient have children?: No                         Allergies:   Allergies  Allergen Reactions  . Depakote [Valproic Acid] Other (See Comments)    Unknown   . Seroquel [Quetiapine Fumarate] Other (See Comments)    Back pain    Lab Results:  Results for orders placed or performed during the hospital encounter of 02/12/19 (from the past 48 hour(s))  Comprehensive metabolic panel     Status: Abnormal   Collection Time: 02/12/19  4:40 AM  Result Value Ref Range   Sodium 140 135 - 145 mmol/L   Potassium 4.6 3.5 - 5.1 mmol/L   Chloride 104 98 - 111 mmol/L   CO2 30 22 - 32 mmol/L   Glucose, Bld 101 (H) 70 - 99 mg/dL   BUN 20 6 - 20 mg/dL    Creatinine, Ser 0.89 0.61 - 1.24 mg/dL   Calcium 9.2 8.9 - 10.3 mg/dL   Total Protein 7.3 6.5 - 8.1 g/dL   Albumin 3.8 3.5 - 5.0 g/dL   AST 20 15 - 41 U/L   ALT 16 0 - 44 U/L   Alkaline Phosphatase 49 38 - 126 U/L   Total Bilirubin 0.7 0.3 - 1.2 mg/dL   GFR calc non Af Amer >60 >60 mL/min   GFR calc Af Amer >60 >60 mL/min   Anion gap 6 5 - 15    Comment: Performed at [facility], [address] [place], [place] [address]  Ethanol     Status:  None   Collection Time: 02/12/19  4:40 AM  Result Value Ref Range   Alcohol, Ethyl (B) <10 <10 mg/dL    Comment: (NOTE) Lowest detectable limit for serum alcohol is 10 mg/dL. For medical purposes only. Performed at [facility], [address] [place], [place] [address]   Salicylate level     Status: None   Collection Time: 02/12/19  4:40 AM  Result Value Ref Range   Salicylate Lvl <7.0 2.8 - 30.0 mg/dL    Comment: Performed at [facility], [address] [place], [place] [address]  Acetaminophen level     Status: Abnormal   Collection Time: 02/12/19  4:40 AM  Result Value Ref Range   Acetaminophen (Tylenol), Serum <10 (L) 10 - 30 ug/mL    Comment: (NOTE) Therapeutic concentrations vary significantly. A range of 10-30 ug/mL  may be an effective concentration for many patients. However, some  are best treated at concentrations outside of this range. Acetaminophen concentrations >150 ug/mL at 4 hours after ingestion  and >50 ug/mL at 12 hours after ingestion are often associated with  toxic reactions. Performed at [facility], [address] [place], [place] [address]   cbc     Status: None   Collection Time: 02/12/19  4:40 AM  Result Value Ref Range   WBC 8.3 4.0 - 10.5 K/uL   RBC 5.35 4.22 - 5.81 MIL/uL   Hemoglobin 15.1 13.0 - 17.0 g/dL   HCT 91.4 78.2 - 95.6 %   MCV 89.5 80.0 - 100.0 fL   MCH 28.2 26.0 - 34.0 pg   MCHC 31.5 30.0 - 36.0 g/dL   RDW 21.3 08.6 - 57.8 %   Platelets  223 150 - 400 K/uL   nRBC 0.0 0.0 - 0.2 %    Comment: Performed at [facility], [address], [place], [place] [address]  Urine Drug Screen, Qualitative     Status: None   Collection Time: 02/12/19  8:27 AM  Result Value Ref Range   Tricyclic, Ur Screen NONE DETECTED NONE DETECTED   Amphetamines, Ur Screen NONE DETECTED NONE DETECTED   MDMA (Ecstasy)Ur Screen NONE DETECTED NONE DETECTED   Cocaine Metabolite,Ur [place] NONE DETECTED NONE DETECTED   Opiate, Ur Screen NONE DETECTED NONE DETECTED   Phencyclidine (PCP) Ur S NONE DETECTED NONE DETECTED   Cannabinoid 50 Ng, Ur [place] NONE DETECTED NONE DETECTED   Barbiturates, Ur Screen NONE DETECTED NONE DETECTED   Benzodiazepine, Ur Scrn NONE DETECTED NONE DETECTED   Methadone Scn, Ur NONE DETECTED NONE DETECTED    Comment: (NOTE) Tricyclics + metabolites, urine    Cutoff 1000 ng/mL Amphetamines + metabolites, urine  Cutoff 1000 ng/mL MDMA (Ecstasy), urine              Cutoff 500 ng/mL Cocaine Metabolite, urine          Cutoff 300 ng/mL Opiate + metabolites, urine        Cutoff 300 ng/mL Phencyclidine (PCP), urine         Cutoff 25 ng/mL Cannabinoid, urine                 Cutoff 50 ng/mL Barbiturates + metabolites, urine  Cutoff 200 ng/mL Benzodiazepine, urine              Cutoff 200 ng/mL Methadone, urine                   Cutoff 300 ng/mL The urine drug screen  provides only a preliminary, unconfirmed analytical test result and should not be used for non-medical purposes. Clinical consideration and professional judgment should be applied to any positive drug screen result due to possible interfering substances. A more specific alternate chemical method must be used in order to obtain a confirmed analytical result. Gas chromatography / mass spectrometry (GC/MS) is the preferred confirmat ory method. Performed at [facility], [address] [place], [place] [address]   SARS Coronavirus 2 by RT PCR (hospital order,  performed in [facility] hospital lab) Nasopharyngeal Nasopharyngeal Swab     Status: None   Collection Time: 02/12/19 12:14 PM   Specimen: Nasopharyngeal Swab  Result Value Ref Range   SARS Coronavirus 2 NEGATIVE NEGATIVE    Comment: (NOTE) If result is NEGATIVE SARS-CoV-2 target nucleic acids are NOT DETECTED. The SARS-CoV-2 RNA is generally detectable in upper and lower  respiratory specimens during the acute phase of infection. The lowest  concentration of SARS-CoV-2 viral copies this assay can detect is 250  copies / mL. A negative result does not preclude SARS-CoV-2 infection  and should not be used as the sole basis for treatment or other  patient management decisions.  A negative result may occur with  improper specimen collection / handling, submission of specimen other  than nasopharyngeal swab, presence of viral mutation(s) within the  areas targeted by this assay, and inadequate number of viral copies  (<250 copies / mL). A negative result must be combined with clinical  observations, patient history, and epidemiological information. If result is POSITIVE SARS-CoV-2 target nucleic acids are DETECTED. The SARS-CoV-2 RNA is generally detectable in upper and lower  respiratory specimens dur ing the acute phase of infection.  Positive  results are indicative of active infection with SARS-CoV-2.  Clinical  correlation with patient history and other diagnostic information is  necessary to determine patient infection status.  Positive results do  not rule out bacterial infection or co-infection with other viruses. If result is PRESUMPTIVE POSTIVE SARS-CoV-2 nucleic acids MAY BE PRESENT.   A presumptive positive result was obtained on the submitted specimen  and confirmed on repeat testing.  While 2019 novel coronavirus  (SARS-CoV-2) nucleic acids may be present in the submitted sample  additional confirmatory testing may be necessary for epidemiological  and / or clinical  management purposes  to differentiate between  SARS-CoV-2 and other Sarbecovirus currently known to infect humans.  If clinically indicated additional testing with an alternate test  methodology [phone]) is advised. The SARS-CoV-2 RNA is generally  detectable in upper and lower respiratory sp ecimens during the acute  phase of infection. The expected result is Negative. Fact Sheet for Patients:  BoilerBrush.com.cy Fact Sheet for Healthcare Providers: https://pope.com/ This test is not yet approved or cleared by the [place] FDA and has been authorized for detection and/or diagnosis of SARS-CoV-2 by FDA under an Emergency Use Authorization (EUA).  This EUA will remain in effect (meaning this test can be used) for the duration of the COVID-19 declaration under Section 564(b)(1) of the Act, 21 U.S.C. section 360bbb-3(b)(1), unless the authorization is terminated or revoked sooner. Performed at [facility], [address] [place], [place] [address]     Blood Alcohol level:  Lab Results  Component Value Date   [facility] <10 02/12/2019   ETH <10 02/10/2019    Metabolic Disorder Labs:  No results found for: HGBA1C, MPG No results found for: PROLACTIN No results found for: CHOL, TRIG, HDL, CHOLHDL, VLDL,  LDLCALC  Current Medications: Current Facility-Administered Medications  Medication Dose Route Frequency Provider Last Rate Last Dose  . acetaminophen (TYLENOL) tablet 650 mg  650 mg Oral Q6H PRN Jeison Delpilar T, MD      . alum & mag hydroxide-simeth (MAALOX/MYLANTA) 200-200-20 MG/5ML suspension 30 mL  30 mL Oral Q4H PRN Jaskirat Schwieger T, MD      . haloperidol (HALDOL) tablet 5 mg  5 mg Oral BID Aretta Stetzel T, MD      . magnesium hydroxide (MILK OF MAGNESIA) suspension 30 mL  30 mL Oral Daily PRN Charo Philipp, Jackquline DenmarkJohn T, MD       PTA Medications: No medications prior to admission.    Musculoskeletal: Strength & Muscle Tone:  within normal limits Gait & Station: normal Patient leans: N/A  Psychiatric Specialty Exam: Physical Exam  Nursing note and vitals reviewed. Constitutional: He appears well-developed and well-nourished.  HENT:  Head: Normocephalic and atraumatic.  Eyes: Pupils are equal, round, and reactive to light. Conjunctivae are normal.  Neck: Normal range of motion.  Cardiovascular: Regular rhythm and normal heart sounds.  Respiratory: Effort normal.  GI: Soft.  Musculoskeletal: Normal range of motion.  Neurological: He is alert.  Skin: Skin is warm and dry.     Psychiatric: His affect is angry. His speech is rapid and/or pressured and tangential. He is agitated. He is not aggressive. Thought content is paranoid and delusional. Cognition and memory are impaired. He expresses impulsivity. He expresses no homicidal and no suicidal ideation.    Review of Systems  Constitutional: Negative.   HENT: Negative.   Eyes: Negative.   Respiratory: Negative.   Cardiovascular: Negative.   Gastrointestinal: Negative.   Musculoskeletal: Negative.   Skin: Negative.   Neurological: Negative.   Psychiatric/Behavioral: Positive for hallucinations. Negative for depression, memory loss, substance abuse and suicidal ideas. The patient is nervous/anxious and has insomnia.     Blood pressure (!) 104/50, pulse (!) 53, temperature 98.1 F (36.7 C), temperature source Oral, resp. rate 18, height 6' (1.829 m), weight 127 kg, SpO2 100 %.Body mass index is 37.97 kg/m.  General Appearance: Casual  Eye Contact:  Good  Speech:  Pressured  Volume:  Increased  Mood:  Irritable  Affect:  Constricted  Thought Process:  Disorganized  Orientation:  Full (Time, Place, and Person)  Thought Content:  Illogical, Delusions, Hallucinations: Auditory, Ideas of Reference:   Paranoia, Paranoid Ideation, Rumination and Tangential  Suicidal Thoughts:  No  Homicidal Thoughts:  No  Memory:  Immediate;   Fair Recent;   Fair  Remote;   Fair  Judgement:  Impaired  Insight:  Shallow  Psychomotor Activity:  Normal  Concentration:  Concentration: Fair  Recall:  [employer]Fair  Fund of Knowledge:  Fair  Language:  Poor  Akathisia:  Negative  Handed:  Right  AIMS (if indicated):     Assets:  Desire for Improvement Financial Resources/Insurance Housing Physical Health Resilience Social Support Talents/Skills  ADL's:  Intact  Cognition:  Impaired,  Mild  Sleep:  Number of Hours: 5    Treatment Plan Summary: Daily contact with patient to assess and evaluate symptoms and progress in treatment, Medication management and Plan Continue 15-minute checks.  I am going to keep trying [facility] since he was so clear about having recently been there but in the meantime if the only medicines he will take our Haldol and Ativan and we can certainly start there.  I suggested to him adding Cogentin but he  said he did not need it.  Start haloperidol 5 mg twice a day.  Patient appears to be in remarkably good health otherwise with normal blood pressure normal blood sugars.  We will go ahead though and get the EKG the TSH and the lipid panel.  Engage him in individual and group therapy.  We can try and get some information from the group home if they might have a little more information about him.  He gave me a phone number that he says is his mother in [place] and I may try that as well.  Observation Level/Precautions:  15 minute checks  Laboratory:  EKG  Psychotherapy:    Medications:    Consultations:    Discharge Concerns:    Estimated LOS:  Other:     Physician Treatment Plan for Primary Diagnosis: Schizophrenia (HCC) Long Term Goal(s): Improvement in symptoms so as ready for discharge  Short Term Goals: Ability to verbalize feelings will improve, Ability to demonstrate self-control will improve and Ability to identify and develop effective coping behaviors will improve  Physician Treatment Plan for  Secondary Diagnosis: Principal Problem:   Schizophrenia (HCC)  Long Term Goal(s): Improvement in symptoms so as ready for discharge  Short Term Goals: Compliance with prescribed medications will improve  I certify that inpatient services furnished can reasonably be expected to improve the patient's condition.    Mordecai Rasmussen, MD 10/15/20202:52 PM

## 2019-02-13 NOTE — BHH Group Notes (Signed)
Balance In Life 02/13/2019 1PM  Type of Therapy/Topic:  Group Therapy:  Balance in Life  Participation Level:  Minimal  Description of Group:   This group will address the concept of balance and how it feels and looks when one is unbalanced. Patients will be encouraged to process areas in their lives that are out of balance and identify reasons for remaining unbalanced. Facilitators will guide patients in utilizing problem-solving interventions to address and correct the stressor making their life unbalanced. Understanding and applying boundaries will be explored and addressed for obtaining and maintaining a balanced life. Patients will be encouraged to explore ways to assertively make their unbalanced needs known to significant others in their lives, using other group members and facilitator for support and feedback.  Therapeutic Goals: 1. Patient will identify two or more emotions or situations they have that consume much of in their lives. 2. Patient will identify signs/triggers that life has become out of balance:  3. Patient will identify two ways to set boundaries in order to achieve balance in their lives:  4. Patient will demonstrate ability to communicate their needs through discussion and/or role plays  Summary of Patient Progress: Pt attended session but minimal participation. Pt required redirection as he displayed disorganized thinking and difficulty staying on topic. Pt did try to follow along with the self care worksheet that was provided.   Therapeutic Modalities:   Cognitive Behavioral Therapy Solution-Focused Therapy Assertiveness Training  Hudson Lehmkuhl Lynelle Smoke, LCSW

## 2019-02-13 NOTE — BHH Counselor (Signed)
CSW spoke with the patient's group home, [facility], [place].  She reports that the patient has been living there for 2 weeks.  She reports that she is unsure if the patient can return or not. Patient had requested a 2 week notice and Susan's aunt was looking for placement for the patient.  Manuela Schwartz was not sure when the 2 weeks started or if a place has been found.  She was going to go to work at 4 and follow up with CSW.    Manuela Schwartz reports patient "has a problem with women telling him what to do".  She reports that she was unaware of the patient's behaviors and previous trip to [facility] but patient arrive home that evening and became agitated and started throwing things at her.  She reports that this prompted the patient to return to the hospital.    She reports that she has not been contact with the payee.  Assunta Curtis, MSW, LCSW 02/13/2019 11:52 AM

## 2019-02-13 NOTE — Plan of Care (Signed)
  Problem: Education: Goal: Knowledge of [place] [facility] Education information/materials will improve Outcome: Progressing Goal: Emotional status will improve Outcome: Progressing Goal: Verbalization of understanding the information provided will improve Outcome: Progressing   Problem: Activity: Goal: Interest or engagement in activities will improve Outcome: Progressing   Problem: Coping: Goal: Ability to verbalize frustrations and anger appropriately will improve Outcome: Progressing Goal: Ability to demonstrate self-control will improve Outcome: Progressing

## 2019-02-14 DIAGNOSIS — F203 Undifferentiated schizophrenia: Secondary | ICD-10-CM | POA: Diagnosis not present

## 2019-02-14 LAB — LIPID PANEL
Cholesterol: 137 mg/dL (ref 0–200)
HDL: 56 mg/dL (ref >40)
LDL Cholesterol: 73 mg/dL (ref 0–99)
Total CHOL/HDL Ratio: 2.4 RATIO
Triglycerides: 42 mg/dL (ref <150)
VLDL: 8 mg/dL (ref 0–40)

## 2019-02-14 LAB — TSH: TSH: 2.442 u[IU]/mL (ref 0.350–4.500)

## 2019-02-14 MED ORDER — HALOPERIDOL 5 MG PO TABS
10.0000 mg | ORAL_TABLET | Freq: Every day | ORAL | Status: DC
  Administered 2019-02-14 – 2019-02-15 (×2): 10 mg via ORAL
  Filled 2019-02-14 (×2): qty 2

## 2019-02-14 MED ORDER — HYDROXYZINE HCL 50 MG PO TABS
50.0000 mg | ORAL_TABLET | Freq: Three times a day (TID) | ORAL | Status: DC | PRN
  Administered 2019-02-15 – 2019-03-04 (×4): 50 mg via ORAL
  Filled 2019-02-14 (×9): qty 1

## 2019-02-14 MED ORDER — HALOPERIDOL 5 MG PO TABS
5.0000 mg | ORAL_TABLET | Freq: Every day | ORAL | Status: DC
  Administered 2019-02-15: 09:00:00 5 mg via ORAL
  Filled 2019-02-14: qty 1

## 2019-02-14 MED ORDER — NAPROXEN 500 MG PO TABS
500.0000 mg | ORAL_TABLET | Freq: Two times a day (BID) | ORAL | Status: DC
  Administered 2019-02-16 – 2019-03-06 (×21): 500 mg via ORAL
  Filled 2019-02-14 (×41): qty 1

## 2019-02-14 NOTE — BHH Group Notes (Signed)
LCSW Group Therapy Note  02/14/2019 12:00 PM  Type of Therapy and Topic:  Group Therapy:  Feelings around Relapse and Recovery  Participation Level:  Active   Description of Group:    Patients in this group will discuss emotions they experience before and after a relapse. They will process how experiencing these feelings, or avoidance of experiencing them, relates to having a relapse. Facilitator will guide patients to explore emotions they have related to recovery. Patients will be encouraged to process which emotions are more powerful. They will be guided to discuss the emotional reaction significant others in their lives may have to their relapse or recovery. Patients will be assisted in exploring ways to respond to the emotions of others without this contributing to a relapse.  Therapeutic Goals: 1. Patient will identify two or more emotions that lead to a relapse for them 2. Patient will identify two emotions that result when they relapse 3. Patient will identify two emotions related to recovery 4. Patient will demonstrate ability to communicate their needs through discussion and/or role plays   Summary of Patient Progress: Pt was appropriate and respectful in group. Pt was able to identify a relapse as "when you're drinking and then you're clean and then you go back to drinking". Pt discussed having experience with relapse as it relates to alcohol and drugs and was able to identify things that help keep him sober. Pt was able to identify appropriate coping strategies. Pt became triggered in group by another pt, but was able to be redirected.    Therapeutic Modalities:   Cognitive Behavioral Therapy Solution-Focused Therapy Assertiveness Training Relapse Prevention Therapy   Evalina Field, MSW, LCSW Clinical Social Work 02/14/2019 12:00 PM

## 2019-02-14 NOTE — Progress Notes (Signed)
[facility] MD Progress Note  02/14/2019 11:32 AM Eddie Holland  MRN:  [id number]   Subjective: Patient reports today that he feels that he is doing good.  Patient continues to report that he will only take the medication Haldol and that he was on it for approximately 20 years.  He states that he was on 10 mg twice a day and that did help him and then patient states that he does not really need medications and the only reason he comes here to tell people he needs medications because he is homeless and needs a place to sleep.  Then patient states that the reason he came to the hospital was because there is a group of people out there that is trying to kill veterans and people that were in the [employer] and that there were several members of his family that were in the [employer] and he needed to stop it.  Then patient starts reporting about being in various facilities and that they were trying to take his manhood and turned him into a "faggot" so that people would kill him.  Patient then states that these facilities have been trying to kill him and take his manhood.  He states he knows a giving medications and come and take your "for crack, you know what that is intravenous."  Patient does not have any suicidal or homicidal ideations and denies having any visual hallucinations.  He states that when he takes pills, unspecified which ones, he has like feedback noise inside of his head.  He then states that his father had the same response to medications however his father took medications for having auditory hallucinations as well.  Principal Problem: Schizophrenia (HCC) Diagnosis: Principal Problem:   Schizophrenia (HCC)  Total Time spent with patient: 30 minutes  Past Psychiatric History: Not sure how much of this is reliable but scattered between the other parts of his history I gather that he has been diagnosed with schizophrenia or schizoaffective disorder for a long time.  He has had multiple hospitalizations.   He had previously lived in [place] but at some point move to [place] apparently after his wife passed away.  He tells me that his mother is still living and that she lives in [place] and is his legal guardian.  He has a lot of paranoid and disorganized animosity towards her.  He denies ever having tried to kill himself.  He rattles off multiple names of antipsychotics and mood stabilizers all of which she says have done him wrong and that he does not like.  The only medicine he likes is either Haldol or Ativan.  Past Medical History:  Past Medical History:  Diagnosis Date  . Asthma   . Back pain   . Paranoia [facility])     Past Surgical History:  Procedure Laterality Date  . CHOLECYSTECTOMY     Family History: History reviewed. No pertinent family history. Family Psychiatric  History: Reports that his father took medication for auditory hallucinations Social History:  Social History   Substance and Sexual Activity  Alcohol Use No     Social History   Substance and Sexual Activity  Drug Use No    Social History   Socioeconomic History  . Marital status: Married    Spouse name: Not on file  . Number of children: Not on file  . Years of education: Not on file  . Highest education level: Not on file  Occupational History  . Not on file  Social Needs  . Financial resource strain: Patient refused  . Food insecurity    Worry: Patient refused    Inability: Patient refused  . Transportation needs    Medical: Patient refused    Non-medical: Patient refused  Tobacco Use  . Smoking status: Never Smoker  . Smokeless tobacco: Never Used  Substance and Sexual Activity  . Alcohol use: No  . Drug use: No  . Sexual activity: Not Currently  Lifestyle  . Physical activity    Days per week: Patient refused    Minutes per session: Patient refused  . Stress: Patient refused  Relationships  . Social Musicianconnections    Talks on phone: Patient refused    Gets together: Patient  refused    Attends religious service: Patient refused    Active member of club or organization: Patient refused    Attends meetings of clubs or organizations: Patient refused    Relationship status: Patient refused  Other Topics Concern  . Not on file  Social History Narrative  . Not on file   Additional Social History:                         Sleep: Good  Appetite:  Good  Current Medications: Current Facility-Administered Medications  Medication Dose Route Frequency Provider Last Rate Last Dose  . acetaminophen (TYLENOL) tablet 650 mg  650 mg Oral Q6H PRN Clapacs, John T, MD      . alum & mag hydroxide-simeth (MAALOX/MYLANTA) 200-200-20 MG/5ML suspension 30 mL  30 mL Oral Q4H PRN Clapacs, John T, MD      . fluticasone (FLONASE) 50 MCG/ACT nasal spray 2 spray  2 spray Each Nare Daily Clapacs, John T, MD   2 spray at 02/14/19 0809  . haloperidol (HALDOL) tablet 5 mg  5 mg Oral BID Clapacs, Jackquline DenmarkJohn T, MD   5 mg at 02/14/19 [id number]  . magnesium hydroxide (MILK OF MAGNESIA) suspension 30 mL  30 mL Oral Daily PRN Clapacs, Jackquline DenmarkJohn T, MD        Lab Results:  Results for orders placed or performed during the hospital encounter of 02/12/19 (from the past 48 hour(s))  Lipid panel     Status: None   Collection Time: 02/14/19  8:24 AM  Result Value Ref Range   Cholesterol 137 0 - 200 mg/dL   Triglycerides 42 <562<150 mg/dL   HDL 56 >13>40 mg/dL   Total CHOL/HDL Ratio 2.4 RATIO   VLDL 8 0 - 40 mg/dL   LDL Cholesterol 73 0 - 99 mg/dL    Comment:        Total Cholesterol/HDL:CHD Risk Coronary Heart Disease Risk Table                     Men   Women  1/2 Average Risk   3.4   3.3  Average Risk       5.0   4.4  2 X Average Risk   9.6   7.1  3 X Average Risk  23.4   11.0        Use the calculated Patient Ratio above and the CHD Risk Table to determine the patient's CHD Risk.        ATP III CLASSIFICATION (LDL):  <100     mg/dL   Optimal  [id number]  mg/dL   Near or Above  Optimal  130-159  mg/dL   Borderline  160-189  mg/dL   High  >190     mg/dL   Very High Performed at [facility], [place], [place] [address]   TSH     Status: None   Collection Time: 02/14/19  8:24 AM  Result Value Ref Range   TSH 2.442 0.350 - 4.500 uIU/mL    Comment: Performed by a 3rd Generation assay with a functional sensitivity of <=0.01 uIU/mL. Performed at [facility], [address] [place], [place] [address]     Blood Alcohol level:  Lab Results  Component Value Date   [facility] <10 02/12/2019   ETH <10 58/52/7782    Metabolic Disorder Labs: No results found for: HGBA1C, MPG No results found for: PROLACTIN Lab Results  Component Value Date   CHOL 137 02/14/2019   TRIG 42 02/14/2019   HDL 56 02/14/2019   CHOLHDL 2.4 02/14/2019   VLDL 8 02/14/2019   LDLCALC 73 02/14/2019    Physical Findings: AIMS:  , ,  ,  ,    CIWA:    COWS:     Musculoskeletal: Strength & Muscle Tone: within normal limits Gait & Station: normal Patient leans: N/A  Psychiatric Specialty Exam: Physical Exam  Nursing note and vitals reviewed. Constitutional: He is oriented to person, place, and time. He appears well-developed and well-nourished.  Respiratory: Effort normal.  Musculoskeletal: Normal range of motion.  Neurological: He is alert and oriented to person, place, and time.  Skin: Skin is warm.    Review of Systems  Constitutional: Negative.   HENT: Negative.   Eyes: Negative.   Respiratory: Negative.   Cardiovascular: Negative.   Gastrointestinal: Negative.   Genitourinary: Negative.   Musculoskeletal: Negative.   Skin: Negative.   Neurological: Negative.   Endo/Heme/Allergies: Negative.   Psychiatric/Behavioral: Positive for hallucinations.       Hyperverbal, delusional thoughts, paranoid persecutory thoughts    Blood pressure (!) 141/95, pulse (!) 53, temperature 98.3 F (36.8 C), temperature source Oral, resp. rate 17, height 6'  (1.829 m), weight 127 kg, SpO2 100 %.Body mass index is 37.97 kg/m.  General Appearance: Casual  Eye Contact:  Good  Speech:  Pressured  Volume:  Increased  Mood:  Euthymic  Affect:  Congruent  Thought Process:  Irrelevant and Descriptions of Associations: Tangential and loose  Orientation:  Full (Time, Place, and Person)  Thought Content:  Delusions, Hallucinations: Auditory, Ideas of Reference:   Paranoia Delusions, Paranoid Ideation and Tangential  Suicidal Thoughts:  No  Homicidal Thoughts:  No  Memory:  Immediate;   Good Recent;   Good Remote;   Good  Judgement:  Impaired  Insight:  Fair  Psychomotor Activity:  Normal  Concentration:  Concentration: Fair  Recall:  Good  Fund of Knowledge:  Poor  Language:  Good  Akathisia:  No  Handed:  Right  AIMS (if indicated):     Assets:  Desire for Improvement Financial Resources/Insurance Resilience  ADL's:  Intact  Cognition:  WNL  Sleep:  Number of Hours: 6.75   Assessment: Patient presents in the day room and is interacting with peers appropriately at this time.  Patient is pleasant, calm, and cooperative.  Patient has hyperverbal speech and is tangential in his conversation.  Patient jumps from topic to topic very quickly and has loose associations in regards to medications and taking his manhood or try to turn him into a "faggot" so that people will kill  him.  He still has these paranoid delusional thoughts of a group of people trying to kill military people or veterans and that he is a Teaching laboratory technician and that he knows they are going to try to kill him to.  Patient does report that he was on Haldol 10 mg p.o. twice daily and that is the only medication that he will continue to take.  Treatment Plan Summary: Daily contact with patient to assess and evaluate symptoms and progress in treatment and Medication management  Increase Haldol 10 mg p.o. nightly for psychotic symptoms Continue Haldol 5 mg p.o. daily for psychotic  symptoms Encourage group therapy participation Continue every 15 minute safety checks  Maryfrances Bunnell, FNP 02/14/2019, 11:32 AM

## 2019-02-14 NOTE — Progress Notes (Signed)
Patient came to staff stated that he threw up and need a ginger ale.When staff looked at his tray it looks and smells like vomitus.Gave patient a  ginger ale.Clearly told patient that if he is nauseated again he can have medicines and no ginger ale whenever he feels like drinking fluids.Patient left the tray on the table,yelled at staff and walked off states "you all are put poison in the tray.I am not going to get the tray."After few minutes patient came back and took the tray.

## 2019-02-14 NOTE — BHH Counselor (Signed)
CSW spoke with Manuela Schwartz (group home worker [phone]) who reports the [facility] manager(Marcia) says the pt is not allowed to return to the home due to safety concerns for the other residents. Manuela Schwartz says a referral has been made to [facility] in [place](referral faxed to [place], social worker-did not know phone number) but is unsure if he has been accepted.  CSW spoke with Tomi Bamberger [phone]) who reports referrals have been made to [facility], she says he is unsure if he has been accepted at either. Tomi Bamberger says "he is not suited for a [facility] setting" due to him leaving the premises without permission, threatening staff, etc.  In addition she says she is working on getting his medicaid reinstated, reports medicaid application was submitted on 10/15. Tomi Bamberger states the pt put in his 2 week notice and says the facility "is not city like." CSW spoke with Musician) at [employer] [facility]) who says Tanya pollard in the [facility] ED did not send the referral. She states she cannot make a bed offer because the pt does not have medicaid.

## 2019-02-14 NOTE — Progress Notes (Signed)
Recreation Therapy Notes  Date:02/14/2019  Time: 9:30 am  Location: Craft room  Behavioral response: Appropriate   Intervention Topic: Communication  Discussion/Intervention:  Group content today was focused on communication. The group defined communication and ways to communicate with others. Individuals stated reason why communication is important and some reasons to communicate with others. Patients expressed if they thought they were good at communicating with others and ways they could improve their communication skills. The group identified important parts of communication and some experiences they have had in the past with communication. The group participated in the intervention "Words in a Bag", where they had a chance to test out their communication skills and identify ways to improve their communication techniques.  Clinical Observations/Feedback:  Patient came to group late due to unknown reasons. He stated that communication is needed to survive. Individual was social with peers and staff while participating in group.  Laurajean Hosek LRT/CTRS         Ashleen Demma 02/14/2019 11:12 AM

## 2019-02-15 DIAGNOSIS — F203 Undifferentiated schizophrenia: Secondary | ICD-10-CM | POA: Diagnosis not present

## 2019-02-15 MED ORDER — OLANZAPINE 5 MG PO TBDP
10.0000 mg | ORAL_TABLET | Freq: Three times a day (TID) | ORAL | Status: DC | PRN
  Administered 2019-02-19: 10 mg via ORAL
  Filled 2019-02-15 (×3): qty 2

## 2019-02-15 MED ORDER — HALOPERIDOL DECANOATE 100 MG/ML IM SOLN
100.0000 mg | Freq: Once | INTRAMUSCULAR | Status: DC
  Filled 2019-02-15: qty 1

## 2019-02-15 MED ORDER — ZIPRASIDONE MESYLATE 20 MG IM SOLR
20.0000 mg | INTRAMUSCULAR | Status: DC | PRN
  Filled 2019-02-15 (×2): qty 20

## 2019-02-15 MED ORDER — HALOPERIDOL 5 MG PO TABS
10.0000 mg | ORAL_TABLET | Freq: Every day | ORAL | Status: DC
  Administered 2019-02-16 – 2019-02-17 (×2): 10 mg via ORAL
  Filled 2019-02-15 (×2): qty 2

## 2019-02-15 MED ORDER — ZIPRASIDONE MESYLATE 20 MG IM SOLR: 20.0000 mg | Freq: Four times a day (QID) | INTRAMUSCULAR | Status: DC | PRN

## 2019-02-15 MED ORDER — LORAZEPAM 1 MG PO TABS
1.0000 mg | ORAL_TABLET | ORAL | Status: AC | PRN
  Administered 2019-02-17: 22:00:00 1 mg via ORAL
  Filled 2019-02-15: qty 1

## 2019-02-15 NOTE — Progress Notes (Signed)
Care takened over at 0100, patient had door barricaded but staff was able to get the door open.  Patient remained in room most of the night. No issues to report on shift.

## 2019-02-15 NOTE — Progress Notes (Signed)
[facility] MD Progress Note  02/15/2019 12:18 PM Eddie Holland  MRN:  [id number] Subjective: Patient is a 58 year old male with a probable past psychiatric history significant for schizophrenia versus schizoaffective disorder who was admitted on 02/13/2019 after being sent from a group home.  He was noted to be belligerent and argumentative.  He refused to take his medications.  Objective: Patient is seen and examined.  Patient is a 58 year old male with the above-stated past psychiatric history seen in follow-up.  The patient does not really answer questions and continues to talk about how his family is plotting against him.  That he does not have anything wrong with him, and that he needs to return to [place].  He refuses to take any medications outside of Haldol.  He refuses to take any increased doses of Haldol.  He was apparently at the [facility] hospital for approximately 2 months, and then had been transferred to the group home for a week.  There were no records in the electronic medical record.  He admitted to auditory hallucinations as well as paranoia.  He remains irritable, intrusive, and at times threatening.  His current medications include Haldol 5 mg p.o. daily and 10 mg p.o. nightly.  Review of his laboratories showed essentially normal electrolytes, normal lipid panel, normal CBC.  He apparently was on Depakote because his Depakote level is 82.  TSH was normal.  Drug screen was negative.  Despite the fact that he had Depakote in his system he alleges that he had an allergy to Depakote.  He apparently stated that he was taking Depakote, but he "did not like the way it made me feel".  He also told the folks in the emergency room that he had previously been on the long-acting Haldol injection as well as the pills.  He is paranoid, delusional, agitated.  His blood pressure is elevated this morning at 125/100, he is tachycardic with a rate of 152.  He is afebrile.  He slept 5.5 hours last night.  Principal  Problem: Schizophrenia (HCC) Diagnosis: Principal Problem:   Schizophrenia (HCC)  Total Time spent with patient: 30 minutes  Past Psychiatric History: See admission H&P  Past Medical History:  Past Medical History:  Diagnosis Date  . Asthma   . Back pain   . Paranoia [facility])     Past Surgical History:  Procedure Laterality Date  . CHOLECYSTECTOMY     Family History: History reviewed. No pertinent family history. Family Psychiatric  History: See admission H&P Social History:  Social History   Substance and Sexual Activity  Alcohol Use No     Social History   Substance and Sexual Activity  Drug Use No    Social History   Socioeconomic History  . Marital status: Married    Spouse name: Not on file  . Number of children: Not on file  . Years of education: Not on file  . Highest education level: Not on file  Occupational History  . Not on file  Social Needs  . Financial resource strain: Patient refused  . Food insecurity    Worry: Patient refused    Inability: Patient refused  . Transportation needs    Medical: Patient refused    Non-medical: Patient refused  Tobacco Use  . Smoking status: Never Smoker  . Smokeless tobacco: Never Used  Substance and Sexual Activity  . Alcohol use: No  . Drug use: No  . Sexual activity: Not Currently  Lifestyle  . Physical activity  Days per week: Patient refused    Minutes per session: Patient refused  . Stress: Patient refused  Relationships  . Social Musicianconnections    Talks on phone: Patient refused    Gets together: Patient refused    Attends religious service: Patient refused    Active member of club or organization: Patient refused    Attends meetings of clubs or organizations: Patient refused    Relationship status: Patient refused  Other Topics Concern  . Not on file  Social History Narrative  . Not on file   Additional Social History:                         Sleep: Fair  Appetite:   Fair  Current Medications: Current Facility-Administered Medications  Medication Dose Route Frequency Provider Last Rate Last Dose  . acetaminophen (TYLENOL) tablet 650 mg  650 mg Oral Q6H PRN Clapacs, John T, MD      . alum & mag hydroxide-simeth (MAALOX/MYLANTA) 200-200-20 MG/5ML suspension 30 mL  30 mL Oral Q4H PRN Clapacs, John T, MD      . fluticasone (FLONASE) 50 MCG/ACT nasal spray 2 spray  2 spray Each Nare Daily Clapacs, John T, MD   2 spray at 02/14/19 0809  . haloperidol (HALDOL) tablet 10 mg  10 mg Oral QHS Money, Travis B, FNP   10 mg at 02/14/19 2101  . haloperidol (HALDOL) tablet 5 mg  5 mg Oral Daily Money, Gerlene Burdockravis B, FNP   5 mg at 02/15/19 0844  . hydrOXYzine (ATARAX/VISTARIL) tablet 50 mg  50 mg Oral TID PRN Money, Gerlene Burdockravis B, FNP   50 mg at 02/15/19 0033  . magnesium hydroxide (MILK OF MAGNESIA) suspension 30 mL  30 mL Oral Daily PRN Clapacs, John T, MD      . naproxen (NAPROSYN) tablet 500 mg  500 mg Oral BID WC Money, Gerlene Burdockravis B, FNP      . ziprasidone (GEODON) injection 20 mg  20 mg Intramuscular Q6H PRN Antonieta Pertlary, Katina Remick Lawson, MD        Lab Results:  Results for orders placed or performed during the hospital encounter of 02/12/19 (from the past 48 hour(s))  Lipid panel     Status: None   Collection Time: 02/14/19  8:24 AM  Result Value Ref Range   Cholesterol 137 0 - 200 mg/dL   Triglycerides 42 <130<150 mg/dL   HDL 56 >86>40 mg/dL   Total CHOL/HDL Ratio 2.4 RATIO   VLDL 8 0 - 40 mg/dL   LDL Cholesterol 73 0 - 99 mg/dL    Comment:        Total Cholesterol/HDL:CHD Risk Coronary Heart Disease Risk Table                     Men   Women  1/2 Average Risk   3.4   3.3  Average Risk       5.0   4.4  2 X Average Risk   9.6   7.1  3 X Average Risk  23.4   11.0        Use the calculated Patient Ratio above and the CHD Risk Table to determine the patient's CHD Risk.        ATP III CLASSIFICATION (LDL):  <100     mg/dL   Optimal  [id number]  mg/dL   Near or Above  Optimal  130-159  mg/dL   Borderline  160-189  mg/dL   High  >190     mg/dL   Very High Performed at [facility], [place], [place] [address]   TSH     Status: None   Collection Time: 02/14/19  8:24 AM  Result Value Ref Range   TSH 2.442 0.350 - 4.500 uIU/mL    Comment: Performed by a 3rd Generation assay with a functional sensitivity of <=0.01 uIU/mL. Performed at [facility], [address] [place], [place] [address]     Blood Alcohol level:  Lab Results  Component Value Date   [facility] <10 02/12/2019   ETH <10 16/96/7893    Metabolic Disorder Labs: No results found for: HGBA1C, MPG No results found for: PROLACTIN Lab Results  Component Value Date   CHOL 137 02/14/2019   TRIG 42 02/14/2019   HDL 56 02/14/2019   CHOLHDL 2.4 02/14/2019   VLDL 8 02/14/2019   LDLCALC 73 02/14/2019    Physical Findings: AIMS:  , ,  ,  ,    CIWA:    COWS:     Musculoskeletal: Strength & Muscle Tone: within normal limits Gait & Station: normal Patient leans: N/A  Psychiatric Specialty Exam: Physical Exam  Nursing note and vitals reviewed. Constitutional: He appears well-developed and well-nourished.  HENT:  Head: Normocephalic and atraumatic.  Respiratory: Effort normal.  Neurological: He is alert.    ROS  Blood pressure (!) 125/100, pulse (!) 152, temperature 97.9 F (36.6 C), temperature source Oral, resp. rate 18, height 6' (1.829 m), weight 127 kg, SpO2 98 %.Body mass index is 37.97 kg/m.  General Appearance: Casual  Eye Contact:  Minimal  Speech:  Pressured  Volume:  Increased  Mood:  Dysphoric and Irritable  Affect:  Labile  Thought Process:  Goal Directed and Descriptions of Associations: Loose  Orientation:  Full (Time, Place, and Person)  Thought Content:  Delusions and Hallucinations: Auditory  Suicidal Thoughts:  No  Homicidal Thoughts:  No  Memory:  Immediate;   Poor Recent;   Poor Remote;   Poor  Judgement:  Impaired   Insight:  Lacking  Psychomotor Activity:  Increased  Concentration:  Concentration: Poor and Attention Span: Poor  Recall:  Poor  Fund of Knowledge:  Poor  Language:  Good  Akathisia:  Negative  Handed:  Right  AIMS (if indicated):     Assets:  Desire for Improvement Resilience  ADL's:  Impaired  Cognition:  WNL  Sleep:  Number of Hours: 5.5     Treatment Plan Summary: Daily contact with patient to assess and evaluate symptoms and progress in treatment, Medication management and Plan : Patient is seen and examined.  Patient is a 58 year old male with the above-stated past psychiatric history who is seen in follow-up.   Diagnosis: #1 schizophrenia versus schizoaffective disorder  Patient is seen in follow-up.  He remains significantly ill.  He is already told me that he will not take an increased dose of Haldol, but I am going to go on and try and increase it today.  We will increase it to 10 mg p.o. twice daily.  I am also going to write for a long-acting Haldol injection.  We will give him 100 mg IM today.  He has refused to take Depakote at this point, his Depakote level on admission was 82.  Hopefully the long-acting injection and the increase in the dosage he will be willing to take  on will improve his situation currently.  His blood pressure is elevated and he is tachycardic.  He has not had an EKG obtained as of yet, and I will order one, but I seriously doubt he will be willing to allow [place] to do that. 1.  Increase haloperidol to 10 mg p.o. twice daily.  This is for psychosis. 2.  Give long-acting Haldol injection 100 mg IM x1.  This is for psychosis. 3.  Add Geodon 20 mg IM every 6 hours as needed agitation. 4.  Continue Naprosyn 500 mg p.o. twice daily with food for osteoarthritis. 5.  Add Protonix 40 mg p.o. daily for gastric protection given the fact he is on the nonsteroidal anti-inflammatory medications. 6.  Disposition planning-in progress.  Antonieta Pert,  MD 02/15/2019, 12:18 PM

## 2019-02-15 NOTE — BHH Group Notes (Signed)
[facility] LCSW Group Therapy Note  Date/Time: 02/15/2019 @ 1:00pm  Type of Therapy/Topic:  Group Therapy:  Feelings about Diagnosis  Participation Level:  Active   Mood: Pleasant   Description of Group:    This group will allow patients to explore their thoughts and feelings about diagnoses they have received. Patients will be guided to explore their level of understanding and acceptance of these diagnoses. Facilitator will encourage patients to process their thoughts and feelings about the reactions of others to their diagnosis, and will guide patients in identifying ways to discuss their diagnosis with significant others in their lives. This group will be process-oriented, with patients participating in exploration of their own experiences as well as giving and receiving support and challenge from other group members.   Therapeutic Goals: 1. Patient will demonstrate understanding of diagnosis as evidence by identifying two or more symptoms of the disorder:  2. Patient will be able to express two feelings regarding the diagnosis 3. Patient will demonstrate ability to communicate their needs through discussion and/or role plays  Summary of Patient Progress:   Patient was active and engaged throughout group therapy today. Patient discussed how he feels his mental diagnosis came from the environment that he grew up in. Patient discusses his childhood and opened up about his mental health history in relation to treatment. Patient discussed how medications are used to treat mental health diagnoses and discussed what works for his mental health diagnosis. Patient discussed that a positive of having a mental health diagnosis is "better to hear something than nothing" in regards to hallucinations.      Therapeutic Modalities:   Cognitive Behavioral Therapy Brief Therapy Feelings Identification   Ardelle Anton, LCSW

## 2019-02-15 NOTE — Plan of Care (Signed)
Patient denies SI/HI/AVH. Reports depression 0/10, Anxiety 0/10. Patient goal for the day is "body- exercise." patient is often times found at nurses station with demands. Refuses multiple medications. Patient's safety is maintained on the unit.    Problem: Education: Goal: Knowledge of [facility] Education information/materials will improve Outcome: Not Progressing Goal: Emotional status will improve Outcome: Not Progressing Goal: Mental status will improve Outcome: Not Progressing

## 2019-02-15 NOTE — Plan of Care (Signed)
Visible in the milieu. Frequently coming to the nurses station to request services. Alert and oriented and denying thoughts of self harm No sign of distress noted. Safety precautions maintained on the unit.

## 2019-02-16 DIAGNOSIS — F203 Undifferentiated schizophrenia: Secondary | ICD-10-CM | POA: Diagnosis not present

## 2019-02-16 MED ORDER — HALOPERIDOL 5 MG PO TABS
15.0000 mg | ORAL_TABLET | Freq: Every day | ORAL | Status: DC
  Administered 2019-02-16: 15 mg via ORAL
  Filled 2019-02-16 (×2): qty 3

## 2019-02-16 MED ORDER — TRAZODONE HCL 100 MG PO TABS
100.0000 mg | ORAL_TABLET | Freq: Every evening | ORAL | Status: DC | PRN
  Administered 2019-02-17 – 2019-03-04 (×6): 100 mg via ORAL
  Filled 2019-02-16 (×9): qty 1

## 2019-02-16 NOTE — Plan of Care (Signed)
D- Patient alert and oriented. Patient presents in a hyperactive/preoccupied, but pleasant mood on assessment stating that he didn't sleep well last night. Patient stated that he feels as if "people be coming in your room trying to do shit to you. Somebody tried to stick me in my feet". Patient had complaints of leg pain, rating his pain level an "8/10", in which he only received his scheduled medication, he did not request anything more from this Probation officer. Patient also endorsed AH, reporting that he sees/hears "a lot of smart and dumb stuff". Patient did not go into anymore detail for this Probation officer. Patient denies SI, HI, VH at this time. Patient also denies any signs/symptoms of depression, however, he stated that he's always anxious, "I'm hyper and a high strung person". Patient's goal for today is "exercise", in which he will "try something new" in order to achieve his goal. Patient also reported to this writer that "I wish I could be with my family".  A- Scheduled medications administered to patient, per MD orders. Support and encouragement provided.  Routine safety checks conducted every 15 minutes.  Patient informed to notify staff with problems or concerns.  R- No adverse drug reactions noted. Patient contracts for safety at this time. Patient compliant with medications and treatment plan. Patient receptive, calm, and cooperative. Patient interacts well with others on the unit.  Patient remains safe at this time.  Problem: Education: Goal: Knowledge of  General Education information/materials will improve Outcome: Not Progressing Goal: Emotional status will improve Outcome: Not Progressing Goal: Mental status will improve Outcome: Not Progressing Goal: Verbalization of understanding the information provided will improve Outcome: Not Progressing   Problem: Activity: Goal: Interest or engagement in activities will improve Outcome: Not Progressing Goal: Sleeping patterns will  improve Outcome: Not Progressing   Problem: Coping: Goal: Ability to verbalize frustrations and anger appropriately will improve Outcome: Not Progressing Goal: Ability to demonstrate self-control will improve Outcome: Not Progressing   Problem: Health Behavior/Discharge Planning: Goal: Identification of resources available to assist in meeting health care needs will improve Outcome: Not Progressing Goal: Compliance with treatment plan for underlying cause of condition will improve Outcome: Not Progressing   Problem: Physical Regulation: Goal: Ability to maintain clinical measurements within normal limits will improve Outcome: Not Progressing   Problem: Safety: Goal: Periods of time without injury will increase Outcome: Not Progressing

## 2019-02-16 NOTE — Progress Notes (Signed)
Care takened over at 2300, patient had door barricaded but staff was able to get the door open without incidence, he got up about 0100 yelling at staff and security that we were stciking him in his feet with needles, security called and redirection given, patient able to calm down, patient requested medication similar to benadryl , vistaril given but patient end up spitting medication out onto the floor and going to dayroom to sit for the rest of shift.

## 2019-02-16 NOTE — Progress Notes (Signed)
[facility] MD Progress Note  02/16/2019 11:56 AM Eddie Holland  MRN:  [id number] Subjective:  Patient is a 58 year old male with a probable past psychiatric history significant for schizophrenia versus schizoaffective disorder who was admitted on 02/13/2019 after being sent from a group home.  He was noted to be belligerent and argumentative.  He refused to take his medications.  Objective: Patient is seen and examined.  Patient is a 58 year old male with the above-stated past psychiatric history who is seen in follow-up.  He is basically unchanged this morning.  He is still very intrusive.  He barricaded himself in the room again last night, but let everyone in.  He did take the increase oral Haldol dose yesterday, but apparently refused the long-acting Haldol injection.  He continues to knock on the door and be intrusive.  He has limited insight.  His vital signs are stable, he is afebrile.  Nursing notes reflect he only slept 1.25 hours last night.  No new laboratories.  Principal Problem: Schizophrenia (HCC) Diagnosis: Principal Problem:   Schizophrenia (HCC)  Total Time spent with patient: 15 minutes  Past Psychiatric History: See admission H&P  Past Medical History:  Past Medical History:  Diagnosis Date  . Asthma   . Back pain   . Paranoia [facility])     Past Surgical History:  Procedure Laterality Date  . CHOLECYSTECTOMY     Family History: History reviewed. No pertinent family history. Family Psychiatric  History: See admission H&P Social History:  Social History   Substance and Sexual Activity  Alcohol Use No     Social History   Substance and Sexual Activity  Drug Use No    Social History   Socioeconomic History  . Marital status: Married    Spouse name: Not on file  . Number of children: Not on file  . Years of education: Not on file  . Highest education level: Not on file  Occupational History  . Not on file  Social Needs  . Financial resource strain: Patient  refused  . Food insecurity    Worry: Patient refused    Inability: Patient refused  . Transportation needs    Medical: Patient refused    Non-medical: Patient refused  Tobacco Use  . Smoking status: Never Smoker  . Smokeless tobacco: Never Used  Substance and Sexual Activity  . Alcohol use: No  . Drug use: No  . Sexual activity: Not Currently  Lifestyle  . Physical activity    Days per week: Patient refused    Minutes per session: Patient refused  . Stress: Patient refused  Relationships  . Social Musician on phone: Patient refused    Gets together: Patient refused    Attends religious service: Patient refused    Active member of club or organization: Patient refused    Attends meetings of clubs or organizations: Patient refused    Relationship status: Patient refused  Other Topics Concern  . Not on file  Social History Narrative  . Not on file   Additional Social History:                         Sleep: Poor  Appetite:  Fair  Current Medications: Current Facility-Administered Medications  Medication Dose Route Frequency Provider Last Rate Last Dose  . acetaminophen (TYLENOL) tablet 650 mg  650 mg Oral Q6H PRN Clapacs, Jackquline Denmark, MD      . alum & mag hydroxide-simeth (MAALOX/MYLANTA) 200-200-20  MG/5ML suspension 30 mL  30 mL Oral Q4H PRN Clapacs, John T, MD      . fluticasone (FLONASE) 50 MCG/ACT nasal spray 2 spray  2 spray Each Nare Daily Clapacs, John T, MD   2 spray at 02/16/19 0830  . haloperidol (HALDOL) tablet 10 mg  10 mg Oral QHS Money, Travis B, FNP   10 mg at 02/15/19 2112  . haloperidol (HALDOL) tablet 10 mg  10 mg Oral Daily Sharma Covert, MD   10 mg at 02/16/19 0830  . haloperidol decanoate (HALDOL DECANOATE) 100 MG/ML injection 100 mg  100 mg Intramuscular Once Sharma Covert, MD      . hydrOXYzine (ATARAX/VISTARIL) tablet 50 mg  50 mg Oral TID PRN Money, Lowry Ram, FNP   50 mg at 02/15/19 2112  . OLANZapine zydis (ZYPREXA)  disintegrating tablet 10 mg  10 mg Oral Q8H PRN Sharma Covert, MD       And  . LORazepam (ATIVAN) tablet 1 mg  1 mg Oral PRN Sharma Covert, MD       And  . ziprasidone (GEODON) injection 20 mg  20 mg Intramuscular PRN Sharma Covert, MD      . magnesium hydroxide (MILK OF MAGNESIA) suspension 30 mL  30 mL Oral Daily PRN Clapacs, John T, MD      . naproxen (NAPROSYN) tablet 500 mg  500 mg Oral BID WC Money, Darnelle Maffucci B, FNP   500 mg at 02/16/19 0830    Lab Results: No results found for this or any previous visit (from the past 55 hour(s)).  Blood Alcohol level:  Lab Results  Component Value Date   ETH <10 02/12/2019   ETH <10 33/29/5188    Metabolic Disorder Labs: No results found for: HGBA1C, MPG No results found for: PROLACTIN Lab Results  Component Value Date   CHOL 137 02/14/2019   TRIG 42 02/14/2019   HDL 56 02/14/2019   CHOLHDL 2.4 02/14/2019   VLDL 8 02/14/2019   LDLCALC 73 02/14/2019    Physical Findings: AIMS:  , ,  ,  ,    CIWA:    COWS:     Musculoskeletal: Strength & Muscle Tone: within normal limits Gait & Station: normal Patient leans: N/A  Psychiatric Specialty Exam: Physical Exam  Nursing note and vitals reviewed. Constitutional: He appears well-developed and well-nourished.  HENT:  Head: Normocephalic and atraumatic.  Respiratory: Effort normal.  Neurological: He is alert.    ROS  Blood pressure 107/61, pulse 87, temperature 98.1 F (36.7 C), temperature source Oral, resp. rate 18, height 6' (1.829 m), weight 127 kg, SpO2 96 %.Body mass index is 37.97 kg/m.  General Appearance: Disheveled  Eye Contact:  Fair  Speech:  Pressured  Volume:  Increased  Mood:  Anxious, Dysphoric and Irritable  Affect:  Congruent  Thought Process:  Goal Directed and Descriptions of Associations: Loose  Orientation:  Negative  Thought Content:  Delusions, Paranoid Ideation and Rumination  Suicidal Thoughts:  No  Homicidal Thoughts:  No  Memory:   Immediate;   Poor Recent;   Poor Remote;   Poor  Judgement:  Impaired  Insight:  Lacking  Psychomotor Activity:  Increased  Concentration:  Concentration: Fair and Attention Span: Fair  Recall:  Poor  Fund of Knowledge:  Fair  Language:  Good  Akathisia:  Negative  Handed:  Right  AIMS (if indicated):     Assets:  Desire for Improvement Resilience  ADL's:  Intact  Cognition:  WNL  Sleep:  Number of Hours: 1.25     Treatment Plan Summary: Daily contact with patient to assess and evaluate symptoms and progress in treatment, Medication management and Plan : Patient is seen and examined.  Patient is a 58 year old male with the above-stated past psychiatric history who is seen in follow-up.   Diagnosis: #1 schizophrenia versus schizoaffective disorder  Patient is seen in follow-up.  He remains significantly ill.  He also maintains a total lack of insight and judgment.  His Haldol was increased orally yesterday, and he took to 10 mg twice daily.  He did refuse the long-acting Haldol injection, and have asked the nurses to attempt to give it to him again.  Hopefully he will take that.  I am going to increase his bedtime Haldol to 15 mg p.o. nightly to see if we can get him some sleep.  I am also going to add trazodone 100 mg p.o. nightly as needed insomnia.  They attempted to give him some hydroxyzine yesterday, but he did spit the pills out.  I suspect that we will have to do forced medications on him, and I would support Dr. Toni Amendlapacs in writing the forced medication orders.  I also suspect that the patient will have to [facility] given [facility], his return to the psychiatric unit after just 1 week in the group home, and his lack of insight and compliance with medications.  .  Increase haloperidol to 15 mg p.o. nightly and 10 mg p.o. daily.  Twice daily.  This is for psychosis. 2.    Attempt  to give long-acting Haldol injection 100 mg IM x1.  This is for psychosis. 3.    Continue Geodon 20 mg IM every 6 hours as needed agitation. 4.  Continue Naprosyn 500 mg p.o. twice daily with food for osteoarthritis. 5.    Continue Protonix 40 mg p.o. daily for gastric protection given the fact he is on the nonsteroidal anti-inflammatory medications. 6.    Add trazodone 100 mg p.o. nightly as needed insomnia. 7.  Add lorazepam 1 mg p.o. every 6 hours as needed agitation. 8.  Disposition planning-in progress, but I anticipate forced medications as well as referral [facility]..  Antonieta PertGreg Lawson , MD 02/16/2019, 11:56 AM

## 2019-02-16 NOTE — BHH Group Notes (Signed)
LCSW Group Therapy Note 02/16/2019 1:15pm  Type of Therapy and Topic: Group Therapy: Feelings Around Returning Home & Establishing a Supportive Framework and Supporting Oneself When Supports Not Available  Participation Level: Active  Description of Group:  Patients first processed thoughts and feelings about upcoming discharge. These included fears of upcoming changes, lack of change, new living environments, judgements and expectations from others and overall stigma of mental health issues. The group then discussed the definition of a supportive framework, what that looks and feels like, and how do to discern it from an unhealthy non-supportive network. The group identified different types of supports as well as what to do when your family/friends are less than helpful or unavailable  Therapeutic Goals  1. Patient will identify one healthy supportive network that they can use at discharge. 2. Patient will identify one factor of a supportive framework and how to tell it from an unhealthy network. 3. Patient able to identify one coping skill to use when they do not have positive supports from others. 4. Patient will demonstrate ability to communicate their needs through discussion and/or role plays.  Summary of Patient Progress:  The patient reported he feels happy today. Pt engaged during group session. As patients processed their anxiety about discharge and described healthy supports patient shared he is not ready to be discharge.  Patients identified at least one self-care tool they were willing to use after discharge.   Therapeutic Modalities Cognitive Behavioral Therapy Motivational Interviewing   Sharley Keeler  CUEBAS-COLON, LCSW 02/16/2019 12:13 PM

## 2019-02-17 DIAGNOSIS — F203 Undifferentiated schizophrenia: Secondary | ICD-10-CM | POA: Diagnosis not present

## 2019-02-17 MED ORDER — TUBERCULIN PPD 5 UNIT/0.1ML ID SOLN
5.0000 [IU] | Freq: Once | INTRADERMAL | Status: AC
  Administered 2019-02-17: 5 [IU] via INTRADERMAL
  Filled 2019-02-17: qty 0.1

## 2019-02-17 MED ORDER — HALOPERIDOL DECANOATE 100 MG/ML IM SOLN: 200.0000 mg | INTRAMUSCULAR | Status: DC

## 2019-02-17 MED ORDER — HALOPERIDOL DECANOATE 100 MG/ML IM SOLN
200.0000 mg | INTRAMUSCULAR | Status: DC
  Administered 2019-02-17: 200 mg via INTRAMUSCULAR
  Filled 2019-02-17: qty 2

## 2019-02-17 NOTE — Progress Notes (Signed)
Patient just came up to the nurse's station stating "who do I need to see about talking to my counselor or social worker, I told them yesterday I wanted to talk to them and they said I would see them today". Patient also stated "people in this state are trying to make me a faggot, making me have titties, like I'm a punk. I like women, I like fucking women".

## 2019-02-17 NOTE — Plan of Care (Signed)
D- Patient alert and oriented. Patient presents in a pleasant mood on assessment stating that he "got a little nap", when asked about how he slept last night. Patient, did however, get some rest in for a couple of hours after lunch. Patient continues to endorse AH, stating that "it's not bad, it's good", in regards to what he's hearing. Patient denies any signs/symptoms of depression/anxiety. Patient also denies SI, HI, VH, and pain at this time. Patient had no stated goals when speaking with this writer, however, it appears that he put "spirituality" on his self-inventory.  A- Scheduled medications administered to patient, per MD orders. Support and encouragement provided.  Routine safety checks conducted every 15 minutes.  Patient informed to notify staff with problems or concerns.  R- No adverse drug reactions noted. Patient contracts for safety at this time. Patient compliant with medications and treatment plan. Patient receptive, calm, and cooperative. Patient interacts well with others on the unit.  Patient remains safe at this time.  Problem: Education: Goal: Knowledge of [facility] Education information/materials will improve Outcome: Progressing Goal: Emotional status will improve Outcome: Progressing Goal: Mental status will improve Outcome: Progressing Goal: Verbalization of understanding the information provided will improve Outcome: Progressing   Problem: Activity: Goal: Interest or engagement in activities will improve Outcome: Progressing Goal: Sleeping patterns will improve Outcome: Progressing   Problem: Coping: Goal: Ability to verbalize frustrations and anger appropriately will improve Outcome: Progressing Goal: Ability to demonstrate self-control will improve Outcome: Progressing   Problem: Health Behavior/Discharge Planning: Goal: Identification of resources available to assist in meeting health care needs will improve Outcome: Progressing Goal: Compliance  with treatment plan for underlying cause of condition will improve Outcome: Progressing   Problem: Physical Regulation: Goal: Ability to maintain clinical measurements within normal limits will improve Outcome: Progressing   Problem: Safety: Goal: Periods of time without injury will increase Outcome: Progressing

## 2019-02-17 NOTE — Progress Notes (Signed)
Recreation Therapy Notes  INPATIENT RECREATION THERAPY ASSESSMENT  Patient Details Name: Eddie Holland MRN: [id number] DOB: 1960-07-09 Today's Date: 02/17/2019       Information Obtained From: Patient  Able to Participate in Assessment/Interview: Yes  Patient Presentation: Responsive  Reason for Admission (Per Patient): Active Symptoms  Patient Stressors:    Coping Skills:   Talk  Leisure Interests (2+):  Social - Friends  Frequency of Recreation/Participation: Biomedical engineer of Community Resources:     [employer]:     Current Use:    If no, Barriers?:    Expressed Interest in [employer] Information:    [employer] of Residence:  Insurance underwriter  Patient Main Form of Transportation: Diplomatic Services operational officer  Patient Strengths:  helping people; being positive  Patient Identified Areas of Improvement:  getting back to [place]  Patient Goal for Hospitalization:  To go back to my state, my home  Current SI (including self-harm):  No  Current HI:  No  Current AVH: No  Staff Intervention Plan: Group Attendance, Collaborate with Interdisciplinary Treatment Team  Consent to Intern Participation: N/A  Annasophia Crocker 02/17/2019, 12:25 PM

## 2019-02-17 NOTE — Progress Notes (Signed)
D - Patient was in the day room upon arrival to the unit. Patient was pleasant during assessment denying SI/HI, pain, anxiety and depression with this Probation officer. Patient endorses AVH, stating, "I always hear voices but they are getting better since I have been here."   Patient observed being loud in the day room and patient given education and redirection. Patient was compliant for a while but had a few more outbursts. Patient was redirected to his room and given PRN medication, (See MAR). Patient was given another chance to go the the day room after medications were administered. Patient didn't stay long and went back to his room.   Patient came up to the nurses station several times this evening stating, "I want to discharge tonight, who do I talk to about that?" Patient given education. Patient came up stating, "I want to stay with Mrs. Hope, can I call her to see if I can come back and stay with her?" Patient given education.   R - Patient being monitored Q 15 minutes for safety per unit protocol. Patient remains safe on the unit.

## 2019-02-17 NOTE — Progress Notes (Signed)
Recreation Therapy Notes  Date:02/17/2019  Time: 9:30 am  Location: Craft room  Behavioral response: Appropriate   Intervention Topic: Relaxation  Discussion/Intervention:  Group content today was focused on relaxation. The group defined relaxation and identified healthy ways to relax. Individuals expressed how much time they spend relaxing. Patients expressed how much their life would be if they did not make time for themselves to relax. The group stated ways they could improve their relaxation techniques in the future.  Individuals participated in the intervention "Time to Relax" where they had a chance to experience different relaxation techniques.  Clinical Observations/Feedback:  Patient came to group late due to unknown reasons. He left group early stating "I can not think straight." Dace Denn LRT/CTRS         Ura Yingling 02/17/2019 12:04 PM

## 2019-02-17 NOTE — BHH Counselor (Signed)
CSW supported the patient in Eddie Holland, [phone], [phone] to speak with Josepha Pigg about bed.   CSW observed the patient to have his phone interview.  CSW followed up with Josepha Pigg following the call and was informed that "he is too concerned with his old stuff, that he used to do and he accused the previous group home of neglect".  She reports that he will NOT be a good fit for her group home.   Assunta Curtis, MSW, LCSW 02/17/2019 11:31 AM

## 2019-02-17 NOTE — Progress Notes (Addendum)
[facility] MD Progress Note  02/17/2019 1:15 PM Eddie Holland  MRN:  [id number]   Subjective:  Patient seen in person for follow-up for this patient with schizophrenia.  Patient reports today that things are OK but he continues to only want to receive haldol.  Patient states that he is willing to take a haldol injection as long as it is only Haldol.  Patient reports no complaints or concerns at this time.  He reports eating well, but not sleeping well. Patient denies any suicidal or homicidal ideations, but reports auditory hallucinations. Patient then asks if he can live here and states "I thought if I kept having voices I could just keep staying here?"  Patient denies having any aggression or agitation feelings as well. Patient denies having any medication side effects.  Patient verbalizes understanding that he will receive long acting haldol injection today and is in agreement with this. Patient reports that he had some back pain and moved his mattress to the floor to sleep on, but then put his mattress back on the bed.   Principal Problem: Schizophrenia (HCC) Diagnosis: Principal Problem:   Schizophrenia (HCC)  Total Time spent with patient: 30 minutes  Past Psychiatric History: Patient may not be a reliable historian but from question it is stated that he has been diagnosed with schizophrenia or schizoaffective disorder for a long time. He has had multiple hospitalizations. He had previously lived in [place] but at some point move to [place] apparently after his wife passed away. He tells me that his mother is still living and that she lives in [place] and is his legal guardian. He has a lot of paranoid and disorganized animosity towards her. He denies ever having tried to kill himself. He rattles off multiple names of antipsychotics and mood stabilizers all of which she says have done him wrong and that he does not like. The only medicine he likes is either Haldol or Ativan.  Past  Medical History:  Past Medical History:  Diagnosis Date  . Asthma   . Back pain   . Paranoia [facility])     Past Surgical History:  Procedure Laterality Date  . CHOLECYSTECTOMY     Family History: History reviewed. No pertinent family history. Family Psychiatric  History: None reported Social History:  Social History   Substance and Sexual Activity  Alcohol Use No     Social History   Substance and Sexual Activity  Drug Use No    Social History   Socioeconomic History  . Marital status: Married    Spouse name: Not on file  . Number of children: Not on file  . Years of education: Not on file  . Highest education level: Not on file  Occupational History  . Not on file  Social Needs  . Financial resource strain: Patient refused  . Food insecurity    Worry: Patient refused    Inability: Patient refused  . Transportation needs    Medical: Patient refused    Non-medical: Patient refused  Tobacco Use  . Smoking status: Never Smoker  . Smokeless tobacco: Never Used  Substance and Sexual Activity  . Alcohol use: No  . Drug use: No  . Sexual activity: Not Currently  Lifestyle  . Physical activity    Days per week: Patient refused    Minutes per session: Patient refused  . Stress: Patient refused  Relationships  . Social Musician on phone: Patient refused    Gets  together: Patient refused    Attends religious service: Patient refused    Active member of club or organization: Patient refused    Attends meetings of clubs or organizations: Patient refused    Relationship status: Patient refused  Other Topics Concern  . Not on file  Social History Narrative  . Not on file   Additional Social History:                         Sleep: Poor  Appetite:  Good  Current Medications: Current Facility-Administered Medications  Medication Dose Route Frequency Provider Last Rate Last Dose  . acetaminophen (TYLENOL) tablet 650 mg  650 mg Oral Q6H PRN  Clapacs, John T, MD      . alum & mag hydroxide-simeth (MAALOX/MYLANTA) 200-200-20 MG/5ML suspension 30 mL  30 mL Oral Q4H PRN Clapacs, John T, MD   30 mL at 02/16/19 1224  . fluticasone (FLONASE) 50 MCG/ACT nasal spray 2 spray  2 spray Each Nare Daily Clapacs, Madie Reno, MD   2 spray at 02/17/19 0826  . haloperidol (HALDOL) tablet 10 mg  10 mg Oral Daily Sharma Covert, MD   10 mg at 02/17/19 6644  . haloperidol (HALDOL) tablet 15 mg  15 mg Oral QHS Sharma Covert, MD   15 mg at 02/16/19 2218  . haloperidol decanoate (HALDOL DECANOATE) 100 MG/ML injection 200 mg  200 mg Intramuscular Q30 days Tarin Navarez, Lowry Ram, FNP      . hydrOXYzine (ATARAX/VISTARIL) tablet 50 mg  50 mg Oral TID PRN Gal Smolinski, Lowry Ram, FNP   50 mg at 02/15/19 2112  . OLANZapine zydis (ZYPREXA) disintegrating tablet 10 mg  10 mg Oral Q8H PRN Sharma Covert, MD       And  . LORazepam (ATIVAN) tablet 1 mg  1 mg Oral PRN Sharma Covert, MD       And  . ziprasidone (GEODON) injection 20 mg  20 mg Intramuscular PRN Sharma Covert, MD      . magnesium hydroxide (MILK OF MAGNESIA) suspension 30 mL  30 mL Oral Daily PRN Clapacs, John T, MD      . naproxen (NAPROSYN) tablet 500 mg  500 mg Oral BID WC Randie Tallarico, Darnelle Maffucci B, FNP   500 mg at 02/17/19 0826  . traZODone (DESYREL) tablet 100 mg  100 mg Oral QHS PRN Sharma Covert, MD        Lab Results: No results found for this or any previous visit (from the past 48 hour(s)).  Blood Alcohol level:  Lab Results  Component Value Date   ETH <10 02/12/2019   ETH <10 03/47/4259    Metabolic Disorder Labs: No results found for: HGBA1C, MPG No results found for: PROLACTIN Lab Results  Component Value Date   CHOL 137 02/14/2019   TRIG 42 02/14/2019   HDL 56 02/14/2019   CHOLHDL 2.4 02/14/2019   VLDL 8 02/14/2019   LDLCALC 73 02/14/2019    Physical Findings: AIMS:  , ,  ,  ,    CIWA:    COWS:     Musculoskeletal: Strength & Muscle Tone: within normal limits Gait &  Station: normal Patient leans: N/A  Psychiatric Specialty Exam: Physical Exam  Nursing note and vitals reviewed. Constitutional: He is oriented to person, place, and time. He appears well-developed and well-nourished.  Cardiovascular: Normal rate.  Respiratory: Effort normal.  Musculoskeletal: Normal range of motion.  Neurological: He is alert and  oriented to person, place, and time.  Skin: Skin is warm.    Review of Systems  Constitutional: Negative.   HENT: Negative.   Eyes: Negative.   Respiratory: Negative.   Cardiovascular: Negative.   Gastrointestinal: Negative.   Genitourinary: Negative.   Musculoskeletal: Negative.   Skin: Negative.   Neurological: Negative.   Endo/Heme/Allergies: Negative.   Psychiatric/Behavioral: Negative.     Blood pressure (!) 139/100, pulse 86, temperature 98.1 F (36.7 C), temperature source Oral, resp. rate 18, height 6' (1.829 m), weight 127 kg, SpO2 97 %.Body mass index is 37.97 kg/m.  General Appearance: Casual  Eye Contact:  Good  Speech:  Pressured  Volume:  Normal  Mood:  Euthymic  Affect:  Appropriate  Thought Process:  Goal Directed  Orientation:  Full (Time, Place, and Person)  Thought Content:  Hallucinations: Auditory  Suicidal Thoughts:  No  Homicidal Thoughts:  No  Memory:  Immediate;   Good Recent;   Fair Remote;   Fair  Judgement:  Good  Insight:  Fair  Psychomotor Activity:  Normal  Concentration:  Concentration: Good  Recall:  Good  Fund of Knowledge:  Fair  Language:  Fair  Akathisia:  No  Handed:  Right  AIMS (if indicated):     Assets:  Desire for Improvement Physical Health Social Support  ADL's:  Intact  Cognition:  WNL  Sleep:  Number of Hours: 0   Assessment: Patient presents in the day area and is noted to be calm and co operative interacting with peers appropriately.  Patient Is noted with hyperverbal speech and is tangential at times in his conversation, but both are significantly improved from  previous encounters.  He denies any paranoid delusional thoughts, and does not appear internally preoccupied at this time. When asked about his sleep patient points to the mattress on the floor where he moved it and reported that this is where he sleeps better, patient educated on use of PRN medication for sleep if needed. Patient has now reported that [facility] and will try to get a job that he can do there. Patient has continued denying any suicidal or homicidal ideations. His auditory hallucinations he states are chronic and have never completely went away, but states they are still there. He appears to trying to use his hallucinations as a reason to stay in the hospital.   Treatment Plan Summary: Daily contact with patient to assess and evaluate symptoms and progress in treatment and Medication management   1. Starting Haldol 200mg  IM injection for patient, consulted with Dr. Toni Amendlapacs who is in agreement. Discontinue oral Haldol once injection is given. 2. Continue hydroxazine po 50mg  oral PRN three times a day for anxiety 3. Continue Zyprexa po PRN agitation Q 8 hours 4. Continue trazadone 100mg  po PRN hs for insomnia  5. Continue Q15 minute safety checks 6. Encourage group therapy participation 7. CSW to assist with discharge planning  Eddie Bunnellravis B Aribelle Mccosh, FNP 02/17/2019, 1:15 PM

## 2019-02-17 NOTE — Progress Notes (Signed)
CSW sat in with pt as he had interview via webex with [facility] for potential placement. At the end of the interview Randal Buba reported she was unsure if she would place pt, but then reported she would probably take pt and he would need a FL2 and TB test. CSW notified NP Money who ordered TB test to be administered today 02/17/19. CSW informed Randal Buba that TB test would be read Wednesday and we would plan for pt to discharge Wednesday. Randal Buba agreed and requested to receive a phone call tomorrow morning.  Evalina Field, MSW, LCSW Clinical Social Work 02/17/2019 2:34 PM

## 2019-02-17 NOTE — Plan of Care (Signed)
Patient had a few outbursts this evening. Patient redirectable and was given medications per MD orders.   Problem: Education: Goal: Emotional status will improve Outcome: Not Progressing Goal: Mental status will improve Outcome: Not Progressing

## 2019-02-17 NOTE — Plan of Care (Signed)
Patient alert and oriented x 4. Patient's  affect is flat. Upon approach, he is irritable. However, interacting appropriately with peers. Refused vital signs this morning, then shortly thereafter , came up to the nurses station to ask to have his vital signes taken. He was compliant with HS medication. Denies SI/HI/AVH 15 minutes safety checks maintained. He was awake through the night and refused any PRN sleep aids stating, "I drink a lot of coffee tonight". Will continue to monitor.

## 2019-02-17 NOTE — NC FL2 (Signed)
[place] MEDICAID FL2 LEVEL OF CARE SCREENING TOOL     IDENTIFICATION  Patient Name: Eddie Holland Birthdate: [birth year] Sex: male Admission Date (Current Location): 02/12/2019  [place] and [place] Number:  Chiropodist and Address:  [facility], [address], [place], [place] [address]      Provider Number: [id number]  Attending Physician Name and Address:  Clapacs, Jackquline Denmark, MD  Relative Name and Phone Number:       Current Level of Care: Hospital(Group Home) Recommended Level of Care: Other (Comment)(Group Home) Prior Approval Number:    Date Approved/Denied:   PASRR Number:    Discharge Plan: Other (Comment)(Group Home)    Current Diagnoses: Patient Active Problem List   Diagnosis Date Noted  . Schizoaffective disorder (HCC) 02/12/2019  . Schizoaffective disorder, bipolar type (HCC)   . Schizophrenia (HCC) 02/10/2019  . Bipolar 1 disorder (HCC) 02/10/2019  . Paranoia (HCC) 02/10/2019    Orientation RESPIRATION BLADDER Height & Weight     Self, Time, Situation, Place  Normal   Weight: 280 lb (127 kg) Height:  6' (182.9 cm)  BEHAVIORAL SYMPTOMS/MOOD NEUROLOGICAL BOWEL NUTRITION STATUS  Physically abusive(physical aggression by history)     Diet(normal)  AMBULATORY STATUS COMMUNICATION OF NEEDS Skin   Independent   Normal                       Personal Care Assistance Level of Assistance              Functional Limitations Info             SPECIAL CARE FACTORS FREQUENCY  (CHOLECYSTECTOMY history, trachiotomy)                    Contractures Contractures Info: Not present    Additional Factors Info  Code Status Code Status Info: Full             Current Medications (02/17/2019):  This is the current hospital active medication list Current Facility-Administered Medications  Medication Dose Route Frequency Provider Last Rate Last Dose  . acetaminophen (TYLENOL) tablet 650 mg  650 mg Oral  Q6H PRN Clapacs, John T, MD      . alum & mag hydroxide-simeth (MAALOX/MYLANTA) 200-200-20 MG/5ML suspension 30 mL  30 mL Oral Q4H PRN Clapacs, John T, MD   30 mL at 02/16/19 1224  . fluticasone (FLONASE) 50 MCG/ACT nasal spray 2 spray  2 spray Each Nare Daily Clapacs, Jackquline Denmark, MD   2 spray at 02/17/19 0826  . haloperidol (HALDOL) tablet 10 mg  10 mg Oral Daily Antonieta Pert, MD   10 mg at 02/17/19 8338  . haloperidol (HALDOL) tablet 15 mg  15 mg Oral QHS Antonieta Pert, MD   15 mg at 02/16/19 2218  . haloperidol decanoate (HALDOL DECANOATE) 100 MG/ML injection 200 mg  200 mg Intramuscular Q30 days Money, Gerlene Burdock, FNP      . hydrOXYzine (ATARAX/VISTARIL) tablet 50 mg  50 mg Oral TID PRN Money, Gerlene Burdock, FNP   50 mg at 02/15/19 2112  . OLANZapine zydis (ZYPREXA) disintegrating tablet 10 mg  10 mg Oral Q8H PRN Antonieta Pert, MD       And  . LORazepam (ATIVAN) tablet 1 mg  1 mg Oral PRN Antonieta Pert, MD       And  . ziprasidone (GEODON) injection 20 mg  20 mg Intramuscular PRN Antonieta Pert, MD      .  magnesium hydroxide (MILK OF MAGNESIA) suspension 30 mL  30 mL Oral Daily PRN Clapacs, John T, MD      . naproxen (NAPROSYN) tablet 500 mg  500 mg Oral BID WC Money, Darnelle Maffucci B, FNP   500 mg at 02/17/19 0826  . traZODone (DESYREL) tablet 100 mg  100 mg Oral QHS PRN Sharma Covert, MD      . tuberculin injection 5 Units  5 Units Intradermal Once Money, Lowry Ram, FNP         Discharge Medications: Please see discharge summary for a list of discharge medications.  Relevant Imaging Results:  Relevant Lab Results:   Additional Information    Rozann Lesches, LCSW

## 2019-02-17 NOTE — B[facility] Group Notes (Signed)
LCSW Group Therapy Note   02/17/2019 1:00 PM  Type of Therapy and Topic:  Group Therapy:  Overcoming Obstacles   Participation Level:  Did Not Attend   Description of Group:    In this group patients will be encouraged to explore what they see as obstacles to their own wellness and recovery. They will be guided to discuss their thoughts, feelings, and behaviors related to these obstacles. The group will process together ways to cope with barriers, with attention given to specific choices patients can make. Each patient will be challenged to identify changes they are motivated to make in order to overcome their obstacles. This group will be process-oriented, with patients participating in exploration of their own experiences as well as giving and receiving support and challenge from other group members.   Therapeutic Goals: 1. Patient will identify personal and current obstacles as they relate to admission. 2. Patient will identify barriers that currently interfere with their wellness or overcoming obstacles.  3. Patient will identify feelings, thought process and behaviors related to these barriers. 4. Patient will identify two changes they are willing to make to overcome these obstacles:      Summary of Patient Progress   X    Therapeutic Modalities:   Cognitive Behavioral Therapy Solution Focused Therapy Motivational Interviewing Relapse Prevention Therapy  Eddie Holland, MSW, LCSW 02/17/2019 10:01 AM    

## 2019-02-18 DIAGNOSIS — F203 Undifferentiated schizophrenia: Secondary | ICD-10-CM | POA: Diagnosis not present

## 2019-02-18 MED ORDER — HALOPERIDOL 5 MG PO TABS
10.0000 mg | ORAL_TABLET | Freq: Two times a day (BID) | ORAL | Status: DC
  Administered 2019-02-18 – 2019-02-20 (×4): 10 mg via ORAL
  Filled 2019-02-18 (×8): qty 2

## 2019-02-18 NOTE — Plan of Care (Signed)
Patient compliant with procedures on the unit. Patient didn't have any outbursts on the unit this evening.   Problem: Education: Goal: Emotional status will improve Outcome: Progressing Goal: Mental status will improve Outcome: Progressing

## 2019-02-18 NOTE — Progress Notes (Signed)
CSW spoke with [place] who asked about TB skin test, CSW informed her that it was administered yesterday and will be read tomorrow. Randal Buba inquired about pts medicaid status and CSW informed her that pt has medicare and disability as well as a payee and pts previous group home resubmitted for pts medicaid. Garnetta asked for contact information for pts payee and last group home. CSW provided Congo with Pts payee contact information and previous group home information.   Evalina Field, MSW, LCSW Clinical Social Work 02/18/2019 11:46 AM

## 2019-02-18 NOTE — B[facility] Group Notes (Signed)
Balance In Life 02/18/2019 1PM  Type of Therapy/Topic:  Group Therapy:  Balance in Life  Participation Level:  Did Not Attend  Description of Group:   This group will address the concept of balance and how it feels and looks when one is unbalanced. Patients will be encouraged to process areas in their lives that are out of balance and identify reasons for remaining unbalanced. Facilitators will guide patients in utilizing problem-solving interventions to address and correct the stressor making their life unbalanced. Understanding and applying boundaries will be explored and addressed for obtaining and maintaining a balanced life. Patients will be encouraged to explore ways to assertively make their unbalanced needs known to significant others in their lives, using other group members and facilitator for support and feedback.  Therapeutic Goals: 1. Patient will identify two or more emotions or situations they have that consume much of in their lives. 2. Patient will identify signs/triggers that life has become out of balance:  3. Patient will identify two ways to set boundaries in order to achieve balance in their lives:  4. Patient will demonstrate ability to communicate their needs through discussion and/or role plays  Summary of Patient Progress:    Therapeutic Modalities:   Cognitive Behavioral Therapy Solution-Focused Therapy Assertiveness Training  Lylla Eifler T Torah Pinnock, LCSW  

## 2019-02-18 NOTE — Progress Notes (Signed)
Patient is preoccupied with receiving a "booster shot for sex". Patient has been asking this Probation officer throughout the day about who can he talk to about his impotence. Patient also stated "they aren't worried about me having sex". This Probation officer explained to patient that the doctor has been informed of his concerns.

## 2019-02-18 NOTE — Plan of Care (Signed)
D- Patient alert and oriented. Patient presents in a pleasant mood on assessment stating that he slept good, and it was reported that he finally slept all night, last night. Patient had no complaints or concerns to voice to this Probation officer. Patient denies SI, HI, AVH, and pain at this time. Patient also denies any signs/symptoms of depression/anxiety, however, he reported having some issues with the television in the dayroom. Patient had no stated goals for today.  A- Scheduled medications administered to patient, per MD orders. Support and encouragement provided.  Routine safety checks conducted every 15 minutes.  Patient informed to notify staff with problems or concerns.  R- No adverse drug reactions noted. Patient contracts for safety at this time. Patient compliant with medications and treatment plan. Patient receptive, calm, and cooperative. Patient remains safe at this time.  Problem: Education: Goal: Knowledge of [place] [facility] Education information/materials will improve Outcome: Progressing Goal: Emotional status will improve Outcome: Progressing Goal: Mental status will improve Outcome: Progressing Goal: Verbalization of understanding the information provided will improve Outcome: Progressing   Problem: Activity: Goal: Interest or engagement in activities will improve Outcome: Progressing Goal: Sleeping patterns will improve Outcome: Progressing   Problem: Coping: Goal: Ability to verbalize frustrations and anger appropriately will improve Outcome: Progressing Goal: Ability to demonstrate self-control will improve Outcome: Progressing   Problem: Health Behavior/Discharge Planning: Goal: Identification of resources available to assist in meeting health care needs will improve Outcome: Progressing Goal: Compliance with treatment plan for underlying cause of condition will improve Outcome: Progressing   Problem: Physical Regulation: Goal: Ability to maintain clinical  measurements within normal limits will improve Outcome: Progressing   Problem: Safety: Goal: Periods of time without injury will increase Outcome: Progressing

## 2019-02-18 NOTE — Progress Notes (Signed)
Patient just came to this writer and stated "does the group home I'm going to allow you to smoke. I need to know these things before I go somewhere". Patient also stated "I need to know if they have my money, will they provide me with cigarettes if I don't have my check. I also need some decent clothes every now and then and I also need a smart phone that will allow me to look at pornography because I'm going to be single for the rest of my life". Patient stated that this writer needs to tell the social worker this before he goes anywhere.

## 2019-02-18 NOTE — Progress Notes (Signed)
[facility] MD Progress Note  02/18/2019 2:18 PM Eddie Holland  MRN:  [id number] Subjective: Follow-up for this gentleman with schizophrenia.  This afternoon he was on the back call because he had become agitated around other patients.  He remains frequently loud agitated and disruptive.  In interview he is aggressive in his posturing although he was not threatening or violent.  Talks rapidly very disorganized repeating a lot of his delusional grandiose content.  Some of these behavior may be intentional to be intimidating although it certainly does sound like he is not thinking clearly.  Patient received Haldol decanoate injection yesterday and oral Haldol has been cut back.  So far he continues to refuse to take any medicine other than haloperidol. Principal Problem: Schizophrenia ([place]) Diagnosis: Principal Problem:   Schizophrenia ([place])  Total Time spent with patient: 30 minutes  Past Psychiatric History: Past history of schizophrenia multiple hospitalizations especially since coming to [place] a few years ago  Past Medical History:  Past Medical History:  Diagnosis Date  . Asthma   . Back pain   . Paranoia [facility])     Past Surgical History:  Procedure Laterality Date  . CHOLECYSTECTOMY     Family History: History reviewed. No pertinent family history. Family Psychiatric  History: See previous Social History:  Social History   Substance and Sexual Activity  Alcohol Use No     Social History   Substance and Sexual Activity  Drug Use No    Social History   Socioeconomic History  . Marital status: Married    Spouse name: Not on file  . Number of children: Not on file  . Years of education: Not on file  . Highest education level: Not on file  Occupational History  . Not on file  Social Needs  . Financial resource strain: Patient refused  . Food insecurity    Worry: Patient refused    Inability: Patient refused  . Transportation needs    Medical: Patient refused     Non-medical: Patient refused  Tobacco Use  . Smoking status: Never Smoker  . Smokeless tobacco: Never Used  Substance and Sexual Activity  . Alcohol use: No  . Drug use: No  . Sexual activity: Not Currently  Lifestyle  . Physical activity    Days per week: Patient refused    Minutes per session: Patient refused  . Stress: Patient refused  Relationships  . Social Herbalist on phone: Patient refused    Gets together: Patient refused    Attends religious service: Patient refused    Active member of club or organization: Patient refused    Attends meetings of clubs or organizations: Patient refused    Relationship status: Patient refused  Other Topics Concern  . Not on file  Social History Narrative  . Not on file   Additional Social History:                         Sleep: Fair  Appetite:  Fair  Current Medications: Current Facility-Administered Medications  Medication Dose Route Frequency Provider Last Rate Last Dose  . acetaminophen (TYLENOL) tablet 650 mg  650 mg Oral Q6H PRN Lynsay Fesperman T, MD      . alum & mag hydroxide-simeth (MAALOX/MYLANTA) 200-200-20 MG/5ML suspension 30 mL  30 mL Oral Q4H PRN Miloh Alcocer T, MD   30 mL at 02/16/19 1224  . fluticasone (FLONASE) 50 MCG/ACT nasal spray 2 spray  2  spray Each Nare Daily Scotti Motter, Jackquline Denmark, MD   2 spray at 02/18/19 [phone]  . haloperidol decanoate (HALDOL DECANOATE) 100 MG/ML injection 200 mg  200 mg Intramuscular Q30 days Money, Gerlene Burdock, FNP   200 mg at 02/17/19 1644  . hydrOXYzine (ATARAX/VISTARIL) tablet 50 mg  50 mg Oral TID PRN Money, Gerlene Burdock, FNP   50 mg at 02/15/19 2112  . magnesium hydroxide (MILK OF MAGNESIA) suspension 30 mL  30 mL Oral Daily PRN Nichelle Renwick T, MD      . naproxen (NAPROSYN) tablet 500 mg  500 mg Oral BID WC Money, Feliz Beam B, FNP   500 mg at 02/18/19 0806  . OLANZapine zydis (ZYPREXA) disintegrating tablet 10 mg  10 mg Oral Q8H PRN Antonieta Pert, MD       And  .  ziprasidone (GEODON) injection 20 mg  20 mg Intramuscular PRN Antonieta Pert, MD      . traZODone (DESYREL) tablet 100 mg  100 mg Oral QHS PRN Antonieta Pert, MD   100 mg at 02/17/19 2132  . tuberculin injection 5 Units  5 Units Intradermal Once Money, Gerlene Burdock, FNP   5 Units at 02/17/19 1643    Lab Results: No results found for this or any previous visit (from the past 48 hour(s)).  Blood Alcohol level:  Lab Results  Component Value Date   ETH <10 02/12/2019   ETH <10 02/10/2019    Metabolic Disorder Labs: No results found for: HGBA1C, MPG No results found for: PROLACTIN Lab Results  Component Value Date   CHOL 137 02/14/2019   TRIG 42 02/14/2019   HDL 56 02/14/2019   CHOLHDL 2.4 02/14/2019   VLDL 8 02/14/2019   LDLCALC 73 02/14/2019    Physical Findings: AIMS:  , ,  ,  ,    CIWA:    COWS:     Musculoskeletal: Strength & Muscle Tone: within normal limits Gait & Station: normal Patient leans: N/A  Psychiatric Specialty Exam: Physical Exam  Nursing note and vitals reviewed. Constitutional: He appears well-developed and well-nourished.  HENT:  Head: Normocephalic and atraumatic.  Eyes: Pupils are equal, round, and reactive to light. Conjunctivae are normal.  Neck: Normal range of motion.  Cardiovascular: Regular rhythm and normal heart sounds.  Respiratory: Effort normal. No respiratory distress.  GI: Soft.  Musculoskeletal: Normal range of motion.  Neurological: He is alert.  Skin: Skin is warm and dry.  Psychiatric: His mood appears anxious. His affect is angry and labile. His affect is not blunt. His speech is rapid and/or pressured. He is agitated. Thought content is paranoid and delusional. Cognition and memory are impaired. He expresses impulsivity and inappropriate judgment.    Review of Systems  Constitutional: Negative.   HENT: Negative.   Eyes: Negative.   Respiratory: Negative.   Cardiovascular: Negative.   Gastrointestinal: Negative.    Musculoskeletal: Negative.   Skin: Negative.   Neurological: Negative.   Psychiatric/Behavioral: Positive for hallucinations. Negative for depression, substance abuse and suicidal ideas. The patient is nervous/anxious and has insomnia.     Blood pressure 112/83, pulse 88, temperature 97.8 F (36.6 C), temperature source Oral, resp. rate 20, height 6' (1.829 m), weight 127 kg, SpO2 95 %.Body mass index is 37.97 kg/m.  General Appearance: Disheveled  Eye Contact:  Fair  Speech:  Clear and Coherent  Volume:  Normal  Mood:  Angry, Dysphoric and Irritable  Affect:  Congruent  Thought Process:  Disorganized  Orientation:  Full (Time, Place, and Person)  Thought Content:  Illogical, Paranoid Ideation and Rumination  Suicidal Thoughts:  No  Homicidal Thoughts:  No  Memory:  Immediate;   Fair Recent;   Fair Remote;   Fair  Judgement:  Impaired  Insight:  Shallow  Psychomotor Activity:  Restlessness  Concentration:  Concentration: Poor  Recall:  Poor  Fund of Knowledge:  Fair  Language:  Fair  Akathisia:  No  Handed:  Right  AIMS (if indicated):     Assets:  Desire for Improvement  ADL's:  Impaired  Cognition:  Impaired,  Mild  Sleep:  Number of Hours: 6     Treatment Plan Summary: Daily contact with patient to assess and evaluate symptoms and progress in treatment, Medication management and Plan In addition to the Haldol injection restart oral Haldol.  Talk with him when possible about possibly adding mood stabilizers.  Patient remains to agitated and disorganized at this point even consider any kind of discharge plan for him although he has been encouraged to think seriously about allowing us to place him in a group home.  Mordecai RasmussenJohn Dustine Stickler, MD 02/18/2019, 2:18 PM

## 2019-02-18 NOTE — Progress Notes (Signed)
D - Patient was in the day room upon arrival to the unit. Patient was pleasant during assessment denying SI/HI, pain, anxiety and depression with this Probation officer. Patient endorses AVH, stating, "I always hear voices but they are getting better since I have been here." Patient was observed interacting appropriately with staff and peers on the unit. Patient didn't have any outbursts this evening like he did last night.   A - Patient didn't have any medications scheduled this evening. Patient given education. Patient given support and encouragement to be active in his treatment plan. Patient informed to let staff know if there are any issues or problems on the unit.    R - Patient being monitored Q 15 minutes for safety per unit protocol. Patient remains safe on the unit.

## 2019-02-18 NOTE — BHH Counselor (Signed)
CSW attempted to call Octavia Heir, [phone] at the group home to discuss further, however, the call had a bad signal and Randal Buba reports that she will call this CSW back.    Assunta Curtis, MSW, LCSW 02/18/2019 9:58 AM

## 2019-02-18 NOTE — BHH Counselor (Signed)
CSW received a call from Allene Pyo, [phone] who reports that patient is inappropriate for her home due to aggressive behaviors.   Assunta Curtis, MSW, LCSW 02/18/2019 2:21 PM

## 2019-02-18 NOTE — Progress Notes (Signed)
Recreation Therapy Notes    Date:02/18/2019  Time: 9:30 am  Location: Craft room  Behavioral response: Appropriate, Hyperverbal   Intervention Topic: Problem Solving  Discussion/Intervention:  Group content today was focused on relaxation. The group defined relaxation and identified healthy ways to relax. Individuals expressed how much time they spend relaxing. Patients expressed how much their life would be if they did not make time for themselves to relax. The group stated ways they could improve their relaxation techniques in the future.  Individuals participated in the intervention "Time to Relax" where they had a chance to experience different relaxation techniques.  Clinical Observations/Feedback:  Patient came to group and stated that problem solving is helping people with problems. He went on a tangent about how therapy and counseling had gotten him off drugs and that he learned therapy to help others. Participant was redirected by group facilitator several times to stay on topic and to allow others a chance to share. Individual was social with peers and staff while participating in group.   Cristie Mckinney LRT/CTRS         Zettie Gootee 02/18/2019 11:26 AM

## 2019-02-18 NOTE — Tx Team (Signed)
Interdisciplinary Treatment and Diagnostic Plan Update  02/18/2019 Time of Session: [place] MRN: [id number]  Principal Diagnosis: Schizophrenia [facility])  Secondary Diagnoses: Principal Problem:   Schizophrenia ([place])   Current Medications:  Current Facility-Administered Medications  Medication Dose Route Frequency Provider Last Rate Last Dose  . acetaminophen (TYLENOL) tablet 650 mg  650 mg Oral Q6H PRN Clapacs, John T, MD      . alum & mag hydroxide-simeth (MAALOX/MYLANTA) 200-200-20 MG/5ML suspension 30 mL  30 mL Oral Q4H PRN Clapacs, John T, MD   30 mL at 02/16/19 1224  . fluticasone (FLONASE) 50 MCG/ACT nasal spray 2 spray  2 spray Each Nare Daily Clapacs, John T, MD   2 spray at 02/18/19 0806  . haloperidol decanoate (HALDOL DECANOATE) 100 MG/ML injection 200 mg  200 mg Intramuscular Q30 days Money, Lowry Ram, FNP   200 mg at 02/17/19 1644  . hydrOXYzine (ATARAX/VISTARIL) tablet 50 mg  50 mg Oral TID PRN Money, Lowry Ram, FNP   50 mg at 02/15/19 2112  . magnesium hydroxide (MILK OF MAGNESIA) suspension 30 mL  30 mL Oral Daily PRN Clapacs, John T, MD      . naproxen (NAPROSYN) tablet 500 mg  500 mg Oral BID WC Money, Darnelle Maffucci B, FNP   500 mg at 02/18/19 0806  . OLANZapine zydis (ZYPREXA) disintegrating tablet 10 mg  10 mg Oral Q8H PRN Sharma Covert, MD       And  . ziprasidone (GEODON) injection 20 mg  20 mg Intramuscular PRN Sharma Covert, MD      . traZODone (DESYREL) tablet 100 mg  100 mg Oral QHS PRN Sharma Covert, MD   100 mg at 02/17/19 2132  . tuberculin injection 5 Units  5 Units Intradermal Once Money, Lowry Ram, FNP   5 Units at 02/17/19 1643   PTA Medications: No medications prior to admission.    Patient Stressors: Financial difficulties Health problems Other: Living arrangement  Patient Strengths: Ability for insight Communication skills Motivation for treatment/growth  Treatment Modalities: Medication Management, Group therapy, Case  management,  1 to 1 session with clinician, Psychoeducation, Recreational therapy.   Physician Treatment Plan for Primary Diagnosis: Schizophrenia ([place]) Long Term Goal(s): Improvement in symptoms so as ready for discharge Improvement in symptoms so as ready for discharge   Short Term Goals: Ability to verbalize feelings will improve Ability to demonstrate self-control will improve Ability to identify and develop effective coping behaviors will improve Compliance with prescribed medications will improve  Medication Management: Evaluate patient's response, side effects, and tolerance of medication regimen.  Therapeutic Interventions: 1 to 1 sessions, Unit Group sessions and Medication administration.  Evaluation of Outcomes: Progressing  Physician Treatment Plan for Secondary Diagnosis: Principal Problem:   Schizophrenia ([place])  Long Term Goal(s): Improvement in symptoms so as ready for discharge Improvement in symptoms so as ready for discharge   Short Term Goals: Ability to verbalize feelings will improve Ability to demonstrate self-control will improve Ability to identify and develop effective coping behaviors will improve Compliance with prescribed medications will improve     Medication Management: Evaluate patient's response, side effects, and tolerance of medication regimen.  Therapeutic Interventions: 1 to 1 sessions, Unit Group sessions and Medication administration.  Evaluation of Outcomes: Progressing   RN Treatment Plan for Primary Diagnosis: Schizophrenia ([place]) Long Term Goal(s): Knowledge of disease and therapeutic regimen to maintain health will improve  Short Term Goals: Ability to demonstrate self-control, Ability to participate in decision making  will improve, Ability to identify and develop effective coping behaviors will improve and Compliance with prescribed medications will improve  Medication Management: RN will administer medications as ordered by  provider, will assess and evaluate patient's response and provide education to patient for prescribed medication. RN will report any adverse and/or side effects to prescribing provider.  Therapeutic Interventions: 1 on 1 counseling sessions, Psychoeducation, Medication administration, Evaluate responses to treatment, Monitor vital signs and CBGs as ordered, Perform/monitor CIWA, COWS, AIMS and Fall Risk screenings as ordered, Perform wound care treatments as ordered.  Evaluation of Outcomes: Progressing   LCSW Treatment Plan for Primary Diagnosis: Schizophrenia ([facility]) Long Term Goal(s): Safe transition to appropriate next level of care at discharge, Engage patient in therapeutic group addressing interpersonal concerns.  Short Term Goals: Engage patient in aftercare planning with referrals and resources and Increase skills for wellness and recovery  Therapeutic Interventions: Assess for all discharge needs, 1 to 1 time with Social worker, Explore available resources and support systems, Assess for adequacy in community support network, Educate family and significant other(s) on suicide prevention, Complete Psychosocial Assessment, Interpersonal group therapy.  Evaluation of Outcomes: Progressing   Progress in Treatment: Attending groups: Yes. Participating in groups: Yes. Taking medication as prescribed: Yes. Toleration medication: Yes. Family/Significant other contact made: Yes, individual(s) contacted:  pts cousin and payee Patient understands diagnosis: No. Discussing patient identified problems/goals with staff: Yes. Medical problems stabilized or resolved: Yes. Denies suicidal/homicidal ideation: Yes. Issues/concerns per patient self-inventory: No. Other: NA  New problem(s) identified: No, Describe:  None reported  New Short Term/Long Term Goal(s):Attend outpatient treatment, take medication as prescribed, develop and implement healthy coping methods  Patient Goals:  "Go back to  my state"  Discharge Plan or Barriers: Pt will return to his group home and follow up with outpatient treatment. Update 02/18/19: SPE pamphlet, Mobile Crisis information, and AA/NA information provided to patient for additional community support and resources. Pt has an appointment scheduled with [facility] on 10/26 at 2:30. Pt also has been tentatively accepted at [facility].  Reason for Continuation of Hospitalization: Medication stabilization  Estimated Length of Stay: 02/19/19  Attendees: Patient:A 02/18/2019 9:45 AM  Physician: Mordecai Rasmussen 02/18/2019 9:45 AM  Nursing:  02/18/2019 9:45 AM  RN Care Manager: 02/18/2019 9:45 AM  Social Worker:  Zollie Scale Ivry Pigue 02/18/2019 9:45 AM  Recreational Therapist:  02/18/2019 9:45 AM  Other:  02/18/2019 9:45 AM  Other:  02/18/2019 9:45 AM  Other: 02/18/2019 9:45 AM    Scribe for Treatment Team: Charlann Lange Katrece Roediger, LCSW 02/18/2019 9:45 AM

## 2019-02-19 DIAGNOSIS — F203 Undifferentiated schizophrenia: Secondary | ICD-10-CM | POA: Diagnosis not present

## 2019-02-19 NOTE — Progress Notes (Signed)
Pt came by the nurses station and stated that his advisors are telling him to stop taking his haldol. When pressed for a name or names, patient stated that it was the voices in his head. He states that the voices in his head told him to stop taking the haldol because its not good for him, but he should take the depakote, and that's why he came by to take the medicine. Pt has pressured speech and appeared anxious because he has been in his room since the start of shift counting out loud and doing wall push ups .Pt given prn zyprexa and atarax for agitaton and anxiety respectively at 2007pm. Pt stated the medications tasted like candy after he took them. Pt also given prn trazodone for sleep. Pt in the group room talking to peers and watching tv. Q15 minute safety checks maintained.

## 2019-02-19 NOTE — BHH Counselor (Signed)
CSW spoke with Octavia Heir at the group home who reports that she will have an answer in regards to the patient coming to her group home on 02/20/2019.    She reports that she spoke with prvious group home who deny previous reports to [facility] staff that they had reapplied for the patient's Medicaid.  Assunta Curtis, MSW, LCSW 02/19/2019 4:17 PM

## 2019-02-19 NOTE — Progress Notes (Signed)
CSW received a phone call from [place] with [facility] who reported she is working on pts funding source as he does not currently have medicaid and will not be able to get his medication without medicaid. Randal Buba reported plans to contact Tomi Bamberger from pts old group home to see if she could find out who pts medicaid worker is and get more information regarding his medicaid status.   Evalina Field, MSW, LCSW Clinical Social Work 02/19/2019 11:24 AM

## 2019-02-19 NOTE — Progress Notes (Signed)
Recreation Therapy Notes    Date:02/19/2019  Time: 9:30 am  Location: Craft room  Behavioral response: Appropriate, Redirected  Intervention Topic: Self-esteem  Discussion/Intervention:  Group content today was focused on self-esteem. Patient defined self-esteem and where it comes form. The group described reasons self-esteem is important. Individuals stated things that impact self-esteem and positive ways to improve self-esteem. The group participated in the intervention "Collage of Me" where patients were able to create a collage of positive things that makes them who they are. Clinical Observations/Feedback:  Patient came to group and stated he has good self -esteem. He defined self-esteem as confidence and being sure of self. Participant became negative during group stating, "this group is not helping me". Patient was redirected and educated by group facilitator that group is not mandatory, and he could leave. Individual refused to leave but later left stating "I have proved my point".  Eddie Holland LRT/CTRS         Merideth Bosque 02/19/2019 11:19 AM

## 2019-02-19 NOTE — Progress Notes (Addendum)
Pt has been very loud today, needy, attention seeking and overbearing. He is very upset that he is not leaving today. He has attend groups and went outside. Collier Bullock RN

## 2019-02-19 NOTE — Plan of Care (Signed)
Pt denies depression, anxiety, SI, HI and AVH. Pt was educated on care plan and verbalizes understanding. Haizley Cannella RN Problem: Education: Goal: Knowledge of [place] [facility] Education information/materials will improve Outcome: Progressing Goal: Emotional status will improve Outcome: Progressing Goal: Mental status will improve Outcome: Progressing Goal: Verbalization of understanding the information provided will improve Outcome: Progressing   Problem: Activity: Goal: Interest or engagement in activities will improve Outcome: Progressing Goal: Sleeping patterns will improve Outcome: Progressing   Problem: Coping: Goal: Ability to verbalize frustrations and anger appropriately will improve Outcome: Progressing Goal: Ability to demonstrate self-control will improve Outcome: Progressing   Problem: Health Behavior/Discharge Planning: Goal: Identification of resources available to assist in meeting health care needs will improve Outcome: Progressing Goal: Compliance with treatment plan for underlying cause of condition will improve Outcome: Progressing   Problem: Physical Regulation: Goal: Ability to maintain clinical measurements within normal limits will improve Outcome: Progressing   Problem: Safety: Goal: Periods of time without injury will increase Outcome: Progressing   

## 2019-02-19 NOTE — Progress Notes (Signed)
[facility]  02/19/2019 4:46 PM Allayne Gitelmannthony Soderlund  MRN:  [id number] Subjective: Follow-up for this gentleman with schizophrenia.  He is less intrusive and less agitated than when he first came in although still frequently demanding of staff.  When he does not get his way he will sometimes act out as today he went for a long period of time refusing to speak.  He has not been violent or threatening and denies suicidal or homicidal thoughts.  Endorses hallucinations but indicates they are no worse than usual and do not bother him.  Blood pressure slightly high but otherwise physically well.  We had been hoping to get him discharged to a group home today but it turned out that his finances were not as good as we had thought. Principal Problem: Schizophrenia (HCC) Diagnosis: Principal Problem:   Schizophrenia (HCC)  Total Time spent with patient: 20 minutes  Past Psychiatric History: Past history of schizophrenia probably pretty much lifetime multiple hospitalizations with the whole course getting worse and the time that [place] has been in [place]  Past Medical History:  Past Medical History:  Diagnosis Date  . Asthma   . Back pain   . Paranoia [facility](HCC)     Past Surgical History:  Procedure Laterality Date  . CHOLECYSTECTOMY     Family History: History reviewed. No pertinent family history. Family Psychiatric  History: See previous Social History:  Social History   Substance and Sexual Activity  Alcohol Use No     Social History   Substance and Sexual Activity  Drug Use No    Social History   Socioeconomic History  . Marital status: Married    Spouse name: Not on file  . Number of children: Not on file  . Years of education: Not on file  . Highest education level: Not on file  Occupational History  . Not on file  Social Needs  . Financial resource strain: Patient refused  . Food insecurity    Worry: Patient refused    Inability: Patient refused  . Transportation  needs    Medical: Patient refused    Non-medical: Patient refused  Tobacco Use  . Smoking status: Never Smoker  . Smokeless tobacco: Never Used  Substance and Sexual Activity  . Alcohol use: No  . Drug use: No  . Sexual activity: Not Currently  Lifestyle  . Physical activity    Days per week: Patient refused    Minutes per session: Patient refused  . Stress: Patient refused  Relationships  . Social Musicianconnections    Talks on phone: Patient refused    Gets together: Patient refused    Attends religious service: Patient refused    Active member of club or organization: Patient refused    Attends meetings of clubs or organizations: Patient refused    Relationship status: Patient refused  Other Topics Concern  . Not on file  Social History Narrative  . Not on file   Additional Social History:                         Sleep: Fair  Appetite:  Fair  Current Medications: Current Facility-Administered Medications  Medication Dose Route Frequency Provider Last Rate Last Dose  . acetaminophen (TYLENOL) tablet 650 mg  650 mg Oral Q6H PRN ,  T, MD      . alum & mag hydroxide-simeth (MAALOX/MYLANTA) 200-200-20 MG/5ML suspension 30 mL  30 mL Oral Q4H PRN ,  T,  MD   30 mL at 02/16/19 1224  . fluticasone (FLONASE) 50 MCG/ACT nasal spray 2 spray  2 spray Each Nare Daily ,  T, MD   2 spray at 02/19/19 0745  . haloperidol (HALDOL) tablet 10 mg  10 mg Oral BID ,  T, MD   10 mg at 02/19/19 0744  . haloperidol decanoate (HALDOL DECANOATE) 100 MG/ML injection 200 mg  200 mg Intramuscular Q30 days Money, Baltic B, FNP   200 mg at 02/17/19 1644  . hydrOXYzine (ATARAX/VISTARIL) tablet 50 mg  50 mg Oral TID PRN Money, Lowry Ram, FNP   50 mg at 02/15/19 2112  . magnesium hydroxide (MILK OF MAGNESIA) suspension 30 mL  30 mL Oral Daily PRN ,  T, MD      . naproxen (NAPROSYN) tablet 500 mg  500 mg Oral BID WC Money, Darnelle Maffucci B, FNP   500 mg at  02/19/19 0744  . OLANZapine zydis (ZYPREXA) disintegrating tablet 10 mg  10 mg Oral Q8H PRN Sharma Covert, MD       And  . ziprasidone (GEODON) injection 20 mg  20 mg Intramuscular PRN Sharma Covert, MD      . traZODone (DESYREL) tablet 100 mg  100 mg Oral QHS PRN Sharma Covert, MD   100 mg at 02/17/19 2132    Lab Results: No results found for this or any previous visit (from the past 57 hour(s)).  Blood Alcohol level:  Lab Results  Component Value Date   ETH <10 02/12/2019   ETH <10 66/09/3014    Metabolic Disorder Labs: No results found for: HGBA1C, MPG No results found for: PROLACTIN Lab Results  Component Value Date   CHOL 137 02/14/2019   TRIG 42 02/14/2019   HDL 56 02/14/2019   CHOLHDL 2.4 02/14/2019   VLDL 8 02/14/2019   LDLCALC 73 02/14/2019    Physical Findings: AIMS:  , ,  ,  ,    CIWA:    COWS:     Musculoskeletal: Strength & Muscle Tone: within normal limits Gait & Station: normal Patient leans: N/A  Psychiatric Specialty Exam: Physical Exam  Nursing note and vitals reviewed. Constitutional: He appears well-developed and well-nourished.  HENT:  Head: Normocephalic and atraumatic.  Eyes: Pupils are equal, round, and reactive to light. Conjunctivae are normal.  Neck: Normal range of motion.  Cardiovascular: Regular rhythm and normal heart sounds.  Respiratory: Effort normal. No respiratory distress.  GI: Soft.  Musculoskeletal: Normal range of motion.  Neurological: He is alert.  Skin: Skin is warm and dry.  Psychiatric: His affect is blunt and inappropriate. His speech is tangential. He is not aggressive and not hyperactive. Cognition and memory are impaired. He expresses inappropriate judgment. He expresses no homicidal and no suicidal ideation. He is noncommunicative.    Review of Systems  Constitutional: Negative.   HENT: Negative.   Eyes: Negative.   Respiratory: Negative.   Cardiovascular: Negative.   Gastrointestinal:  Negative.   Musculoskeletal: Negative.   Skin: Negative.   Neurological: Negative.   Psychiatric/Behavioral: Negative.     Blood pressure (!) 140/95, pulse 82, temperature 97.8 F (36.6 C), temperature source Oral, resp. rate 20, height 6' (1.829 m), weight 127 kg, SpO2 95 %.Body mass index is 37.97 kg/m.  General Appearance: Casual  Eye Contact:  Good  Speech:  Slow  Volume:  Decreased  Mood:  Euthymic  Affect:  Constricted  Thought Process:  NA  Orientation:  Full (Time, Place, and  Person)  Thought Content:  Illogical and Rumination  Suicidal Thoughts:  No  Homicidal Thoughts:  No  Memory:  Immediate;   Fair Recent;   Fair Remote;   Fair  Judgement:  Fair  Insight:  Fair  Psychomotor Activity:  Normal  Concentration:  Concentration: Fair  Recall:  [employer] of Knowledge:  Fair  Language:  Fair  Akathisia:  No  Handed:  Right  AIMS (if indicated):     Assets:  Desire for Improvement Housing Physical Health Resilience  ADL's:  Intact  Cognition:  WNL  Sleep:  Number of Hours: 7     Treatment Plan Summary: Daily contact with patient to assess and evaluate symptoms and progress in treatment, Medication management and Plan Patient is fairly stable and has been compliant with the Haldol.  Not showing signs of any stiffness or side effects.  Although he is still blustery he is also getting along reasonably well and not causing any disturbance.  Likely discharge as soon as we can establish an appropriate way to finance and get him into some place to live.  Mordecai Rasmussen, MD 02/19/2019, 4:46 PM

## 2019-02-19 NOTE — Progress Notes (Signed)
[facility] MD Progress Note  02/19/2019 1:23 PM Eddie Holland  MRN:  [id number]   Subjective:  Patient seen in person for follow-up for this patient with schizophrenia.  Patient is only answering questions by shaking or nodding his head and refuses to answer questions verbally.  Patient has denied any suicidal or homicidal ideations and denies any hallucinations.  Patient denies that he is upset.  Patient nods yes to stating that he is ready to go to the group home as soon as he is excepted.  Patient denies having any complaints or concerns at this time.  Patient also denies having any medication side effects.  Patient agrees that he has been sleeping well and having a good appetite.  Principal Problem: Schizophrenia (HCC) Diagnosis: Principal Problem:   Schizophrenia (HCC)  Total Time spent with patient: 20 minutes  Past Psychiatric History: Patient may not be a reliable historian but from question it is stated that he has been diagnosed with schizophrenia or schizoaffective disorder for a long time. He has had multiple hospitalizations. He had previously lived in [place] but at some point move to [place] apparently after his wife passed away. He tells me that his mother is still living and that she lives in [place] and is his legal guardian. He has a lot of paranoid and disorganized animosity towards her. He denies ever having tried to kill himself. He rattles off multiple names of antipsychotics and mood stabilizers all of which she says have done him wrong and that he does not like. The only medicine he likes is either Haldol or Ativan.  Past Medical History:  Past Medical History:  Diagnosis Date  . Asthma   . Back pain   . Paranoia [facility])     Past Surgical History:  Procedure Laterality Date  . CHOLECYSTECTOMY     Family History: History reviewed. No pertinent family history. Family Psychiatric  History: None reported Social History:  Social History   Substance and  Sexual Activity  Alcohol Use No     Social History   Substance and Sexual Activity  Drug Use No    Social History   Socioeconomic History  . Marital status: Married    Spouse name: Not on file  . Number of children: Not on file  . Years of education: Not on file  . Highest education level: Not on file  Occupational History  . Not on file  Social Needs  . Financial resource strain: Patient refused  . Food insecurity    Worry: Patient refused    Inability: Patient refused  . Transportation needs    Medical: Patient refused    Non-medical: Patient refused  Tobacco Use  . Smoking status: Never Smoker  . Smokeless tobacco: Never Used  Substance and Sexual Activity  . Alcohol use: No  . Drug use: No  . Sexual activity: Not Currently  Lifestyle  . Physical activity    Days per week: Patient refused    Minutes per session: Patient refused  . Stress: Patient refused  Relationships  . Social Musician on phone: Patient refused    Gets together: Patient refused    Attends religious service: Patient refused    Active member of club or organization: Patient refused    Attends meetings of clubs or organizations: Patient refused    Relationship status: Patient refused  Other Topics Concern  . Not on file  Social History Narrative  . Not on file  Additional Social History:                         Sleep: Good  Appetite:  Good  Current Medications: Current Facility-Administered Medications  Medication Dose Route Frequency Provider Last Rate Last Dose  . acetaminophen (TYLENOL) tablet 650 mg  650 mg Oral Q6H PRN Clapacs, John T, MD      . alum & mag hydroxide-simeth (MAALOX/MYLANTA) 200-200-20 MG/5ML suspension 30 mL  30 mL Oral Q4H PRN Clapacs, John T, MD   30 mL at 02/16/19 1224  . fluticasone (FLONASE) 50 MCG/ACT nasal spray 2 spray  2 spray Each Nare Daily Clapacs, John T, MD   2 spray at 02/19/19 0745  . haloperidol (HALDOL) tablet 10 mg  10 mg  Oral BID Clapacs, John T, MD   10 mg at 02/19/19 0744  . haloperidol decanoate (HALDOL DECANOATE) 100 MG/ML injection 200 mg  200 mg Intramuscular Q30 days Money, Elwoodravis B, FNP   200 mg at 02/17/19 1644  . hydrOXYzine (ATARAX/VISTARIL) tablet 50 mg  50 mg Oral TID PRN Money, Gerlene Burdockravis B, FNP   50 mg at 02/15/19 2112  . magnesium hydroxide (MILK OF MAGNESIA) suspension 30 mL  30 mL Oral Daily PRN Clapacs, John T, MD      . naproxen (NAPROSYN) tablet 500 mg  500 mg Oral BID WC Money, Feliz Beamravis B, FNP   500 mg at 02/19/19 0744  . OLANZapine zydis (ZYPREXA) disintegrating tablet 10 mg  10 mg Oral Q8H PRN Antonieta Pertlary, Greg Lawson, MD       And  . ziprasidone (GEODON) injection 20 mg  20 mg Intramuscular PRN Antonieta Pertlary, Greg Lawson, MD      . traZODone (DESYREL) tablet 100 mg  100 mg Oral QHS PRN Antonieta Pertlary, Greg Lawson, MD   100 mg at 02/17/19 2132  . tuberculin injection 5 Units  5 Units Intradermal Once Money, Gerlene Burdockravis B, FNP   5 Units at 02/17/19 1643    Lab Results: No results found for this or any previous visit (from the past 48 hour(s)).  Blood Alcohol level:  Lab Results  Component Value Date   ETH <10 02/12/2019   ETH <10 02/10/2019    Metabolic Disorder Labs: No results found for: HGBA1C, MPG No results found for: PROLACTIN Lab Results  Component Value Date   CHOL 137 02/14/2019   TRIG 42 02/14/2019   HDL 56 02/14/2019   CHOLHDL 2.4 02/14/2019   VLDL 8 02/14/2019   LDLCALC 73 02/14/2019    Physical Findings: AIMS:  , ,  ,  ,    CIWA:    COWS:     Musculoskeletal: Strength & Muscle Tone: within normal limits Gait & Station: normal Patient leans: N/A  Psychiatric Specialty Exam: Physical Exam  Nursing note and vitals reviewed. Constitutional: He is oriented to person, place, and time. He appears well-developed and well-nourished.  Cardiovascular: Normal rate.  Respiratory: Effort normal.  Musculoskeletal: Normal range of motion.  Neurological: He is alert and oriented to person, place,  and time.  Skin: Skin is warm.  Psychiatric: His affect is blunt. He is withdrawn. He is not aggressive. Cognition and memory are normal. He does not express impulsivity. He expresses no homicidal and no suicidal ideation. He is noncommunicative.    Review of Systems  Constitutional: Negative.   HENT: Negative.   Eyes: Negative.   Respiratory: Negative.   Cardiovascular: Negative.   Gastrointestinal: Negative.   Genitourinary: Negative.  Musculoskeletal: Negative.   Skin: Negative.   Neurological: Negative.   Endo/Heme/Allergies: Negative.   Psychiatric/Behavioral: Negative for hallucinations and suicidal ideas.    Blood pressure (!) 140/95, pulse 82, temperature 97.8 F (36.6 C), temperature source Oral, resp. rate 20, height 6' (1.829 m), weight 127 kg, SpO2 95 %.Body mass index is 37.97 kg/m.  General Appearance: Disheveled  Eye Contact:  Good  Speech:  Refusing to answer  Volume:  None  Mood:  NA  Affect:  Flat  Thought Process:  Coherent  Orientation:  NA  Thought Content:  NA  Suicidal Thoughts:  No  Homicidal Thoughts:  No  Memory:  NA  Judgement:  Fair  Insight:  Fair  Psychomotor Activity:  Normal  Concentration:  Concentration: NA  Recall:  NA  Fund of Knowledge:  NA  Language:  Poor  Akathisia:  No  Handed:  Right  AIMS (if indicated):     Assets:  Financial Resources/Insurance Housing Resilience  ADL's:  Intact  Cognition:  WNL  Sleep:  Number of Hours: 7   Assessment: Patient presents in the hallway and is sitting in a chair.  Patient is pleasant, calm.  Patient is not very cooperative as far as answering questions verbally.  His only answering questions by nodding or shaking his head for yes and no answers.  However the patient is not causing any issues today.  It was reported from the nursing staff that the patient was possibly upset because he kept requesting the remote control for the TV and requesting to be in the day room alone to what TV.   Patient has continued to deny any suicidal homicidal ideations and denies hallucinations.  CSW is still current continue to work on getting the patient placed into a group home.  Treatment Plan Summary: Daily contact with patient to assess and evaluate symptoms and progress in treatment and Medication management  Continue Haldol 10 mg p.o. twice daily for mood stability Continue Haldol Decanoate 200 mg IM injection every 30 days last dose was on 02/17/2019 Continue Vistaril 50 mg p.o. 3 times daily as needed for anxiety Continue trazodone 100 mg p.o. nightly as needed for insomnia Encourage group therapy participation Continue every 15 safety checks CSW still working with group home for placement potentially tomorrow  Lewis Shock, FNP 02/19/2019, 1:23 PM

## 2019-02-19 NOTE — BHH Group Notes (Signed)
LCSW Group Therapy Note  02/19/2019 11:43 AM  Type of Therapy/Topic:  Group Therapy:  Emotion Regulation  Participation Level:  Did Not Attend   Description of Group:   The purpose of this group is to assist patients in learning to regulate negative emotions and experience positive emotions. Patients will be guided to discuss ways in which they have been vulnerable to their negative emotions. These vulnerabilities will be juxtaposed with experiences of positive emotions or situations, and patients will be challenged to use positive emotions to combat negative ones. Special emphasis will be placed on coping with negative emotions in conflict situations, and patients will process healthy conflict resolution skills.  Therapeutic Goals: 1. Patient will identify two positive emotions or experiences to reflect on in order to balance out negative emotions 2. Patient will label two or more emotions that they find the most difficult to experience 3. Patient will demonstrate positive conflict resolution skills through discussion and/or role plays  Summary of Patient Progress: x   Therapeutic Modalities:   Cognitive Behavioral Therapy Feelings Identification Dialectical Behavioral Therapy   Evalina Field, MSW, LCSW Clinical Social Work 02/19/2019 11:43 AM

## 2019-02-20 DIAGNOSIS — F203 Undifferentiated schizophrenia: Secondary | ICD-10-CM | POA: Diagnosis not present

## 2019-02-20 NOTE — BHH Counselor (Signed)
CSW attempted to get clarity on if [facility] [phone] would be picking the patient up.  She again expressed concern on if previous group home had reapplied for Medicaid or Social Security.  She reports that she will reach out to some resources then follow up with this CSW.  Assunta Curtis, MSW, LCSW 02/20/2019 10:50 AM

## 2019-02-20 NOTE — Progress Notes (Signed)
CSW contacted Trecia Rogers with financial resources to inquire if she was able to identify if pt has medicaid or if it has been resubmitted. Amy reported she was unable to see that information due to not being the ones who submitted for pt.   Evalina Field, MSW, LCSW Clinical Social Work 02/20/2019 11:39 AM

## 2019-02-20 NOTE — Plan of Care (Signed)
Pt endorses auditory hallucination and anxiety. Pt in his room at the beginning of the shift counting out loud while doing wall push ups. Medications given for anxiety and agitation effective. Pt went off to the group room. Q 15 minutes safety checks maintained.  Problem: Education: Goal: Knowledge of [place] [facility] Education information/materials will improve Outcome: Progressing Goal: Emotional status will improve Outcome: Progressing Goal: Mental status will improve Outcome: Progressing Goal: Verbalization of understanding the information provided will improve Outcome: Progressing   Problem: Activity: Goal: Interest or engagement in activities will improve Outcome: Progressing Goal: Sleeping patterns will improve Outcome: Progressing   Problem: Coping: Goal: Ability to verbalize frustrations and anger appropriately will improve Outcome: Progressing Goal: Ability to demonstrate self-control will improve Outcome: Progressing   Problem: Health Behavior/Discharge Planning: Goal: Identification of resources available to assist in meeting health care needs will improve Outcome: Progressing Goal: Compliance with treatment plan for underlying cause of condition will improve Outcome: Progressing   Problem: Physical Regulation: Goal: Ability to maintain clinical measurements within normal limits will improve Outcome: Progressing   Problem: Safety: Goal: Periods of time without injury will increase Outcome: Progressing

## 2019-02-20 NOTE — Plan of Care (Signed)
D: Pt denies SI/HI A/V hallucinations. Patient has been out in milieu today.  A: Pt was offered support and encouragement. Pt was given scheduled medications. Pt was encourage to attend groups. Q 15 minute checks were done for safety.  R:Pt attends groups and interacts well with peers and staff. Pt is taking medication. Pt has no complaints.Pt receptive to treatment and safety maintained on unit.    Problem: Education: Goal: Knowledge of [place] [facility] Education information/materials will improve Outcome: Progressing Goal: Emotional status will improve Outcome: Progressing Goal: Mental status will improve Outcome: Progressing Goal: Verbalization of understanding the information provided will improve Outcome: Progressing   Problem: Activity: Goal: Interest or engagement in activities will improve Outcome: Progressing Goal: Sleeping patterns will improve Outcome: Progressing   Problem: Coping: Goal: Ability to verbalize frustrations and anger appropriately will improve Outcome: Progressing Goal: Ability to demonstrate self-control will improve Outcome: Progressing   Problem: Health Behavior/Discharge Planning: Goal: Identification of resources available to assist in meeting health care needs will improve Outcome: Progressing Goal: Compliance with treatment plan for underlying cause of condition will improve Outcome: Progressing   Problem: Physical Regulation: Goal: Ability to maintain clinical measurements within normal limits will improve Outcome: Progressing   Problem: Safety: Goal: Periods of time without injury will increase Outcome: Progressing

## 2019-02-20 NOTE — BHH Counselor (Signed)
CSW attempted to contact patient's group home, [facility], Manuela Schwartz [phone] to determine if they had reapplied for Medicaid as previously reported.  There is some confusion, and CSW was seeking clarity.  CSW was unable to speak with anyone and left a HIPAA compliant voicemail.  Assunta Curtis, MSW, LCSW 02/20/2019 10:40 AM

## 2019-02-20 NOTE — BHH Group Notes (Signed)
LCSW Group Therapy Note  02/20/2019 1:00 PM  Type of Therapy/Topic:  Group Therapy:  Balance in Life  Participation Level:  Minimal  Description of Group:    This group will address the concept of balance and how it feels and looks when one is unbalanced. Patients will be encouraged to process areas in their lives that are out of balance and identify reasons for remaining unbalanced. Facilitators will guide patients in utilizing problem-solving interventions to address and correct the stressor making their life unbalanced. Understanding and applying boundaries will be explored and addressed for obtaining and maintaining a balanced life. Patients will be encouraged to explore ways to assertively make their unbalanced needs known to significant others in their lives, using other group members and facilitator for support and feedback.  Therapeutic Goals: 1. Patient will identify two or more emotions or situations they have that consume much of in their lives. 2. Patient will identify signs/triggers that life has become out of balance:  3. Patient will identify two ways to set boundaries in order to achieve balance in their lives:  4. Patient will demonstrate ability to communicate their needs through discussion and/or role plays  Summary of Patient Progress: Patient was present in group.  Patient reports that "structure" is what it means to him to be balanced.  He did not engage in further group discussion.    Therapeutic Modalities:   Cognitive Behavioral Therapy Solution-Focused Therapy Assertiveness Training  Assunta Curtis MSW, LCSW 02/20/2019 11:31 AM

## 2019-02-20 NOTE — Progress Notes (Signed)
Recreation Therapy Notes  Date:02/20/2019  Time: 9:30 am  Location: Craft room  Behavioral response: Hyperverbal, Redirection needed, off topic  Intervention Topic: Emotions  Discussion/Intervention:  Group content on today was focused on emotions. The group identified what emotions are and why it is important to have emotions. Patients expressed some positive and negative emotions. Individuals gave some past experiences on how they normally dealt with emotions in the past. The group described some positive ways to deal with emotions in the future. Patients participated in the intervention "Name the Eddie Holland" where individuals were given a chance to experience different emotions.  Clinical Observations/Feedback:  Patient came to group and stated " don't let emotions dictate the way you live life." He stated that he deal with emotions by reading, watching televisions, eating and talking. Participant identified cheerful and optimistic and common emotions for him. Individual was social with peers and staff while participating in the group intervention.  [name] LRT/CTRS         [name] 02/20/2019 11:40 AM

## 2019-02-20 NOTE — Progress Notes (Signed)
[facility] MD Progress Note  02/20/2019 12:23 PM Eddie Holland  MRN:  [id number] Subjective: Follow-up for this patient with schizophrenia.  He came to talk with me in my office today.  Even though he was slightly intrusive and coming to the office he was appropriate in conversation or at least much better than he had been previously.  He talked at some length about his desire to get a job.  Some of this was psychotic talk but a little of it made sense as well.  He did not raise his voice he was not aggressive or threatening.  Still jumps around from topic to topic but does not sound as bizarre anymore.  Denies any hallucinations.  Cooperative with medication.  We are still trying to find all the details of his social situation so we can arrange discharge planning. Principal Problem: Schizophrenia ([place]) Diagnosis: Principal Problem:   Schizophrenia ([place])  Total Time spent with patient: 30 minutes  Past Psychiatric History: History of chronic psychotic disorder multiple hospitalizations  Past Medical History:  Past Medical History:  Diagnosis Date  . Asthma   . Back pain   . Paranoia [facility])     Past Surgical History:  Procedure Laterality Date  . CHOLECYSTECTOMY     Family History: History reviewed. No pertinent family history. Family Psychiatric  History: See previous Social History:  Social History   Substance and Sexual Activity  Alcohol Use No     Social History   Substance and Sexual Activity  Drug Use No    Social History   Socioeconomic History  . Marital status: Married    Spouse name: Not on file  . Number of children: Not on file  . Years of education: Not on file  . Highest education level: Not on file  Occupational History  . Not on file  Social Needs  . Financial resource strain: Patient refused  . Food insecurity    Worry: Patient refused    Inability: Patient refused  . Transportation needs    Medical: Patient refused    Non-medical: Patient refused   Tobacco Use  . Smoking status: Never Smoker  . Smokeless tobacco: Never Used  Substance and Sexual Activity  . Alcohol use: No  . Drug use: No  . Sexual activity: Not Currently  Lifestyle  . Physical activity    Days per week: Patient refused    Minutes per session: Patient refused  . Stress: Patient refused  Relationships  . Social Herbalist on phone: Patient refused    Gets together: Patient refused    Attends religious service: Patient refused    Active member of club or organization: Patient refused    Attends meetings of clubs or organizations: Patient refused    Relationship status: Patient refused  Other Topics Concern  . Not on file  Social History Narrative  . Not on file   Additional Social History:                         Sleep: Fair  Appetite:  Fair  Current Medications: Current Facility-Administered Medications  Medication Dose Route Frequency Provider Last Rate Last Dose  . acetaminophen (TYLENOL) tablet 650 mg  650 mg Oral Q6H PRN Margarine Grosshans T, MD      . alum & mag hydroxide-simeth (MAALOX/MYLANTA) 200-200-20 MG/5ML suspension 30 mL  30 mL Oral Q4H PRN Tyronza Happe T, MD   30 mL at 02/16/19 1224  .  fluticasone (FLONASE) 50 MCG/ACT nasal spray 2 spray  2 spray Each Nare Daily Masey Scheiber T, MD   2 spray at 02/20/19 0900  . haloperidol (HALDOL) tablet 10 mg  10 mg Oral BID Beulah Matusek T, MD   10 mg at 02/20/19 0900  . haloperidol decanoate (HALDOL DECANOATE) 100 MG/ML injection 200 mg  200 mg Intramuscular Q30 days Money, Earlysvilleravis B, FNP   200 mg at 02/17/19 1644  . hydrOXYzine (ATARAX/VISTARIL) tablet 50 mg  50 mg Oral TID PRN Money, Gerlene Burdockravis B, FNP   50 mg at 02/19/19 2007  . magnesium hydroxide (MILK OF MAGNESIA) suspension 30 mL  30 mL Oral Daily PRN Kilie Rund T, MD      . naproxen (NAPROSYN) tablet 500 mg  500 mg Oral BID WC Money, Feliz Beamravis B, FNP   500 mg at 02/20/19 0900  . OLANZapine zydis (ZYPREXA) disintegrating tablet 10  mg  10 mg Oral Q8H PRN Antonieta Pertlary, Greg Lawson, MD   10 mg at 02/19/19 2007   And  . ziprasidone (GEODON) injection 20 mg  20 mg Intramuscular PRN Antonieta Pertlary, Greg Lawson, MD      . traZODone (DESYREL) tablet 100 mg  100 mg Oral QHS PRN Antonieta Pertlary, Greg Lawson, MD   100 mg at 02/19/19 2012    Lab Results: No results found for this or any previous visit (from the past 48 hour(s)).  Blood Alcohol level:  Lab Results  Component Value Date   ETH <10 02/12/2019   ETH <10 02/10/2019    Metabolic Disorder Labs: No results found for: HGBA1C, MPG No results found for: PROLACTIN Lab Results  Component Value Date   CHOL 137 02/14/2019   TRIG 42 02/14/2019   HDL 56 02/14/2019   CHOLHDL 2.4 02/14/2019   VLDL 8 02/14/2019   LDLCALC 73 02/14/2019    Physical Findings: AIMS:  , ,  ,  ,    CIWA:    COWS:     Musculoskeletal: Strength & Muscle Tone: within normal limits Gait & Station: normal Patient leans: N/A  Psychiatric Specialty Exam: Physical Exam  Nursing note and vitals reviewed. Constitutional: He appears well-developed and well-nourished.  HENT:  Head: Normocephalic and atraumatic.  Eyes: Pupils are equal, round, and reactive to light. Conjunctivae are normal.  Neck: Normal range of motion.  Cardiovascular: Regular rhythm and normal heart sounds.  Respiratory: Effort normal. No respiratory distress.  GI: Soft.  Musculoskeletal: Normal range of motion.  Neurological: He is alert.  Skin: Skin is warm and dry.  Psychiatric: His speech is normal. His mood appears anxious. He is agitated. He is not aggressive. Thought content is not paranoid. Cognition and memory are impaired. He expresses inappropriate judgment. He expresses no homicidal and no suicidal ideation.    Review of Systems  Constitutional: Negative.   HENT: Negative.   Eyes: Negative.   Respiratory: Negative.   Cardiovascular: Negative.   Gastrointestinal: Negative.   Musculoskeletal: Negative.   Skin: Negative.    Neurological: Negative.   Psychiatric/Behavioral: Negative for depression, hallucinations, substance abuse and suicidal ideas. The patient is not nervous/anxious and does not have insomnia.     Blood pressure 115/78, pulse (!) 48, temperature 98 F (36.7 C), temperature source Oral, resp. rate 18, height 6' (1.829 m), weight 127 kg, SpO2 96 %.Body mass index is 37.97 kg/m.  General Appearance: Casual  Eye Contact:  Fair  Speech:  Slow  Volume:  Decreased  Mood:  Dysphoric  Affect:  Congruent  Thought Process:  Coherent  Orientation:  Full (Time, Place, and Person)  Thought Content:  Rumination and Tangential  Suicidal Thoughts:  No  Homicidal Thoughts:  No  Memory:  Immediate;   Fair Recent;   Fair Remote;   Fair  Judgement:  Fair  Insight:  Fair  Psychomotor Activity:  Normal  Concentration:  Concentration: Fair  Recall:  [employer] of Knowledge:  Fair  Language:  Fair  Akathisia:  No  Handed:  Right  AIMS (if indicated):     Assets:  Desire for Improvement Housing  ADL's:  Impaired  Cognition:  Impaired,  Mild  Sleep:  Number of Hours: 5.3     Treatment Plan Summary: Daily contact with patient to assess and evaluate symptoms and progress in treatment, Medication management and Plan Certainly seems to have improved with the haloperidol.  Still a little manic and Insight still limited.  Not violent not threatening.  Did some supportive counseling and encouragement with him.  Discussed medication.  Social work is still having a difficult time determining what his benefits are which limits our ability to find him a place to live.  He is otherwise patient right now and actually seems to be benefiting from groups.  Mordecai Rasmussen, MD 02/20/2019, 12:23 PM

## 2019-02-20 NOTE — BHH Counselor (Signed)
CSW reached out to [employer], payee/cousin, [phone] to determine if patient is receiving funding that can be utilized for his group home placement. CSW left HIPAA compliant voicemail.  Assunta Curtis, MSW, LCSW 02/20/2019 10:52 AM

## 2019-02-21 DIAGNOSIS — F203 Undifferentiated schizophrenia: Secondary | ICD-10-CM | POA: Diagnosis not present

## 2019-02-21 NOTE — Progress Notes (Signed)
[facility] MD Progress Note  02/21/2019 11:48 AM Eddie Holland  MRN:  [id number]   Subjective:  Follow-up for this patient with schizophrenia.  Patient reports that he feels that he is doing better today.  Patient denies any suicidal homicidal ideations and denies any visual hallucinations.  Patient states that he still continues to have auditory hallucinations but they are not command and he can hear them "a little bit."  Patient states that he is started refusing to take his oral Haldol because he received the injection and he is not going to take the oral pills anymore.  He states that the pills make him have worsening hallucinations.  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  Patient then starts telling the story about his family and how they were persecuted and had to move out of [place] and move up [place] in [place] and the patient would not stop talking for me to discuss other options with him.  Once patient finished then he listened but was not interested in any of the other information I had.  Principal Problem: Schizophrenia ([place]) Diagnosis: Principal Problem:   Schizophrenia ([place])  Total Time spent with patient: 20 minutes  Past Psychiatric History: History of chronic psychotic disorder multiple hospitalizations  Past Medical History:  Past Medical History:  Diagnosis Date  . Asthma   . Back pain   . Paranoia [facility])     Past Surgical History:  Procedure Laterality Date  . CHOLECYSTECTOMY     Family History: History reviewed. No pertinent family history. Family Psychiatric  History: See previous Social History:  Social History   Substance and Sexual Activity  Alcohol Use No     Social History   Substance and Sexual Activity  Drug Use No    Social History   Socioeconomic History  .  Marital status: Married    Spouse name: Not on file  . Number of children: Not on file  . Years of education: Not on file  . Highest education level: Not on file  Occupational History  . Not on file  Social Needs  . Financial resource strain: Patient refused  . Food insecurity    Worry: Patient refused    Inability: Patient refused  . Transportation needs    Medical: Patient refused    Non-medical: Patient refused  Tobacco Use  . Smoking status: Never Smoker  . Smokeless tobacco: Never Used  Substance and Sexual Activity  . Alcohol use: No  . Drug use: No  . Sexual activity: Not Currently  Lifestyle  . Physical activity    Days per week: Patient refused    Minutes per session: Patient refused  . Stress: Patient refused  Relationships  . Social Herbalist on phone: Patient refused    Gets together: Patient refused    Attends religious service: Patient refused    Active member of club or organization: Patient refused    Attends meetings of clubs or organizations: Patient refused    Relationship status: Patient refused  Other Topics Concern  . Not on file  Social History Narrative  . Not on file   Additional Social History:  Sleep: Good  Appetite:  Good  Current Medications: Current Facility-Administered Medications  Medication Dose Route Frequency Provider Last Rate Last Dose  . acetaminophen (TYLENOL) tablet 650 mg  650 mg Oral Q6H PRN Clapacs, John T, MD      . alum & mag hydroxide-simeth (MAALOX/MYLANTA) 200-200-20 MG/5ML suspension 30 mL  30 mL Oral Q4H PRN Clapacs, Jackquline DenmarkJohn T, MD   30 mL at 02/20/19 2048  . fluticasone (FLONASE) 50 MCG/ACT nasal spray 2 spray  2 spray Each Nare Daily Clapacs, John T, MD   2 spray at 02/20/19 0900  . haloperidol (HALDOL) tablet 10 mg  10 mg Oral BID Clapacs, John T, MD   10 mg at 02/20/19 0900  . haloperidol decanoate (HALDOL DECANOATE) 100 MG/ML injection 200 mg  200 mg Intramuscular Q30  days , Meromravis B, FNP   200 mg at 02/17/19 1644  . hydrOXYzine (ATARAX/VISTARIL) tablet 50 mg  50 mg Oral TID PRN , Gerlene Burdockravis B, FNP   50 mg at 02/19/19 2007  . magnesium hydroxide (MILK OF MAGNESIA) suspension 30 mL  30 mL Oral Daily PRN Clapacs, John T, MD      . naproxen (NAPROSYN) tablet 500 mg  500 mg Oral BID WC , Feliz Beamravis B, FNP   500 mg at 02/20/19 0900  . OLANZapine zydis (ZYPREXA) disintegrating tablet 10 mg  10 mg Oral Q8H PRN Antonieta Pertlary, Greg Lawson, MD   10 mg at 02/19/19 2007   And  . ziprasidone (GEODON) injection 20 mg  20 mg Intramuscular PRN Antonieta Pertlary, Greg Lawson, MD      . traZODone (DESYREL) tablet 100 mg  100 mg Oral QHS PRN Antonieta Pertlary, Greg Lawson, MD   100 mg at 02/19/19 2012    Lab Results: No results found for this or any previous visit (from the past 48 hour(s)).  Blood Alcohol level:  Lab Results  Component Value Date   ETH <10 02/12/2019   ETH <10 02/10/2019    Metabolic Disorder Labs: No results found for: HGBA1C, MPG No results found for: PROLACTIN Lab Results  Component Value Date   CHOL 137 02/14/2019   TRIG 42 02/14/2019   HDL 56 02/14/2019   CHOLHDL 2.4 02/14/2019   VLDL 8 02/14/2019   LDLCALC 73 02/14/2019    Physical Findings: AIMS:  , ,  ,  ,    CIWA:    COWS:     Musculoskeletal: Strength & Muscle Tone: within normal limits Gait & Station: normal Patient leans: N/A  Psychiatric Specialty Exam: Physical Exam  Nursing note and vitals reviewed. Constitutional: He is oriented to person, place, and time. He appears well-developed and well-nourished.  Cardiovascular: Normal rate.  Respiratory: Effort normal.  Musculoskeletal: Normal range of motion.  Neurological: He is alert and oriented to person, place, and time.  Skin: Skin is warm.    Review of Systems  Constitutional: Negative.   HENT: Negative.   Eyes: Negative.   Respiratory: Negative.   Cardiovascular: Negative.   Gastrointestinal: Negative.   Genitourinary: Negative.    Musculoskeletal: Negative.   Skin: Negative.   Neurological: Negative.   Endo/Heme/Allergies: Negative.   Psychiatric/Behavioral: Positive for hallucinations.    Blood pressure 125/90, pulse 74, temperature (!) 97.4 F (36.3 C), temperature source Oral, resp. rate 17, height 6' (1.829 m), weight 127 kg, SpO2 96 %.Body mass index is 37.97 kg/m.  General Appearance: Casual  Eye Contact:  Good  Speech:  Clear and Coherent and Pressured  Volume:  Normal  Mood:  Euthymic  Affect:  Constricted  Thought Process:  Disorganized and Descriptions of Associations: Tangential  Orientation:  Full (Time, Place, and Person)  Thought Content:  Hallucinations: Auditory and Tangential  Suicidal Thoughts:  No  Homicidal Thoughts:  No  Memory:  Immediate;   Good Recent;   Good Remote;   Good  Judgement:  Impaired  Insight:  Lacking  Psychomotor Activity:  Normal  Concentration:  Concentration: Fair  Recall:  Good  Fund of Knowledge:  Fair  Language:  Fair  Akathisia:  No  Handed:  Right  AIMS (if indicated):     Assets:  Desire for Improvement Financial Resources/Insurance Resilience  ADL's:  Intact  Cognition:  WNL  Sleep:  Number of Hours: 0   Assessment: Patient continues to have disorganized thought process as well as pressured speech at times.  Patient's judgment seems to be poor as he is deciding that he should not take medications orally because they are worsening hallucinations and they are not the green pill as he had stated.  Patient's speech continues to be pressured at times which makes it difficult to carry on a conversation with him.  At other times of seeing the patient has refused to talk and will only point for nod or shake his head and make grunting noises when asked questions.  Patient was refused at the group home and outpatient states that he cannot go to a shelter because he now cannot function there and he cannot go to [employer] because he cannot work the  required hours they ask for. He seems to have continued paranoid thoughts about medication and persecution. Will continue current medications as patient has refused to take any other medications other than Haldol and is now refusing the oral medication since he received the IM injection of Haldol.   Treatment Plan Summary: Daily contact with patient to assess and evaluate symptoms and progress in treatment and Medication management  Continue Haldol Decanoate 200 mg IM Q30 days last dose on 02/12/2019 Continue to encourage Haldol PO 10 mg PO BID Continue Naproxen 500 mg PO BID with meals for back and leg pain Continue Trazodone 100 mg PO QHS PRN for insomnia Encourage group therapy participation Continue Q15 minute safety checks   Maryfrances Bunnell, FNP 02/21/2019, 11:48 AM

## 2019-02-21 NOTE — Plan of Care (Signed)
Patient was little irritable and not willing to take any medicines this morning.States "I had the injection Haldol I don't want the pills anymore."Patient stated that he is not going to the shelter.Denies SI,HI and the voices are not bothering him a lot.No aggressive behaviors noted.Attended groups.Support and encouragement given.

## 2019-02-21 NOTE — Progress Notes (Signed)
Recreation Therapy Notes  Date: 02/21/2019  Time: 9:30 am  Location: Craft room  Behavioral response: Appropriate   Intervention Topic: Leisure  Discussion/Intervention:  Group content today was focused on leisure. The group defined what leisure is and some positive leisure activities they participate in. Individuals identified the difference between good and bad leisure. Participants expressed how they feel after participating in the leisure of their choice. The group discussed how they go about picking a leisure activity and if others are involved in their leisure activities. The patient stated how many leisure activities they too choose from and reasons why it is important to have leisure time. Individuals participated in the intervention "Exploration of Leisure" where they had a chance to identify new leisure activities as well as benefits of leisure. Clinical Observations/Feedback:  Patient came to group and stared at peers and staff while responding to internal stimuli. He did not provide any positive contributions towards group. Byrdie Miyazaki LRT/CTRS         Terrel Nesheiwat 02/21/2019 11:39 AM

## 2019-02-21 NOTE — Progress Notes (Signed)
D - Patient was in the day room upon arrival to the unit. Patient was pleasant during assessment denying SI/HI,AVH pain, anxiety and depression with this Probation officer. Patient is having some disorganized thinking. Patient came up to the nurses station at 0200 speaking incoherently. Patient informed if he didn't quite down he would be getting medication, patient went back to his room.    A - Patient didn't have any medications scheduled this evening. Patient given education. Patient given support and encouragement to be active in his treatment plan. Patient informed to let staff know if there are any issues or problems on the unit.   R - Patient being monitored Q 15 minutes for safety per unit protocol. Patient remains safe on the unit.

## 2019-02-21 NOTE — Plan of Care (Signed)
Patient was redirectable when being disruptive on the unit tonight.   Problem: Education: Goal: Emotional status will improve Outcome: Not Progressing Goal: Mental status will improve Outcome: Not Progressing

## 2019-02-22 DIAGNOSIS — F203 Undifferentiated schizophrenia: Secondary | ICD-10-CM | POA: Diagnosis not present

## 2019-02-22 NOTE — Progress Notes (Signed)
Patient is becoming increasingly agitated and verbally argumentative with staff. Patient is witnessed balling up fists and making verbal threats to nursing staff.

## 2019-02-22 NOTE — Plan of Care (Signed)
Patient has had multiple episodes of aggressive behavior, is constantly loud and agitating peers. Patient has on occasion threatened staff and has made physical postures towards staff. Patient frequently refuses oral medications. Patients safety is maintained on unit.    Problem: Education: Goal: Knowledge of [place] Education information/materials will improve Outcome: Not Progressing Goal: Emotional status will improve Outcome: Not Progressing Goal: Mental status will improve Outcome: Not Progressing

## 2019-02-22 NOTE — BHH Group Notes (Addendum)
LCSW Group Therapy Note  Date/Time:  02/22/2019   1:00PM  Type of Therapy and Topic:  Group Therapy:  Healthy vs Unhealthy Coping Skills  Participation Level:  Active   Description of Group:  The focus of this group was to determine what unhealthy coping techniques typically are used by group members and what healthy coping techniques would be helpful in coping with various problems. Patients were guided in becoming aware of the differences between healthy and unhealthy coping techniques.  Patients were asked to identify 1 unhealthy coping skill they used prior to this hospitalization. Patients were then asked to identify 1-2 healthy coping skills they like to use, and many mentioned listening to music, coloring and taking a hot shower. These were further explored on how to implement them more effectively after discharge.   At the end of group, additional ideas of healthy coping skills were shared in discussion.   Therapeutic Goals 1. Patients learned that coping is what human beings do all day long to deal with various situations in their lives 2. Patients defined and discussed healthy vs unhealthy coping techniques 3. Patients identified their preferred coping techniques and identified whether these were healthy or unhealthy 4. Patients determined 1-2 healthy coping skills they would like to become more familiar with and use more often, and practiced a few meditations 5. Patients provided support and ideas to each other  Summary of Patient Progress: During group, patients defined coping skills and identified the difference between healthy and unhealthy coping skills. Patients were asked to identify the unhealthy coping skills they used that caused them to have to be hospitalized. Patients were then asked to discuss the alternate healthy coping skills that they could use in place of the healthy coping skill whenever they return home. Patient attended about 30 minutes of group, during which time  he was very intrusive. He participated in group discussion. He identified healthy and unhealthy coping skills. Patient left group and did not return.    Therapeutic Modalities Cognitive Behavioral Therapy Motivational Interviewing Solution Focused Therapy Brief Therapy    Netta Neat, MSW, LCSW Clinical Social Work 02/22/2019

## 2019-02-22 NOTE — Progress Notes (Signed)
[facility]  02/22/2019 3:03 PM Eddie Holland  MRN:  [id number] Subjective: Follow-up for this patient with schizophrenia.  He is intermittently agitated on the unit.  He can stay calm for periods of time but then gets loud and aggressive when he is thwarted even in minor ways such as not having his music played outside.  Patient will posture at times but has not been actually violent to anyone.  Still cooperative with medicine.  Appears to be delusional and disorganized not clear how far off his baseline he is.  No physical complaints. Principal Problem: Schizophrenia (HCC) Diagnosis: Principal Problem:   Schizophrenia (HCC)  Total Time spent with patient: 30 minutes  Past Psychiatric History: Past history of schizophrenia a long history of multiple hospitalizations recently seems to have decompensated in the last few years  Past Medical History:  Past Medical History:  Diagnosis Date  . Asthma   . Back pain   . Paranoia [facility](HCC)     Past Surgical History:  Procedure Laterality Date  . CHOLECYSTECTOMY     Family History: History reviewed. No pertinent family history. Family Psychiatric  History: See previous Social History:  Social History   Substance and Sexual Activity  Alcohol Use No     Social History   Substance and Sexual Activity  Drug Use No    Social History   Socioeconomic History  . Marital status: Married    Spouse name: Not on file  . Number of children: Not on file  . Years of education: Not on file  . Highest education level: Not on file  Occupational History  . Not on file  Social Needs  . Financial resource strain: Patient refused  . Food insecurity    Worry: Patient refused    Inability: Patient refused  . Transportation needs    Medical: Patient refused    Non-medical: Patient refused  Tobacco Use  . Smoking status: Never Smoker  . Smokeless tobacco: Never Used  Substance and Sexual Activity  . Alcohol use: No  . Drug use: No  .  Sexual activity: Not Currently  Lifestyle  . Physical activity    Days per week: Patient refused    Minutes per session: Patient refused  . Stress: Patient refused  Relationships  . Social Musicianconnections    Talks on phone: Patient refused    Gets together: Patient refused    Attends religious service: Patient refused    Active member of club or organization: Patient refused    Attends meetings of clubs or organizations: Patient refused    Relationship status: Patient refused  Other Topics Concern  . Not on file  Social History Narrative  . Not on file   Additional Social History:                         Sleep: Fair  Appetite:  Fair  Current Medications: Current Facility-Administered Medications  Medication Dose Route Frequency Provider Last Rate Last Dose  . acetaminophen (TYLENOL) tablet 650 mg  650 mg Oral Q6H PRN Momen Ham T, MD      . alum & mag hydroxide-simeth (MAALOX/MYLANTA) 200-200-20 MG/5ML suspension 30 mL  30 mL Oral Q4H PRN Krisi Azua, Jackquline DenmarkJohn T, MD   30 mL at 02/20/19 2048  . fluticasone (FLONASE) 50 MCG/ACT nasal spray 2 spray  2 spray Each Nare Daily Lavon Horn T, MD   2 spray at 02/20/19 0900  . haloperidol (HALDOL) tablet 10 mg  10 mg Oral BID Shahara Hartsfield T, MD   10 mg at 02/20/19 0900  . haloperidol decanoate (HALDOL DECANOATE) 100 MG/ML injection 200 mg  200 mg Intramuscular Q30 days Money, Gerlene Burdock, FNP   200 mg at 02/17/19 1644  . hydrOXYzine (ATARAX/VISTARIL) tablet 50 mg  50 mg Oral TID PRN Money, Gerlene Burdock, FNP   50 mg at 02/19/19 2007  . magnesium hydroxide (MILK OF MAGNESIA) suspension 30 mL  30 mL Oral Daily PRN Abdias Hickam T, MD      . naproxen (NAPROSYN) tablet 500 mg  500 mg Oral BID WC Money, Feliz Beam B, FNP   500 mg at 02/22/19 0837  . OLANZapine zydis (ZYPREXA) disintegrating tablet 10 mg  10 mg Oral Q8H PRN Antonieta Pert, MD   10 mg at 02/19/19 2007   And  . ziprasidone (GEODON) injection 20 mg  20 mg Intramuscular PRN Antonieta Pert, MD      . traZODone (DESYREL) tablet 100 mg  100 mg Oral QHS PRN Antonieta Pert, MD   100 mg at 02/19/19 2012    Lab Results: No results found for this or any previous visit (from the past 48 hour(s)).  Blood Alcohol level:  Lab Results  Component Value Date   ETH <10 02/12/2019   ETH <10 02/10/2019    Metabolic Disorder Labs: No results found for: HGBA1C, MPG No results found for: PROLACTIN Lab Results  Component Value Date   CHOL 137 02/14/2019   TRIG 42 02/14/2019   HDL 56 02/14/2019   CHOLHDL 2.4 02/14/2019   VLDL 8 02/14/2019   LDLCALC 73 02/14/2019    Physical Findings: AIMS:  , ,  ,  ,    CIWA:    COWS:     Musculoskeletal: Strength & Muscle Tone: within normal limits Gait & Station: normal Patient leans: N/A  Psychiatric Specialty Exam: Physical Exam  Nursing note and vitals reviewed. Constitutional: He appears well-developed and well-nourished.  HENT:  Head: Normocephalic and atraumatic.  Eyes: Pupils are equal, round, and reactive to light. Conjunctivae are normal.  Neck: Normal range of motion.  Cardiovascular: Regular rhythm and normal heart sounds.  Respiratory: Effort normal. No respiratory distress.  GI: Soft.  Musculoskeletal: Normal range of motion.  Neurological: He is alert.  Skin: Skin is warm and dry.  Psychiatric: His affect is labile. His speech is tangential. He is agitated. He is not aggressive. Thought content is paranoid. Cognition and memory are impaired. He expresses impulsivity. He expresses no homicidal and no suicidal ideation.    Review of Systems  Constitutional: Negative.   HENT: Negative.   Eyes: Negative.   Respiratory: Negative.   Cardiovascular: Negative.   Gastrointestinal: Negative.   Musculoskeletal: Negative.   Skin: Negative.   Neurological: Negative.   Psychiatric/Behavioral: Positive for hallucinations. Negative for depression, memory loss, substance abuse and suicidal ideas. The patient is  nervous/anxious. The patient does not have insomnia.     Blood pressure 125/90, pulse 74, temperature (!) 97.4 F (36.3 C), temperature source Oral, resp. rate 17, height 6' (1.829 m), weight 127 kg, SpO2 96 %.Body mass index is 37.97 kg/m.  General Appearance: Disheveled  Eye Contact:  Fair  Speech:  Pressured  Volume:  Increased  Mood:  Irritable  Affect:  Congruent  Thought Process:  Disorganized  Orientation:  Full (Time, Place, and Person)  Thought Content:  Illogical, Paranoid Ideation and Rumination  Suicidal Thoughts:  No  Homicidal Thoughts:  No  Memory:  Immediate;   Fair Recent;   Fair Remote;   Fair  Judgement:  Impaired  Insight:  Shallow  Psychomotor Activity:  Restlessness  Concentration:  Concentration: Fair  Recall:  [employer] of Knowledge:  Fair  Language:  Fair  Akathisia:  No  Handed:  Right  AIMS (if indicated):     Assets:  Desire for Improvement Housing Physical Health  ADL's:  Impaired  Cognition:  Impaired,  Mild  Sleep:  Number of Hours: 5.5     Treatment Plan Summary: Daily contact with patient to assess and evaluate symptoms and progress in treatment, Medication management and Plan Patient still irritable but mostly controllable.  He is on pretty solid doses of Haldol and for the most part cooperates.  We may need to continue titrating medicine up if his behavior remains difficult.  Psychoeducation with the patient and supportive counseling about trying to keep himself under better control.  No change to overall treatment plan.  We are still looking for some kind of disposition plan to,.  Alethia Berthold, MD 02/22/2019, 3:03 PM

## 2019-02-22 NOTE — Progress Notes (Signed)
Patient ia alert and oriented x 3 thoughts are dorganized at times to situation, he appears less irritable, interacting with peers and staff, no disruptive behavior on unit , no sexual comments or bizarre behavior 15 minutes safety checks maintained will continue to monitor

## 2019-02-23 DIAGNOSIS — F203 Undifferentiated schizophrenia: Secondary | ICD-10-CM | POA: Diagnosis not present

## 2019-02-23 NOTE — Progress Notes (Signed)
Patient refused his Flonase and Haldol this morning. Patient stated that the Flonase makes his nose run more and he also stated that he was not taking the Haldol pill because he already had the shot. MD will be notified.
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
## 2019-02-23 NOTE — Progress Notes (Signed)
[facility] MD Progress Note  02/23/2019 3:39 PM Eddie Holland  MRN:  [id number] Subjective: Follow-up for this gentleman with schizophrenia.  His behavior today is pretty much the same as it has been for the last few days.  He is not overly disruptive although he can easily become agitated on the unit.  Has not been threatening or aggressive to any specific person.  Affect is a little confrontational but not hostile exactly.  I talked with him about his refusal of oral Haldol and he got animated did not want to have any conversation about it and said he was not going to take it because now he had the shot.  I tried to explain that it takes a while for the shot to start working but he just shouted no at me until I stopped talking and that ended the conversation.  Patient has no specific requests otherwise. Principal Problem: Schizophrenia ([place]) Diagnosis: Principal Problem:   Schizophrenia ([place])  Total Time spent with patient: 30 minutes  Past Psychiatric History: Past history of schizophrenia longstanding chronic mental illness  Past Medical History:  Past Medical History:  Diagnosis Date  . Asthma   . Back pain   . Paranoia [facility])     Past Surgical History:  Procedure Laterality Date  . CHOLECYSTECTOMY     Family History: History reviewed. No pertinent family history. Family Psychiatric  History: See previous Social History:  Social History   Substance and Sexual Activity  Alcohol Use No     Social History   Substance and Sexual Activity  Drug Use No    Social History   Socioeconomic History  . Marital status: Married    Spouse name: Not on file  . Number of children: Not on file  . Years of education: Not on file  . Highest education level: Not on file  Occupational History  . Not on file  Social Needs  . Financial resource strain: Patient refused  . Food insecurity    Worry: Patient refused    Inability: Patient refused  . Transportation needs    Medical: Patient  refused    Non-medical: Patient refused  Tobacco Use  . Smoking status: Never Smoker  . Smokeless tobacco: Never Used  Substance and Sexual Activity  . Alcohol use: No  . Drug use: No  . Sexual activity: Not Currently  Lifestyle  . Physical activity    Days per week: Patient refused    Minutes per session: Patient refused  . Stress: Patient refused  Relationships  . Social Herbalist on phone: Patient refused    Gets together: Patient refused    Attends religious service: Patient refused    Active member of club or organization: Patient refused    Attends meetings of clubs or organizations: Patient refused    Relationship status: Patient refused  Other Topics Concern  . Not on file  Social History Narrative  . Not on file   Additional Social History:                         Sleep: Fair  Appetite:  Fair  Current Medications: Current Facility-Administered Medications  Medication Dose Route Frequency Provider Last Rate Last Dose  . acetaminophen (TYLENOL) tablet 650 mg  650 mg Oral Q6H PRN Clapacs, John T, MD      . alum & mag hydroxide-simeth (MAALOX/MYLANTA) 200-200-20 MG/5ML suspension 30 mL  30 mL Oral Q4H PRN Clapacs, John T,  MD   30 mL at 02/20/19 2048  . fluticasone (FLONASE) 50 MCG/ACT nasal spray 2 spray  2 spray Each Nare Daily Clapacs, John T, MD   2 spray at 02/20/19 0900  . haloperidol (HALDOL) tablet 10 mg  10 mg Oral BID Clapacs, John T, MD   10 mg at 02/20/19 0900  . haloperidol decanoate (HALDOL DECANOATE) 100 MG/ML injection 200 mg  200 mg Intramuscular Q30 days Money, [place] B, FNP   200 mg at 02/17/19 1644  . hydrOXYzine (ATARAX/VISTARIL) tablet 50 mg  50 mg Oral TID PRN Money, Gerlene Burdock, FNP   50 mg at 02/19/19 2007  . magnesium hydroxide (MILK OF MAGNESIA) suspension 30 mL  30 mL Oral Daily PRN Clapacs, John T, MD      . naproxen (NAPROSYN) tablet 500 mg  500 mg Oral BID WC Money, Feliz Beam B, FNP   500 mg at 02/23/19 0853  . OLANZapine  zydis (ZYPREXA) disintegrating tablet 10 mg  10 mg Oral Q8H PRN Antonieta Pert, MD   10 mg at 02/19/19 2007   And  . ziprasidone (GEODON) injection 20 mg  20 mg Intramuscular PRN Antonieta Pert, MD      . traZODone (DESYREL) tablet 100 mg  100 mg Oral QHS PRN Antonieta Pert, MD   100 mg at 02/19/19 2012    Lab Results: No results found for this or any previous visit (from the past 48 hour(s)).  Blood Alcohol level:  Lab Results  Component Value Date   ETH <10 02/12/2019   ETH <10 02/10/2019    Metabolic Disorder Labs: No results found for: HGBA1C, MPG No results found for: PROLACTIN Lab Results  Component Value Date   CHOL 137 02/14/2019   TRIG 42 02/14/2019   HDL 56 02/14/2019   CHOLHDL 2.4 02/14/2019   VLDL 8 02/14/2019   LDLCALC 73 02/14/2019    Physical Findings: AIMS:  , ,  ,  ,    CIWA:    COWS:     Musculoskeletal: Strength & Muscle Tone: within normal limits Gait & Station: normal Patient leans: N/A  Psychiatric Specialty Exam: Physical Exam  Nursing note and vitals reviewed. Constitutional: He appears well-developed and well-nourished.  HENT:  Head: Normocephalic and atraumatic.  Eyes: Pupils are equal, round, and reactive to light. Conjunctivae are normal.  Neck: Normal range of motion.  Cardiovascular: Regular rhythm and normal heart sounds.  Respiratory: Effort normal.  GI: Soft.  Musculoskeletal: Normal range of motion.  Neurological: He is alert.  Skin: Skin is warm and dry.  Psychiatric: His affect is labile. His speech is tangential. He is agitated. He is not aggressive. Thought content is delusional. Cognition and memory are impaired. He expresses impulsivity. He expresses no homicidal and no suicidal ideation.    Review of Systems  Constitutional: Negative.   HENT: Negative.   Eyes: Negative.   Respiratory: Negative.   Cardiovascular: Negative.   Gastrointestinal: Negative.   Musculoskeletal: Negative.   Skin: Negative.    Neurological: Negative.   Psychiatric/Behavioral: Positive for hallucinations. Negative for depression, substance abuse and suicidal ideas. The patient is not nervous/anxious and does not have insomnia.     Blood pressure 117/89, pulse 73, temperature 97.7 F (36.5 C), temperature source Oral, resp. rate 18, height 6' (1.829 m), weight 127 kg, SpO2 99 %.Body mass index is 37.97 kg/m.  General Appearance: Casual  Eye Contact:  Good  Speech:  Pressured  Volume:  Normal  Mood:  Euthymic  Affect:  Labile  Thought Process:  Disorganized  Orientation:  Full (Time, Place, and Person)  Thought Content:  Delusions, Hallucinations: Auditory and Paranoid Ideation  Suicidal Thoughts:  No  Homicidal Thoughts:  No  Memory:  Negative  Judgement:  Impaired  Insight:  Shallow  Psychomotor Activity:  Restlessness  Concentration:  Concentration: Poor  Recall:  Fair  Fund of Knowledge:  Fair  Language:  Fair  Akathisia:  No  Handed:  Right  AIMS (if indicated):     Assets:  Desire for Improvement Resilience  ADL's:  Intact  Cognition:  Impaired,  Mild  Sleep:  Number of Hours: 5.25     Treatment Plan Summary: Daily contact with patient to assess and evaluate symptoms and progress in treatment, Medication management and Plan Steadfastly refusing now to take oral medicine.  He does have the high-dose Haldol decanoate shot on board.  As long as he is not getting more disruptive we will not force extra medicines.  I am very much hoping that we can find some kind of placement plan for this gentleman sooner rather than later as he is probably at or near his baseline.  Mordecai RasmussenJohn Clapacs, MD 02/23/2019, 3:39 PM

## 2019-02-23 NOTE — [facility] Group Notes (Signed)
LCSW Group Therapy Note 02/23/2019 1:15pm  Type of Therapy and Topic: Group Therapy: Feelings Around Returning Home & Establishing a Supportive Framework and Supporting Oneself When Supports Not Available  Participation Level: Active  Description of Group:  Patients first processed thoughts and feelings about upcoming discharge. These included fears of upcoming changes, lack of change, new living environments, judgements and expectations from others and overall stigma of mental health issues. The group then discussed the definition of a supportive framework, what that looks and feels like, and how do to discern it from an unhealthy non-supportive network. The group identified different types of supports as well as what to do when your family/friends are less than helpful or unavailable  Therapeutic Goals  1. Patient will identify one healthy supportive network that they can use at discharge. 2. Patient will identify one factor of a supportive framework and how to tell it from an unhealthy network. 3. Patient able to identify one coping skill to use when they do not have positive supports from others. 4. Patient will demonstrate ability to communicate their needs through discussion and/or role plays.  Summary of Patient Progress:  Patient scored his mood at a 10 (10 best.) Pt engaged during group session. As patients processed their anxiety about discharge and described healthy supports patient shared he is ready to be discharge, however it is not up to him to be discharge it is up to the doctor. Patients identified at least one self-care tool they were willing to use after discharge.   Therapeutic Modalities Cognitive Behavioral Therapy Motivational Interviewing   Nour Scalise  CUEBAS-COLON, LCSW 02/23/2019 10:24 AM

## 2019-02-23 NOTE — Plan of Care (Signed)
D- Patient alert and oriented. Patient presents in an agitated/paranoid mood, refusing to take medications because he feels as though he doesn't need them, stating "I already got the shot", and he also feels that everyone here is against/out to get him. Patient denies SI, HI, AVH, and pain at this time. Patient also denies any signs/symptoms of depression/anxiety on his self-inventory. Patient's goal for today is "money".  A- Support and encouragement provided.  Routine safety checks conducted every 15 minutes. Patient informed to notify staff with problems or concerns.  R- Patient contracts for safety at this time. Patient remains safe at this time.  Problem: Education: Goal: Knowledge of [place] [facility] Education information/materials will improve 02/23/2019 1704 by Lyda Kalata, RN Outcome: Not Progressing 02/23/2019 1502 by Lyda Kalata, RN Outcome: Progressing Goal: Emotional status will improve 02/23/2019 1704 by Lyda Kalata, RN Outcome: Not Progressing 02/23/2019 1502 by Lyda Kalata, RN Outcome: Progressing Goal: Mental status will improve 02/23/2019 1704 by Lyda Kalata, RN Outcome: Not Progressing 02/23/2019 1502 by Lyda Kalata, RN Outcome: Progressing Goal: Verbalization of understanding the information provided will improve 02/23/2019 1704 by Lyda Kalata, RN Outcome: Not Progressing 02/23/2019 1502 by Lyda Kalata, RN Outcome: Progressing   Problem: Activity: Goal: Interest or engagement in activities will improve 02/23/2019 1704 by Lyda Kalata, RN Outcome: Not Progressing 02/23/2019 1502 by Lyda Kalata, RN Outcome: Progressing Goal: Sleeping patterns will improve 02/23/2019 1704 by Lyda Kalata, RN Outcome: Not Progressing 02/23/2019 1502 by Lyda Kalata, RN Outcome: Progressing   Problem: Coping: Goal: Ability to verbalize frustrations and anger appropriately will  improve 02/23/2019 1704 by Lyda Kalata, RN Outcome: Not Progressing 02/23/2019 1502 by Lyda Kalata, RN Outcome: Progressing Goal: Ability to demonstrate self-control will improve 02/23/2019 1704 by Lyda Kalata, RN Outcome: Not Progressing 02/23/2019 1502 by Lyda Kalata, RN Outcome: Progressing   Problem: Health Behavior/Discharge Planning: Goal: Identification of resources available to assist in meeting health care needs will improve 02/23/2019 1704 by Lyda Kalata, RN Outcome: Not Progressing 02/23/2019 1502 by Lyda Kalata, RN Outcome: Progressing Goal: Compliance with treatment plan for underlying cause of condition will improve 02/23/2019 1704 by Lyda Kalata, RN Outcome: Not Progressing 02/23/2019 1502 by Lyda Kalata, RN Outcome: Progressing   Problem: Physical Regulation: Goal: Ability to maintain clinical measurements within normal limits will improve 02/23/2019 1704 by Lyda Kalata, RN Outcome: Not Progressing 02/23/2019 1502 by Lyda Kalata, RN Outcome: Progressing   Problem: Safety: Goal: Periods of time without injury will increase 02/23/2019 1704 by Lyda Kalata, RN Outcome: Not Progressing 02/23/2019 1502 by Lyda Kalata, RN Outcome: Progressing

## 2019-02-23 NOTE — Progress Notes (Signed)
Patient is laying on his mattress in front of his door, stating "I want to know who all is coming in and out of here tonight".

## 2019-02-24 DIAGNOSIS — F203 Undifferentiated schizophrenia: Secondary | ICD-10-CM | POA: Diagnosis not present

## 2019-02-24 NOTE — Progress Notes (Signed)
D - Patient was in the day room upon arrival to the unit. Patient was pleasant during assessment denying SI/HI,AVH pain, anxiety and depression with this Probation officer. Patient came up to the nurses station several times asking, "Can yall cut my hair?" and "What time is snack?" Patient given education. Patient didn't have any outbursts on the unit this evening.   A - Patient didn't have any medications scheduled this evening. Patient given education. Patient given support and encouragement to be active in his treatment plan. Patient informed to let staff know if there are any issues or problems on the unit.  R - Patient being monitored Q 15 minutes for safety per unit protocol. Patient remains safe on the unit.

## 2019-02-24 NOTE — Progress Notes (Signed)
[facility] MD Progress Note  02/24/2019 12:52 PM Eddie Holland  MRN:  [id number] Subjective: Follow-up with this patient with schizophrenia.  Patient has been agitated and somewhat belligerent with staff and patients.  Intimidating some male patients yesterday with his agitated behavior.  Today he is out in the milieu again and is able to interact relatively appropriately.  He comes in to talk with me and tells me in no uncertain terms he will refuse to take any other medication at this time.  Patient denies suicidal or homicidal thought.  He insists that he wants to be discharged and go to [facility] where he lived previously.  Says he feels certain that he has somebody back there with home who could let him stay with him.  Patient has been able to take care of his basic ADLs here on the unit adequately.  He is not participating appropriately in treatment and is actively resistant to changes in treatment or education about treatment. Principal Problem: Schizophrenia (HCC) Diagnosis: Principal Problem:   Schizophrenia (HCC)  Total Time spent with patient: 30 minutes  Past Psychiatric History: Patient has a long history of schizophrenia going back probably decades with worsening of late  Past Medical History:  Past Medical History:  Diagnosis Date  . Asthma   . Back pain   . Paranoia [facility])     Past Surgical History:  Procedure Laterality Date  . CHOLECYSTECTOMY     Family History: History reviewed. No pertinent family history. Family Psychiatric  History: See previous Social History:  Social History   Substance and Sexual Activity  Alcohol Use No     Social History   Substance and Sexual Activity  Drug Use No    Social History   Socioeconomic History  . Marital status: Married    Spouse name: Not on file  . Number of children: Not on file  . Years of education: Not on file  . Highest education level: Not on file  Occupational History  . Not on file  Social  Needs  . Financial resource strain: Patient refused  . Food insecurity    Worry: Patient refused    Inability: Patient refused  . Transportation needs    Medical: Patient refused    Non-medical: Patient refused  Tobacco Use  . Smoking status: Never Smoker  . Smokeless tobacco: Never Used  Substance and Sexual Activity  . Alcohol use: No  . Drug use: No  . Sexual activity: Not Currently  Lifestyle  . Physical activity    Days per week: Patient refused    Minutes per session: Patient refused  . Stress: Patient refused  Relationships  . Social Musician on phone: Patient refused    Gets together: Patient refused    Attends religious service: Patient refused    Active member of club or organization: Patient refused    Attends meetings of clubs or organizations: Patient refused    Relationship status: Patient refused  Other Topics Concern  . Not on file  Social History Narrative  . Not on file   Additional Social History:                         Sleep: Fair  Appetite:  Fair  Current Medications: Current Facility-Administered Medications  Medication Dose Route Frequency Provider Last Rate Last Dose  . acetaminophen (TYLENOL) tablet 650 mg  650 mg Oral Q6H PRN , Jackquline Denmark, MD      .  alum & mag hydroxide-simeth (MAALOX/MYLANTA) 200-200-20 MG/5ML suspension 30 mL  30 mL Oral Q4H PRN , Madie Reno, MD   30 mL at 02/20/19 2048  . fluticasone (FLONASE) 50 MCG/ACT nasal spray 2 spray  2 spray Each Nare Daily ,  T, MD   2 spray at 02/20/19 0900  . haloperidol (HALDOL) tablet 10 mg  10 mg Oral BID ,  T, MD   10 mg at 02/20/19 0900  . haloperidol decanoate (HALDOL DECANOATE) 100 MG/ML injection 200 mg  200 mg Intramuscular Q30 days Money, [place] B, FNP   200 mg at 02/17/19 1644  . hydrOXYzine (ATARAX/VISTARIL) tablet 50 mg  50 mg Oral TID PRN Money, Lowry Ram, FNP   50 mg at 02/19/19 2007  . magnesium hydroxide (MILK OF MAGNESIA)  suspension 30 mL  30 mL Oral Daily PRN ,  T, MD      . naproxen (NAPROSYN) tablet 500 mg  500 mg Oral BID WC Money, Lowry Ram, FNP   500 mg at 02/24/19 7322  . OLANZapine zydis (ZYPREXA) disintegrating tablet 10 mg  10 mg Oral Q8H PRN Sharma Covert, MD   10 mg at 02/19/19 2007   And  . ziprasidone (GEODON) injection 20 mg  20 mg Intramuscular PRN Sharma Covert, MD      . traZODone (DESYREL) tablet 100 mg  100 mg Oral QHS PRN Sharma Covert, MD   100 mg at 02/19/19 2012    Lab Results: No results found for this or any previous visit (from the past 48 hour(s)).  Blood Alcohol level:  Lab Results  Component Value Date   ETH <10 02/12/2019   ETH <10 02/54/2706    Metabolic Disorder Labs: No results found for: HGBA1C, MPG No results found for: PROLACTIN Lab Results  Component Value Date   CHOL 137 02/14/2019   TRIG 42 02/14/2019   HDL 56 02/14/2019   CHOLHDL 2.4 02/14/2019   VLDL 8 02/14/2019   LDLCALC 73 02/14/2019    Physical Findings: AIMS:  , ,  ,  ,    CIWA:    COWS:     Musculoskeletal: Strength & Muscle Tone: within normal limits Gait & Station: normal Patient leans: N/A  Psychiatric Specialty Exam: Physical Exam  Nursing note and vitals reviewed. Constitutional: He appears well-developed and well-nourished.  HENT:  Head: Normocephalic and atraumatic.  Eyes: Pupils are equal, round, and reactive to light. Conjunctivae are normal.  Neck: Normal range of motion.  Cardiovascular: Regular rhythm and normal heart sounds.  Respiratory: Effort normal. No respiratory distress.  GI: Soft.  Musculoskeletal: Normal range of motion.  Neurological: He is alert.  Skin: Skin is warm and dry.  Psychiatric: His affect is labile. His speech is tangential. He is agitated. He is not aggressive. Thought content is paranoid. Cognition and memory are impaired. He expresses impulsivity. He expresses no homicidal and no suicidal ideation.    Review of Systems   Constitutional: Negative.   HENT: Negative.   Eyes: Negative.   Respiratory: Negative.   Cardiovascular: Negative.   Gastrointestinal: Negative.   Musculoskeletal: Negative.   Skin: Negative.   Neurological: Negative.   Psychiatric/Behavioral: Negative.  Negative for depression, hallucinations, memory loss, substance abuse and suicidal ideas. The patient is not nervous/anxious and does not have insomnia.     Blood pressure (!) 137/108, pulse 82, temperature 98.1 F (36.7 C), temperature source Oral, resp. rate 20, height 6' (1.829 m), weight 127 kg, SpO2 95 %.Body  mass index is 37.97 kg/m.  General Appearance: Casual  Eye Contact:  Good  Speech:  Clear and Coherent  Volume:  Normal  Mood:  Euthymic  Affect:  Inappropriate  Thought Process:  Disorganized  Orientation:  Full (Time, Place, and Person)  Thought Content:  Paranoid Ideation, Rumination and Tangential  Suicidal Thoughts:  No  Homicidal Thoughts:  No  Memory:  Immediate;   Fair Recent;   Fair Remote;   Fair  Judgement:  Impaired  Insight:  Shallow  Psychomotor Activity:  Restlessness  Concentration:  Concentration: Fair  Recall:  [employer]  Fund of Knowledge:  Fair  Language:  Fair  Akathisia:  No  Handed:  Right  AIMS (if indicated):     Assets:  Desire for Improvement Housing Resilience  ADL's:  Intact  Cognition:  Impaired,  Mild  Sleep:  Number of Hours: 2.25     Treatment Plan Summary: Daily contact with patient to assess and evaluate symptoms and progress in treatment, Medication management and Plan Patient is agitated but has not actually been violent or threatening violence.  Does not show any signs of suicidality.  He is actively resistant to any further treatment or education about treatment.  He is requesting discharge.  Treatment team will discuss this but I am anticipating we will likely discharge him tomorrow.  If we can find [facility].  We can also review  any other shelters or options available.  No change to medicine today.  Attempted to engage him in some kind of psychoeducation or even basic conversation with little success.  Mordecai RasmussenJohn , MD 02/24/2019, 12:52 PM

## 2019-02-24 NOTE — Tx Team (Signed)
Interdisciplinary Treatment and Diagnostic Plan Update  02/24/2019 Time of Session: 830am Eddie Holland MRN: [id number]  Principal Diagnosis: Schizophrenia [facility])  Secondary Diagnoses: Principal Problem:   Schizophrenia (HCC)   Current Medications:  Current Facility-Administered Medications  Medication Dose Route Frequency Provider Last Rate Last Dose  . acetaminophen (TYLENOL) tablet 650 mg  650 mg Oral Q6H PRN Clapacs, John T, MD      . alum & mag hydroxide-simeth (MAALOX/MYLANTA) 200-200-20 MG/5ML suspension 30 mL  30 mL Oral Q4H PRN Clapacs, Jackquline Denmark, MD   30 mL at 02/20/19 2048  . fluticasone (FLONASE) 50 MCG/ACT nasal spray 2 spray  2 spray Each Nare Daily Clapacs, John T, MD   2 spray at 02/20/19 0900  . haloperidol (HALDOL) tablet 10 mg  10 mg Oral BID Clapacs, John T, MD   10 mg at 02/20/19 0900  . haloperidol decanoate (HALDOL DECANOATE) 100 MG/ML injection 200 mg  200 mg Intramuscular Q30 days Money, [place] B, FNP   200 mg at 02/17/19 1644  . hydrOXYzine (ATARAX/VISTARIL) tablet 50 mg  50 mg Oral TID PRN Money, Gerlene Burdock, FNP   50 mg at 02/19/19 2007  . magnesium hydroxide (MILK OF MAGNESIA) suspension 30 mL  30 mL Oral Daily PRN Clapacs, John T, MD      . naproxen (NAPROSYN) tablet 500 mg  500 mg Oral BID WC Money, Gerlene Burdock, FNP   500 mg at 02/24/19 1224  . OLANZapine zydis (ZYPREXA) disintegrating tablet 10 mg  10 mg Oral Q8H PRN Antonieta Pert, MD   10 mg at 02/19/19 2007   And  . ziprasidone (GEODON) injection 20 mg  20 mg Intramuscular PRN Antonieta Pert, MD      . traZODone (DESYREL) tablet 100 mg  100 mg Oral QHS PRN Antonieta Pert, MD   100 mg at 02/19/19 2012   PTA Medications: No medications prior to admission.    Patient Stressors: Financial difficulties Health problems Other: Living arrangement  Patient Strengths: Ability for insight Communication skills Motivation for treatment/growth  Treatment Modalities: Medication Management, Group therapy,  Case management,  1 to 1 session with clinician, Psychoeducation, Recreational therapy.   Physician Treatment Plan for Primary Diagnosis: Schizophrenia (HCC) Long Term Goal(s): Improvement in symptoms so as ready for discharge Improvement in symptoms so as ready for discharge   Short Term Goals: Ability to verbalize feelings will improve Ability to demonstrate self-control will improve Ability to identify and develop effective coping behaviors will improve Compliance with prescribed medications will improve  Medication Management: Evaluate patient's response, side effects, and tolerance of medication regimen.  Therapeutic Interventions: 1 to 1 sessions, Unit Group sessions and Medication administration.  Evaluation of Outcomes: Progressing  Physician Treatment Plan for Secondary Diagnosis: Principal Problem:   Schizophrenia (HCC)  Long Term Goal(s): Improvement in symptoms so as ready for discharge Improvement in symptoms so as ready for discharge   Short Term Goals: Ability to verbalize feelings will improve Ability to demonstrate self-control will improve Ability to identify and develop effective coping behaviors will improve Compliance with prescribed medications will improve     Medication Management: Evaluate patient's response, side effects, and tolerance of medication regimen.  Therapeutic Interventions: 1 to 1 sessions, Unit Group sessions and Medication administration.  Evaluation of Outcomes: Progressing   RN Treatment Plan for Primary Diagnosis: Schizophrenia (HCC) Long Term Goal(s): Knowledge of disease and therapeutic regimen to maintain health will improve  Short Term Goals: Ability to demonstrate self-control, Ability to  participate in decision making will improve, Ability to identify and develop effective coping behaviors will improve and Compliance with prescribed medications will improve  Medication Management: RN will administer medications as ordered by  provider, will assess and evaluate patient's response and provide education to patient for prescribed medication. RN will report any adverse and/or side effects to prescribing provider.  Therapeutic Interventions: 1 on 1 counseling sessions, Psychoeducation, Medication administration, Evaluate responses to treatment, Monitor vital signs and CBGs as ordered, Perform/monitor CIWA, COWS, AIMS and Fall Risk screenings as ordered, Perform wound care treatments as ordered.  Evaluation of Outcomes: Progressing   LCSW Treatment Plan for Primary Diagnosis: Schizophrenia ([place]) Long Term Goal(s): Safe transition to appropriate next level of care at discharge, Engage patient in therapeutic group addressing interpersonal concerns.  Short Term Goals: Engage patient in aftercare planning with referrals and resources and Increase skills for wellness and recovery  Therapeutic Interventions: Assess for all discharge needs, 1 to 1 time with Social worker, Explore available resources and support systems, Assess for adequacy in community support network, Educate family and significant other(s) on suicide prevention, Complete Psychosocial Assessment, Interpersonal group therapy.  Evaluation of Outcomes: Progressing   Progress in Treatment: Attending groups: Yes. Participating in groups: Yes. Taking medication as prescribed: Yes. Toleration medication: Yes. Family/Significant other contact made: Yes, individual(s) contacted:  pts cousin and payee Patient understands diagnosis: No. Discussing patient identified problems/goals with staff: Yes. Medical problems stabilized or resolved: Yes. Denies suicidal/homicidal ideation: Yes. Issues/concerns per patient self-inventory: No. Other: NA  New problem(s) identified: No, Describe:  None reported  New Short Term/Long Term Goal(s):  Attend outpatient treatment, take medication as prescribed, develop and implement healthy coping methods Update 02/24/2019:   elimination of symptoms of psychosis, medication management for mood stabilization; elimination of SI thoughts; development of comprehensive mental wellness.  Patient Goals:  "[place] back to my state"  Discharge Plan or Barriers: Pt will return to his group home and follow up with outpatient treatment. Update 02/18/19: SPE pamphlet, Mobile Crisis information, and AA/NA information provided to patient for additional community support and resources. Pt has an appointment scheduled with [facility] on 10/26 at 2:30. Pt also has been tentatively accepted at [facility].  Update 02/24/2019:  Patient has begun to decline his oral medications since he has been given a injection, due to this the patient has begun to decompensate.  Patient was not admitted to [facility] since there was no guarantee of payment without patient having medicaid. It is possible that with payment patient could go here. Patient states daily different locations that he would like to be discharged to.  Reason for Continuation of Hospitalization: Medication stabilization  Estimated Length of Stay: 02/19/19  Attendees: Patient:  02/24/2019 11:33 AM  Physician: Alethia Berthold, MD 02/24/2019 11:33 AM  Nursing:  02/24/2019 11:33 AM  RN Care Manager: 02/24/2019 11:33 AM  Social Worker:  Assunta Curtis, LCSW 02/24/2019 11:33 AM  Recreational Therapist:  02/24/2019 11:33 AM  Other:  02/24/2019 11:33 AM  Other:  02/24/2019 11:33 AM  Other: 02/24/2019 11:33 AM    Scribe for Treatment Team: Rozann Lesches, LCSW 02/24/2019 11:33 AM

## 2019-02-24 NOTE — Progress Notes (Signed)
This Probation officer was informed by Darnelle Maffucci, [place] that patient came up to him and demanded that he be discharged today, he's not going to do this, stay here another night. Patient also stated that he lives in [place], [place] and needs a bus ticket today.

## 2019-02-24 NOTE — Progress Notes (Signed)
Recreation Therapy Notes  Date: 02/24/2019  Time: 9:30 am   Location: Craft room   Behavioral response: N/A   Intervention Topic: Happiness  Discussion/Intervention: Patient did not attend group.   Clinical Observations/Feedback:  Patient did not attend group.   Jaymi Tinner LRT/CTRS        Judit Awad 02/24/2019 11:03 AM 

## 2019-02-24 NOTE — Progress Notes (Signed)
This writer was walking down the hallway and stated to patient "how are you" and patient stated "are you ok, you're the one who needs medicine, it don't phase me".
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
## 2019-02-24 NOTE — Progress Notes (Signed)
Patient's BP was slightly high and refused to let this Probation officer re-check it. MD was notified of these findings.

## 2019-02-24 NOTE — Plan of Care (Signed)
D- Patient alert and oriented. Patient presented in an agitated/irritable mood on assessment, however, he was not speaking to this Probation officer only mumbling answers, instead of actually speaking to this Probation officer. Patient has been very labile today. Patient would be ok for a while and then he went to sit in the chair outside the medication room and started talking to himself, loudly and responding to internal stimuli. Patient denied SI, HI, AVH, and pain to this writer this morning, by mumbling "no". Patient had no stated goals for today.  A- Support and encouragement provided. Routine safety checks conducted every 15 minutes.  Patient informed to notify staff with problems or concerns.  R- No adverse drug reactions noted. Patient contracts for safety at this time. Patient remains safe at this time.   Problem: Education: Goal: Knowledge of  General Education information/materials will improve Outcome: Not Progressing Goal: Emotional status will improve Outcome: Not Progressing Goal: Mental status will improve Outcome: Not Progressing Goal: Verbalization of understanding the information provided will improve Outcome: Not Progressing   Problem: Activity: Goal: Interest or engagement in activities will improve Outcome: Not Progressing Goal: Sleeping patterns will improve Outcome: Not Progressing   Problem: Coping: Goal: Ability to verbalize frustrations and anger appropriately will improve Outcome: Not Progressing Goal: Ability to demonstrate self-control will improve Outcome: Not Progressing   Problem: Health Behavior/Discharge Planning: Goal: Identification of resources available to assist in meeting health care needs will improve Outcome: Not Progressing Goal: Compliance with treatment plan for underlying cause of condition will improve Outcome: Not Progressing   Problem: Physical Regulation: Goal: Ability to maintain clinical measurements within normal limits will  improve Outcome: Not Progressing   Problem: Safety: Goal: Periods of time without injury will increase Outcome: Not Progressing
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
## 2019-02-24 NOTE — Plan of Care (Signed)
Patient intrusive and needy with staff. Patient is redirectable but continues to ask the same questions to different staff members.   Problem: Education: Goal: Emotional status will improve Outcome: Not Progressing Goal: Mental status will improve Outcome: Not Progressing

## 2019-02-24 NOTE — Progress Notes (Signed)
Patient continues to refuse his medications. MD has been made aware.

## 2019-02-24 NOTE — BHH Group Notes (Signed)

## 2019-02-25 DIAGNOSIS — F203 Undifferentiated schizophrenia: Secondary | ICD-10-CM | POA: Diagnosis not present

## 2019-02-25 MED ORDER — OLANZAPINE 10 MG IM SOLR
15.0000 mg | Freq: Two times a day (BID) | INTRAMUSCULAR | Status: DC | PRN
  Administered 2019-02-25 – 2019-03-04 (×5): 15 mg via INTRAMUSCULAR
  Filled 2019-02-25 (×5): qty 20

## 2019-02-25 MED ORDER — DIVALPROEX SODIUM 500 MG PO DR TAB
500.0000 mg | DELAYED_RELEASE_TABLET | Freq: Two times a day (BID) | ORAL | Status: DC
  Administered 2019-02-27 – 2019-03-06 (×13): 500 mg via ORAL
  Filled 2019-02-25 (×12): qty 1

## 2019-02-25 MED ORDER — OLANZAPINE 5 MG PO TBDP
10.0000 mg | ORAL_TABLET | Freq: Two times a day (BID) | ORAL | Status: DC
  Administered 2019-02-27 – 2019-03-02 (×6): 10 mg via ORAL
  Filled 2019-02-25 (×6): qty 2

## 2019-02-25 NOTE — Plan of Care (Signed)
Patient  not cooperating  with  unit programing  Refused am medication . Patient  pulled mattress into bathroom . Openly sitting on commode  with door open    Problem: Education: Goal: Knowledge of [place] [facility] Education information/materials will improve Outcome: Not Progressing Goal: Emotional status will improve Outcome: Not Progressing Goal: Mental status will improve Outcome: Not Progressing Goal: Verbalization of understanding the information provided will improve Outcome: Not Progressing   Problem: Activity: Goal: Interest or engagement in activities will improve Outcome: Not Progressing Goal: Sleeping patterns will improve Outcome: Not Progressing   Problem: Coping: Goal: Ability to verbalize frustrations and anger appropriately will improve Outcome: Not Progressing Goal: Ability to demonstrate self-control will improve Outcome: Not Progressing   Problem: Health Behavior/Discharge Planning: Goal: Identification of resources available to assist in meeting health care needs will improve Outcome: Not Progressing Goal: Compliance with treatment plan for underlying cause of condition will improve Outcome: Not Progressing   Problem: Physical Regulation: Goal: Ability to maintain clinical measurements within normal limits will improve Outcome: Not Progressing   Problem: Safety: Goal: Periods of time without injury will increase Outcome: Not Progressing

## 2019-02-25 NOTE — Progress Notes (Addendum)
D:  Acting Out Behavior   A:Patient stated slept good last night .Stated appetite  good and energy level  normal. Stated concentration  good . Stated on Depression scale 0 , hopeless 0 and anxiety 0 .( low 0-10 high) Denies suicidal  homicidal ideations  .  No auditory hallucinations  No pain concerns . Appropriate ADL'S. Interacting with peers and staff. Patient  not cooperating  with  unit programing  Refused am medication . Informed MD about am medication  Patient  Refused noon medication  Received  Forced medication  Patient  pulled mattress into bathroom . Openly sitting on commode  with door open . Encourage patient participation with unit programming . Instruction  Given on  Medication ,  Patient sleeping in dayroom  Refusing to go to his room  , where the mattress is in the bathroom  R: Voice no other concerns. Staff continue to monitor

## 2019-02-25 NOTE — Progress Notes (Signed)
Recreation Therapy Notes  Date: 02/25/2019  Time: 9:30 am   Location: Craft room   Behavioral response: N/A   Intervention Topic: Goals  Discussion/Intervention: Patient did not attend group.   Clinical Observations/Feedback:  Patient did not attend group.   Ryian Lynde LRT/CTRS        Vishal Sandlin 02/25/2019 11:02 AM

## 2019-02-25 NOTE — [facility] Group Notes (Signed)
  LCSW Group Therapy Note  02/25/2019 10:51 AM   Type of Therapy/Topic:  Group Therapy:  Feelings about Diagnosis  Participation Level:  None   Description of Group:   This group will allow patients to explore their thoughts and feelings about diagnoses they have received. Patients will be guided to explore their level of understanding and acceptance of these diagnoses. Facilitator will encourage patients to process their thoughts and feelings about the reactions of others to their diagnosis and will guide patients in identifying ways to discuss their diagnosis with significant others in their lives. This group will be process-oriented, with patients participating in exploration of their own experiences, giving and receiving support, and processing challenge from other group members.   Therapeutic Goals: 1. Patient will demonstrate understanding of diagnosis as evidenced by identifying two or more symptoms of the disorder 2. Patient will be able to express two feelings regarding the diagnosis 3. Patient will demonstrate their ability to communicate their needs through discussion and/or role play  Summary of Patient Progress: Pt sat in group for about 5 minutes and then left.   Therapeutic Modalities:   Cognitive Behavioral Therapy Brief Therapy Feelings Identification    Evalina Field, MSW, LCSW Clinical Social Work 02/25/2019 10:51 AM

## 2019-02-25 NOTE — Progress Notes (Signed)
[facility] MD Progress Note  02/25/2019 9:54 AM Eddie Holland  MRN:  [id number] Subjective: Follow-up for this gentleman with schizophrenia.  Met with patient this morning.  Spoke with the full treatment team about him as well.  The patient is consistently disorganized and agitated at this point.  I sat down with him in my office and tried to have a very calm one-on-one conversation with him but all he did is rant with pressured speech about things that had nothing to do with the topic most of which sounded delusional.  I let him do this a while and then tried to change the subject to something more rational just to see if he was capable of it but he went off on another irrational tangent.  I advised him that if we were going to discharge him, which he claims is his desire, that I would need to be able to have a more reasoned conversation about his discharge plan first.  This did not make any difference in his ability to answer questions rationally.  Based on this I must conclude that although the patient at times seems to be behaving in a premeditated manner to make trouble and get attention, that he remains out of touch with reality and unable to control himself to meet his needs.  He has been consistently refusing to take any oral medicine and refuses to even discuss it.  When I tried to simply have a discussion with him this morning he started making a moaning noise and shaking his head back and forth.  I pointed out to him that this was what a 58-year-old would typically do in this situation and that I would prefer to talk with a adult man.  This made no impression either. Principal Problem: Schizophrenia ([place]) Diagnosis: Principal Problem:   Schizophrenia ([place])  Total Time spent with patient: 30 minutes  Past Psychiatric History: Patient has a history of psychiatric problems going back decades.  We have been able to find out that since he moved to [place] sometime ago his condition has deteriorated.   He seems to be having hospitalizations one after the other.  Past Medical History:  Past Medical History:  Diagnosis Date  . Asthma   . Back pain   . Paranoia [facility])     Past Surgical History:  Procedure Laterality Date  . CHOLECYSTECTOMY     Family History: History reviewed. No pertinent family history. Family Psychiatric  History: See notes.  None known. Social History:  Social History   Substance and Sexual Activity  Alcohol Use No     Social History   Substance and Sexual Activity  Drug Use No    Social History   Socioeconomic History  . Marital status: Married    Spouse name: Not on file  . Number of children: Not on file  . Years of education: Not on file  . Highest education level: Not on file  Occupational History  . Not on file  Social Needs  . Financial resource strain: Patient refused  . Food insecurity    Worry: Patient refused    Inability: Patient refused  . Transportation needs    Medical: Patient refused    Non-medical: Patient refused  Tobacco Use  . Smoking status: Never Smoker  . Smokeless tobacco: Never Used  Substance and Sexual Activity  . Alcohol use: No  . Drug use: No  . Sexual activity: Not Currently  Lifestyle  . Physical activity    Days per  week: Patient refused    Minutes per session: Patient refused  . Stress: Patient refused  Relationships  . Social Herbalist on phone: Patient refused    Gets together: Patient refused    Attends religious service: Patient refused    Active member of club or organization: Patient refused    Attends meetings of clubs or organizations: Patient refused    Relationship status: Patient refused  Other Topics Concern  . Not on file  Social History Narrative  . Not on file   Additional Social History:                         Sleep: Poor  Appetite:  Good  Current Medications: Current Facility-Administered Medications  Medication Dose Route Frequency Provider Last  Rate Last Dose  . acetaminophen (TYLENOL) tablet 650 mg  650 mg Oral Q6H PRN Susan Bleich T, MD      . alum & mag hydroxide-simeth (MAALOX/MYLANTA) 200-200-20 MG/5ML suspension 30 mL  30 mL Oral Q4H PRN Dietra Stokely, Madie Reno, MD   30 mL at 02/20/19 2048  . fluticasone (FLONASE) 50 MCG/ACT nasal spray 2 spray  2 spray Each Nare Daily Carman Essick T, MD   2 spray at 02/20/19 0900  . haloperidol (HALDOL) tablet 10 mg  10 mg Oral BID Breven Guidroz T, MD   10 mg at 02/20/19 0900  . haloperidol decanoate (HALDOL DECANOATE) 100 MG/ML injection 200 mg  200 mg Intramuscular Q30 days Money, [place] B, FNP   200 mg at 02/17/19 1644  . hydrOXYzine (ATARAX/VISTARIL) tablet 50 mg  50 mg Oral TID PRN Money, Lowry Ram, FNP   50 mg at 02/19/19 2007  . magnesium hydroxide (MILK OF MAGNESIA) suspension 30 mL  30 mL Oral Daily PRN Benjaman Artman T, MD      . naproxen (NAPROSYN) tablet 500 mg  500 mg Oral BID WC Money, Lowry Ram, FNP   500 mg at 02/24/19 1308  . OLANZapine zydis (ZYPREXA) disintegrating tablet 10 mg  10 mg Oral Q8H PRN Sharma Covert, MD   10 mg at 02/19/19 2007   And  . ziprasidone (GEODON) injection 20 mg  20 mg Intramuscular PRN Sharma Covert, MD      . traZODone (DESYREL) tablet 100 mg  100 mg Oral QHS PRN Sharma Covert, MD   100 mg at 02/19/19 2012    Lab Results: No results found for this or any previous visit (from the past 48 hour(s)).  Blood Alcohol level:  Lab Results  Component Value Date   ETH <10 02/12/2019   ETH <10 65/78/4696    Metabolic Disorder Labs: No results found for: HGBA1C, MPG No results found for: PROLACTIN Lab Results  Component Value Date   CHOL 137 02/14/2019   TRIG 42 02/14/2019   HDL 56 02/14/2019   CHOLHDL 2.4 02/14/2019   VLDL 8 02/14/2019   LDLCALC 73 02/14/2019    Physical Findings: AIMS:  , ,  ,  ,    CIWA:    COWS:     Musculoskeletal: Strength & Muscle Tone: within normal limits Gait & Station: normal Patient leans:  N/A  Psychiatric Specialty Exam: Physical Exam  Nursing note and vitals reviewed. Constitutional: He appears well-developed and well-nourished.  HENT:  Head: Normocephalic and atraumatic.  Eyes: Pupils are equal, round, and reactive to light. Conjunctivae are normal.  Neck: Normal range of motion.  Cardiovascular: Regular rhythm and  normal heart sounds.  Respiratory: Effort normal. No respiratory distress.  GI: Soft.  Musculoskeletal: Normal range of motion.  Neurological: He is alert.  Skin: Skin is warm and dry.  Psychiatric: His affect is angry, labile and inappropriate. His speech is rapid and/or pressured and tangential. He is agitated and aggressive. Thought content is paranoid and delusional. Cognition and memory are impaired. He expresses impulsivity and inappropriate judgment. He is noncommunicative.    Review of Systems  Constitutional: Negative.   HENT: Negative.   Eyes: Negative.   Respiratory: Negative.   Cardiovascular: Negative.   Gastrointestinal: Negative.   Musculoskeletal: Negative.   Skin: Negative.   Neurological: Negative.   Psychiatric/Behavioral: Negative for depression, hallucinations, memory loss, substance abuse and suicidal ideas. The patient is nervous/anxious and has insomnia.     Blood pressure (!) 142/92, pulse 67, temperature 98.1 F (36.7 C), temperature source Oral, resp. rate 17, height 6' (1.829 m), weight 127 kg, SpO2 94 %.Body mass index is 37.97 kg/m.  General Appearance: Bizarre  Eye Contact:  Fair  Speech:  Garbled and Pressured  Volume:  Increased  Mood:  Irritable  Affect:  Inappropriate  Thought Process:  Disorganized  Orientation:  Negative  Thought Content:  Illogical, Delusions, Ideas of Reference:   Paranoia, Paranoid Ideation, Rumination and Tangential  Suicidal Thoughts:  No  Homicidal Thoughts:  No  Memory:  Immediate;   Fair Recent;   Poor Remote;   Poor  Judgement:  Impaired  Insight:  Lacking  Psychomotor  Activity:  Restlessness  Concentration:  Concentration: Poor  Recall:  Poor  Fund of Knowledge:  Fair  Language:  Fair  Akathisia:  No  Handed:  Right  AIMS (if indicated):     Assets:  Physical Health  ADL's:  Impaired  Cognition:  Impaired,  Mild  Sleep:  Number of Hours: 7.75     Treatment Plan Summary: Daily contact with patient to assess and evaluate symptoms and progress in treatment, Medication management and Plan Treatment team discussed this gentleman today.  He is clearly psychotic and clearly has a chronic mental illness.  On the other hand he is not suicidal and is not threatening violence against anyone.  1 option would be to discharge him.  My problem with that is that we have no specific place that we know of that he is planning to go to.  He talks about wanting to go to [place], to [facility], and to [employer] all without there being any kind of plan for a specific place to go or stay.  I am very concerned that if we released him from the hospital he is not capable of caring for himself and would wind up in trouble, incarcerated or back in the hospital very quickly.  The other option is to treat this illness better.  Patient is refusing to engage in even basic conversation about it, so the only option is to start forced medication.  I have spoken to Dr. Jake Michaelis and ask her to consider writing a second opinion for forced medicine if she is agreeable.  My plan would be to order the patient to take Depakote and Zyprexa orally, and if he refuses to replace it with a forced injection of Zyprexa.  I informed the patient of this but again he was so disorganized and paranoid I am not sure he understood.  I understand the patient is disruptive to the unit but I hope that with more medication we can get it  under control more quickly.  We should get an EKG on him.  None has been done but he has so far been uncooperative.  Alethia Berthold, MD 02/25/2019, 9:54 AM

## 2019-02-25 NOTE — Progress Notes (Addendum)
Second Opinion for Forced Medications  Second opinion requested by Dr Weber Cooks for forced IM medication. Chart reviewed. Eddie Holland is a 58 year old male with schizoaffective disorder who was admitted to the inpatient psychiatry unit with active psychosis.  He continues to have disorganized thought processes as well as grandiose delusions and is refusing all oral medication.  He is unlikely to benefit from treatment without psychotropic medications.  I would support him receiving IM medications as recommended by Dr. Weber Cooks. Will need to check EKG to R/O QTc prolongation.

## 2019-02-26 DIAGNOSIS — F203 Undifferentiated schizophrenia: Secondary | ICD-10-CM | POA: Diagnosis not present

## 2019-02-26 NOTE — Plan of Care (Signed)
Patient was not intrusive with staff this evening and was observed interacting appropriately with peers on the unit.   Problem: Education: Goal: Mental status will improve Outcome: Progressing

## 2019-02-26 NOTE — [facility] Group Notes (Signed)
LCSW Group Therapy Note  02/26/2019 1:00 PM  Type of Therapy/Topic:  Group Therapy:  Emotion Regulation  Participation Level:  Minimal   Description of Group:   The purpose of this group is to assist patients in learning to regulate negative emotions and experience positive emotions. Patients will be guided to discuss ways in which they have been vulnerable to their negative emotions. These vulnerabilities will be juxtaposed with experiences of positive emotions or situations, and patients will be challenged to use positive emotions to combat negative ones. Special emphasis will be placed on coping with negative emotions in conflict situations, and patients will process healthy conflict resolution skills.  Therapeutic Goals: 1. Patient will identify two positive emotions or experiences to reflect on in order to balance out negative emotions 2. Patient will label two or more emotions that they find the most difficult to experience 3. Patient will demonstrate positive conflict resolution skills through discussion and/or role plays  Summary of Patient Progress: Patient was present in group and shared that he often feels like he is "in trouble". Patient did not elaborate.  Patient slept the remainder of group.  Patient refused to leave group room once group was over and CSW had to ask security for support.   Therapeutic Modalities:   Cognitive Behavioral Therapy Feelings Identification Dialectical Behavioral Therapy  Assunta Curtis, MSW, LCSW 02/26/2019 10:32 AM

## 2019-02-26 NOTE — Progress Notes (Signed)
Recreation Therapy Notes    Date: 02/26/2019  Time: 9:30 am   Location: Craft room   Behavioral response: N/A   Intervention Topic: Coping skills  Discussion/Intervention: Patient did not attend group.   Clinical Observations/Feedback:  Patient did not attend group.   Lynnett Langlinais LRT/CTRS        Eddie Holland 02/26/2019 10:48 AM 

## 2019-02-26 NOTE — Progress Notes (Signed)
After the treats to me this morning pt seemed to calm down. He did attend groups. Collier Bullock RN

## 2019-02-26 NOTE — Progress Notes (Signed)
Patient is alert and oriented x 3 with periods of confusion to situation, his thoughts are dorganized at times to situation, he appears less irritable, interacting with peers and staff appropriately in the milieu,  no disruptive behavior on unit , patient however was noted to have his bed mattress in the bathroom, beside the commode, writer asked patient to remove mattress in the bathroom bur he refused. Patient refused evening medication he stated " No l don't want it" 15 minutes safety checks maintained will continue to monitor

## 2019-02-26 NOTE — Progress Notes (Signed)
D - Patient was in the day room upon arrival to the unit. Patient was pleasant during assessment denying SI/HI,AVHpain, anxiety and depression with this Probation officer. Patient wasn't as intrusive this evening with staff. Patient did refuse his night medications stating, "I didn't take anything today and I won't tonight either." Patient given education, patient still refused.   A - Patient given support and encouragement to be active in his treatment plan. Patient informed to let staff know if there are any issues or problems on the unit.  R - Patient being monitored Q 15 minutes for safety per unit protocol. Patient remains safe on the unit.

## 2019-02-26 NOTE — Progress Notes (Signed)
[facility] MD Progress Note  02/26/2019 2:19 PM Eddie Holland  MRN:  [id number]   Subjective:  Patient seen in person for follow-up for this patient with schizophrenia.  Patient started out answering questions with head nods and non verbal sounds. Patient progressed to answering questions verbally which is an improvement from when he would only shake his head yes or no but remains fixated on going to the [employer].  Patient states "I want to go back to the [facility] and leave this place, the hospital there already has a bed there for me".  When alternative medications were recommended and discussed with patient after mentioning the injection patient had to receive to allow his behavior to remain in control patient refuses all including the haldol orally that he had originally requested at the beginning of his hospitalization.  Patient has denied any suicidal or homicidal ideations and denies any hallucinations.   Patient denies having any complaints or concerns at this time, besides the desire to discharge.  Patient also denies having any medication side effects.  Patient agrees that he has been sleeping well and having a good appetite.  Patient denies any suicidal or homicidal ideations at this time as well as any auditory or visual hallucinations.   Principal Problem: Schizophrenia (HCC) Diagnosis: Principal Problem:   Schizophrenia (HCC)  Total Time spent with patient: 20 minutes  Past Psychiatric History: Per chart Review:  Patient is a notably poor historian but scattered between chart review and patient verbal history it is ascertained that he has been diagnosed with schizophrenia or schizoaffective disorder for a long time.  Patient reports multiple inpatient hospitalizations.  He had previously lived in [place] but at some point move to [place] apparently after his wife passed away.  He tells me that his mother is still living and that she lives in [place] and is his legal  guardian.  He has a lot of paranoid and disorganized animosity towards her.  He denies ever having tried to kill himself.  He rattles off multiple names of antipsychotics and mood stabilizers all of which she says have done him wrong and that he does not like.  The only medicine he likes is either Haldol or Ativan.  Past Medical History:  Past Medical History:  Diagnosis Date  . Asthma   . Back pain   . Paranoia [facility])     Past Surgical History:  Procedure Laterality Date  . CHOLECYSTECTOMY     Family History: History reviewed. No pertinent family history. Family Psychiatric  History: Denies any known Social History:  Social History   Substance and Sexual Activity  Alcohol Use No     Social History   Substance and Sexual Activity  Drug Use No    Social History   Socioeconomic History  . Marital status: Married    Spouse name: Not on file  . Number of children: Not on file  . Years of education: Not on file  . Highest education level: Not on file  Occupational History  . Not on file  Social Needs  . Financial resource strain: Patient refused  . Food insecurity    Worry: Patient refused    Inability: Patient refused  . Transportation needs    Medical: Patient refused    Non-medical: Patient refused  Tobacco Use  . Smoking status: Never Smoker  . Smokeless tobacco: Never Used  Substance and Sexual Activity  . Alcohol use: No  . Drug use: No  .  Sexual activity: Not Currently  Lifestyle  . Physical activity    Days per week: Patient refused    Minutes per session: Patient refused  . Stress: Patient refused  Relationships  . Social Musicianconnections    Talks on phone: Patient refused    Gets together: Patient refused    Attends religious service: Patient refused    Active member of club or organization: Patient refused    Attends meetings of clubs or organizations: Patient refused    Relationship status: Patient refused  Other Topics Concern  . Not on file  Social  History Narrative  . Not on file   Additional Social History:                         Sleep: Good  Appetite:  Good  Current Medications: Current Facility-Administered Medications  Medication Dose Route Frequency Provider Last Rate Last Dose  . acetaminophen (TYLENOL) tablet 650 mg  650 mg Oral Q6H PRN Clapacs, John T, MD      . alum & mag hydroxide-simeth (MAALOX/MYLANTA) 200-200-20 MG/5ML suspension 30 mL  30 mL Oral Q4H PRN Clapacs, Jackquline DenmarkJohn T, MD   30 mL at 02/20/19 2048  . divalproex (DEPAKOTE) DR tablet 500 mg  500 mg Oral Q12H Clapacs, John T, MD      . fluticasone (FLONASE) 50 MCG/ACT nasal spray 2 spray  2 spray Each Nare Daily Clapacs, John T, MD   2 spray at 02/20/19 0900  . haloperidol decanoate (HALDOL DECANOATE) 100 MG/ML injection 200 mg  200 mg Intramuscular Q30 days Daichi Moris, [place]rav[place] B, FNP   200 mg at 02/17/19 1644  . hydrOXYzine (ATARAX/VISTARIL) tablet 50 mg  50 mg Oral TID PRN Audrie Kuri, Gerlene Burdockravis B, FNP   50 mg at 02/19/19 2007  . magnesium hydroxide (MILK OF MAGNESIA) suspension 30 mL  30 mL Oral Daily PRN Clapacs, John T, MD      . naproxen (NAPROSYN) tablet 500 mg  500 mg Oral BID WC Gary Bultman, Gerlene Burdockravis B, FNP   500 mg at 02/24/19 [id number]  . OLANZapine (ZYPREXA) injection 15 mg  15 mg Intramuscular Q12H PRN Clapacs, Jackquline DenmarkJohn T, MD   15 mg at 02/26/19 0817  . OLANZapine zydis (ZYPREXA) disintegrating tablet 10 mg  10 mg Oral Q12H Clapacs, John T, MD      . traZODone (DESYREL) tablet 100 mg  100 mg Oral QHS PRN Antonieta Pertlary, Greg Lawson, MD   100 mg at 02/19/19 2012    Lab Results: No results found for this or any previous visit (from the past 48 hour(s)).  Blood Alcohol level:  Lab Results  Component Value Date   ETH <10 02/12/2019   ETH <10 02/10/2019    Metabolic Disorder Labs: No results found for: HGBA1C, MPG No results found for: PROLACTIN Lab Results  Component Value Date   CHOL 137 02/14/2019   TRIG 42 02/14/2019   HDL 56 02/14/2019   CHOLHDL 2.4 02/14/2019   VLDL 8  02/14/2019   LDLCALC 73 02/14/2019    Physical Findings: AIMS:  , ,  ,  ,    CIWA:    COWS:     Musculoskeletal: Strength & Muscle Tone: within normal limits Gait & Station: normal Patient leans: N/A  Psychiatric Specialty Exam: Physical Exam  Nursing note and vitals reviewed. Constitutional: He is oriented to person, place, and time. He appears well-developed and well-nourished.  Cardiovascular: Normal rate.  Respiratory: Effort normal.  Musculoskeletal: Normal range of  motion.  Neurological: He is alert and oriented to person, place, and time.  Skin: Skin is warm.    Review of Systems  Constitutional: Negative.   HENT: Negative.   Eyes: Negative.   Respiratory: Negative.   Cardiovascular: Negative.   Gastrointestinal: Negative.   Genitourinary: Negative.   Musculoskeletal: Negative.   Skin: Negative.   Neurological: Negative.   Endo/Heme/Allergies: Negative.   Psychiatric/Behavioral: Negative for depression, hallucinations and suicidal ideas. The patient is not nervous/anxious.     Blood pressure (!) 133/99, pulse 97, temperature 98.1 F (36.7 C), temperature source Oral, resp. rate 17, height 6' (1.829 m), weight 127 kg, SpO2 (!) 83 %.Body mass index is 37.97 kg/m.  General Appearance: Casual  Eye Contact:  Minimal  Speech:  Normal Rate  Volume:  Decreased  Mood:  Depressed  Affect:  Blunt  Thought Process:  Goal Directed  Orientation:  Full (Time, Place, and Person)  Thought Content:  Illogical  Suicidal Thoughts:  No  Homicidal Thoughts:  No  Memory:  Immediate;   Good  Judgement:  Poor  Insight:  Lacking  Psychomotor Activity:  Normal  Concentration:  Concentration: Good  Recall:  Good  Fund of Knowledge:  Good  Language:  Good  Akathisia:  No  Handed:  Right  AIMS (if indicated):     Assets:  Communication Skills Desire for Improvement Leisure Time  ADL's:  Impaired  Cognition:  WNL  Sleep:  Number of Hours: 7.75   Assessment: Patient  presents quietly in the day area with a sock on his head wearing it as a hat.  He presents goal directed thinking but poor incite into illness so patient remains focussed on irrational discharge plans.  Patient's judgment continues to be poor and he is noted to be refusing and stating that he should not take medications orally.  Patient continues with defiant and contradictory behaviors throughout the day, it was reported by staff during groups that he refused to leave the group or participate and was making inappropriate statements despite continued redirection.  Despite medication education and discharge planning patient continues to refuse oral medications. If this continues to assess the benefit vs risk of an additional long acting antipsychotic. Patients judgement and incite at this time remain compromised, although working towards a discharge at this time patient appears to need additonal resources before this can be accomplished.   Treatment Plan Summary: Daily contact with patient to assess and evaluate symptoms and progress in treatment and Medication management   Continue Depakote 500 mg po BID mood managment Continue Haldol Decanoate 200 mg IM Q30 days last dose on 02/17/2019 Continue Atarax 50 mg TID po prn anxiety Continue zyprexa 10 mg po BID (If po prn zyprexa and Depakote refused continue IM zyprexa 15 mg BID) Continue Naproxen 500 mg PO BID with meals for back and leg pain Continue Trazodone 100 mg PO QHS PRN for insomnia Encourage group therapy participation Continue Q15 minute safety checks      Lewis Shock, FNP 02/26/2019, 2:19 PM

## 2019-02-26 NOTE — Progress Notes (Addendum)
Pt was sitting in a chair next to the med room. He stated to me, "I'm going to beat your ass and kill your as for giving me that injection." I walked to the nurse's station and told Gwen and the security officer to come out there while I give my medication to another client. I also told Regie the [facility]. MD notified.Collier Bullock RN

## 2019-02-27 DIAGNOSIS — F203 Undifferentiated schizophrenia: Secondary | ICD-10-CM | POA: Diagnosis not present

## 2019-02-27 MED ORDER — PALIPERIDONE PALMITATE ER 234 MG/1.5ML IM SUSY
234.0000 mg | PREFILLED_SYRINGE | INTRAMUSCULAR | Status: DC
  Administered 2019-02-27: 234 mg via INTRAMUSCULAR
  Filled 2019-02-27: qty 1.5

## 2019-02-27 NOTE — Progress Notes (Signed)
[facility]  02/27/2019 11:51 AM Eddie Holland  MRN:  [id number]   Subjective: Follow-up on patient and has been diagnosed with schizophrenia.  Patient reports today that he is doing fine.  He states he does not need any other medications and that he will only take Haldol.  Attempted to educate patient on him requiring Zyprexa injections as needed and he states "that is fine."  Patient denies any suicidal or homicidal ideations and denies any hallucinations.  Patient states that he is ready to leave.  Principal Problem: Schizophrenia (HCC) Diagnosis: Principal Problem:   Schizophrenia (HCC)  Total Time spent with patient: 30 minutes  Past Psychiatric History: Patient is a notably poor historian but scattered between chart review and patient verbal history it is ascertained that he has been diagnosed with schizophrenia or schizoaffective disorder for a long time. Patient reports multiple inpatient hospitalizations. He had previously lived in [place] but at some point move to [place] apparently after his wife passed away. He tells me that his mother is still living and that she lives in [place] and is his legal guardian. He has a lot of paranoid and disorganized animosity towards her. He denies ever having tried to kill himself. He rattles off multiple names of antipsychotics and mood stabilizers all of which she says have done him wrong and that he does not like. The only medicine he likes is either Haldol or Ativan.  Past Medical History:  Past Medical History:  Diagnosis Date  . Asthma   . Back pain   . Paranoia [facility](HCC)     Past Surgical History:  Procedure Laterality Date  . CHOLECYSTECTOMY     Family History: History reviewed. No pertinent family history. Family Psychiatric  History: Denies Social History:  Social History   Substance and Sexual Activity  Alcohol Use No     Social History   Substance and Sexual Activity  Drug Use No    Social  History   Socioeconomic History  . Marital status: Married    Spouse name: Not on file  . Number of children: Not on file  . Years of education: Not on file  . Highest education level: Not on file  Occupational History  . Not on file  Social Needs  . Financial resource strain: Patient refused  . Food insecurity    Worry: Patient refused    Inability: Patient refused  . Transportation needs    Medical: Patient refused    Non-medical: Patient refused  Tobacco Use  . Smoking status: Never Smoker  . Smokeless tobacco: Never Used  Substance and Sexual Activity  . Alcohol use: No  . Drug use: No  . Sexual activity: Not Currently  Lifestyle  . Physical activity    Days per week: Patient refused    Minutes per session: Patient refused  . Stress: Patient refused  Relationships  . Social Musicianconnections    Talks on phone: Patient refused    Gets together: Patient refused    Attends religious service: Patient refused    Active member of club or organization: Patient refused    Attends meetings of clubs or organizations: Patient refused    Relationship status: Patient refused  Other Topics Concern  . Not on file  Social History Narrative  . Not on file   Additional Social History:                         Sleep:  Good  Appetite:  Good  Current Medications: Current Facility-Administered Medications  Medication Dose Route Frequency Provider Last Rate Last Dose  . acetaminophen (TYLENOL) tablet 650 mg  650 mg Oral Q6H PRN Clapacs, John T, MD      . alum & mag hydroxide-simeth (MAALOX/MYLANTA) 200-200-20 MG/5ML suspension 30 mL  30 mL Oral Q4H PRN Clapacs, Jackquline Denmark, MD   30 mL at 02/20/19 2048  . divalproex (DEPAKOTE) DR tablet 500 mg  500 mg Oral Q12H Clapacs, John T, MD      . fluticasone (FLONASE) 50 MCG/ACT nasal spray 2 spray  2 spray Each Nare Daily Clapacs, John T, MD   2 spray at 02/20/19 0900  . haloperidol decanoate (HALDOL DECANOATE) 100 MG/ML injection 200 mg   200 mg Intramuscular Q30 days Tsutomu Barfoot, [place] B, FNP   200 mg at 02/17/19 1644  . hydrOXYzine (ATARAX/VISTARIL) tablet 50 mg  50 mg Oral TID PRN Dallas Torok, Gerlene Burdock, FNP   50 mg at 02/19/19 2007  . magnesium hydroxide (MILK OF MAGNESIA) suspension 30 mL  30 mL Oral Daily PRN Clapacs, John T, MD      . naproxen (NAPROSYN) tablet 500 mg  500 mg Oral BID WC Mercer Stallworth, Gerlene Burdock, FNP   500 mg at 02/24/19 0947  . OLANZapine (ZYPREXA) injection 15 mg  15 mg Intramuscular Q12H PRN Clapacs, Jackquline Denmark, MD   15 mg at 02/27/19 0753  . OLANZapine zydis (ZYPREXA) disintegrating tablet 10 mg  10 mg Oral Q12H Clapacs, John T, MD      . paliperidone (INVEGA SUSTENNA) injection 234 mg  234 mg Intramuscular Q28 days Letty Salvi, Gerlene Burdock, FNP      . traZODone (DESYREL) tablet 100 mg  100 mg Oral QHS PRN Antonieta Pert, MD   100 mg at 02/19/19 2012    Lab Results: No results found for this or any previous visit (from the past 48 hour(s)).  Blood Alcohol level:  Lab Results  Component Value Date   ETH <10 02/12/2019   ETH <10 02/10/2019    Metabolic Disorder Labs: No results found for: HGBA1C, MPG No results found for: PROLACTIN Lab Results  Component Value Date   CHOL 137 02/14/2019   TRIG 42 02/14/2019   HDL 56 02/14/2019   CHOLHDL 2.4 02/14/2019   VLDL 8 02/14/2019   LDLCALC 73 02/14/2019    Physical Findings: AIMS:  , ,  ,  ,    CIWA:    COWS:     Musculoskeletal: Strength & Muscle Tone: within normal limits Gait & Station: normal Patient leans: N/A  Psychiatric Specialty Exam: Physical Exam  Nursing note and vitals reviewed. Constitutional: He is oriented to person, place, and time. He appears well-developed and well-nourished.  Cardiovascular: Normal rate.  Respiratory: Effort normal.  Musculoskeletal: Normal range of motion.  Neurological: He is alert and oriented to person, place, and time.  Skin: Skin is warm.    Review of Systems  Constitutional: Negative.   HENT: Negative.   Eyes:  Negative.   Respiratory: Negative.   Cardiovascular: Negative.   Gastrointestinal: Negative.   Genitourinary: Negative.   Musculoskeletal: Negative.   Skin: Negative.   Neurological: Negative.   Endo/Heme/Allergies: Negative.   Psychiatric/Behavioral:       Denies all but appears irritable and argumentative and has continued requirement of Zyprexa IM injections    Blood pressure (!) 133/99, pulse 97, temperature 98.1 F (36.7 C), temperature source Oral, resp. rate 17, height 6' (1.829 m),  weight 127 kg, SpO2 (!) 83 %.Body mass index is 37.97 kg/m.  General Appearance: Bizarre and Guarded  Eye Contact:  Minimal  Speech:  Clear and Coherent and pressured at times  Volume:  Increased  Mood:  Irritable  Affect:  Congruent  Thought Process:  Disorganized and Descriptions of Associations: Loose  Orientation:  Full (Time, Place, and Person)  Thought Content:  Illogical  Suicidal Thoughts:  No  Homicidal Thoughts:  No  Memory:  Immediate;   Fair Recent;   Fair Remote;   Fair  Judgement:  Impaired  Insight:  Lacking  Psychomotor Activity:  Normal  Concentration:  Concentration: Poor  Recall:  [employer] of Knowledge:  Fair  Language:  Fair  Akathisia:  No  Handed:  Right  AIMS (if indicated):     Assets:  Financial Resources/Insurance Resilience  ADL's:  Intact  Cognition:  WNL  Sleep:  Number of Hours: 5   Assessment: Patient presents in the bathroom land on his mattress that he has moved to the floor in his bathroom.  He is lying underneath the sink and beside the toilet.  Patient has continued to receive Zyprexa IM as needed injections due to medication refusals.  Attempted to discuss with the patient about starting him on [place] as well as Haldol and patient refused to discuss it with me.  He did make a comment that he feels that we are discharge to force medications on him and that there is absolutely nothing wrong with him and he does not need medications.  Patient  continues to have extremely poor insight.  I have ordered the Invega Sustenna 234 mg IM injection and then informed the nurses to continue encouragement of the patient receiving this injection.  This was discussed with Dr. Weber Cooks is the patient is very poor insight and needs an additional antipsychotic to stabilize him.  Patient would benefit from to long-acting injectables to help maintain his stability.  Treatment Plan Summary: Daily contact with patient to assess and evaluate symptoms and progress in treatment and Medication management  Continue Depakote DR 500 mg p.o. every 12 hours for mood stability Continue Haldol Decanoate 200 mg IM every 30 days last dose on 02/17/2019 Continue Vistaril 50 mg p.o. 3 times daily as needed for anxiety Continue naproxen 500 mg p.o. twice daily with meals Continue attempting Zyprexa Zydis 10 mg p.o. every 12 hours for schizophrenia Continue trazodone 100 mg p.o. nightly as needed for insomnia Start Invega Sustenna 234 mg IM every 28 days an adjunct to his Haldol Decanoate every 30 days to maintain stability Continue Zyprexa 15 mg IM every 12 hours as needed but is only required to be given if patient has been refusing his oral Zyprexa and Depakote Encourage group therapy participation Continue every 15 minute safety checks EKG has been ordered Labs to be ordered as valproic acid level, CBC, and CMP for monitoring.  [place], FNP 02/27/2019, 11:51 AM

## 2019-02-27 NOTE — BHH Counselor (Signed)
CSW left a vm for Natoria Gatling(person co DSS [phone]) to gather information on the pt's Medicaid. According to [place] supervisor, the pt was receiving medicaid in [place] and it was not properly transferred when he moved to [place].

## 2019-02-27 NOTE — Progress Notes (Signed)
Pt is barricading his door today with his mattress on the floor as he is lying it. It has urine on it. He also uses "white" remarks. Collier Bullock RN

## 2019-02-27 NOTE — Plan of Care (Signed)
  Problem: Education: Goal: Knowledge of [place] [facility] Education information/materials will improve Outcome: Progressing Goal: Emotional status will improve Outcome: Progressing Goal: Mental status will improve Outcome: Progressing Goal: Verbalization of understanding the information provided will improve Outcome: Progressing   Problem: Activity: Goal: Interest or engagement in activities will improve Outcome: Progressing Goal: Sleeping patterns will improve Outcome: Progressing   Problem: Coping: Goal: Ability to verbalize frustrations and anger appropriately will improve Outcome: Progressing Goal: Ability to demonstrate self-control will improve Outcome: Progressing   Problem: Health Behavior/Discharge Planning: Goal: Identification of resources available to assist in meeting health care needs will improve Outcome: Progressing Goal: Compliance with treatment plan for underlying cause of condition will improve Outcome: Progressing   Problem: Physical Regulation: Goal: Ability to maintain clinical measurements within normal limits will improve Outcome: Progressing   Problem: Safety: Goal: Periods of time without injury will increase Outcome: Progressing   

## 2019-02-27 NOTE — Progress Notes (Signed)
Recreation Therapy Notes  Date: 02/27/2019  Time: 9:30 am   Location: Craft room   Behavioral response: N/A   Intervention Topic: Leisure   Discussion/Intervention: Patient did not attend group.   Clinical Observations/Feedback:  Patient did not attend group.   Eddie Holland LRT/CTRS        [name] 02/27/2019 11:19 AM 

## 2019-02-27 NOTE — Progress Notes (Signed)
Patient did take his Pm medicines , without complains, patient did not deny or accept any passive or active suicidal or homicidal ideations , staff continue to monitor patient frequently and every 15 minutes for safety no distress.

## 2019-02-27 NOTE — Progress Notes (Signed)
Pt has refused labs today and EKG. MD and NP notified. Collier Bullock RN

## 2019-02-27 NOTE — [facility] Group Notes (Signed)
LCSW Group Therapy Note  02/27/2019 1:59 PM  Type of Therapy/Topic:  Group Therapy:  Balance in Life  Participation Level:  Did Not Attend  Description of Group:    This group will address the concept of balance and how it feels and looks when one is unbalanced. Patients will be encouraged to process areas in their lives that are out of balance and identify reasons for remaining unbalanced. Facilitators will guide patients in utilizing problem-solving interventions to address and correct the stressor making their life unbalanced. Understanding and applying boundaries will be explored and addressed for obtaining and maintaining a balanced life. Patients will be encouraged to explore ways to assertively make their unbalanced needs known to significant others in their lives, using other group members and facilitator for support and feedback.  Therapeutic Goals: 1. Patient will identify two or more emotions or situations they have that consume much of in their lives. 2. Patient will identify signs/triggers that life has become out of balance:  3. Patient will identify two ways to set boundaries in order to achieve balance in their lives:  4. Patient will demonstrate ability to communicate their needs through discussion and/or role plays  Summary of Patient Progress: x     Therapeutic Modalities:   Cognitive Behavioral Therapy Solution-Focused Therapy Assertiveness Training  Evalina Field, MSW, LCSW Clinical Social Work 02/27/2019 1:59 PM

## 2019-02-27 NOTE — Progress Notes (Signed)
Pt did go to group today and outside. Pt has refused all meds orally so had injections. Pt has not been disruptive this afternoon. Collier Bullock RN

## 2019-02-27 NOTE — Plan of Care (Signed)
Pt was unable to assess for the most part. Pt was educated on care plan. Pt received a forced medicine because he refused his oral meds.  Problem: Education: Goal: Knowledge of [facility] Education information/materials will improve Outcome: Not Progressing Goal: Emotional status will improve Outcome: Not Progressing Goal: Mental status will improve Outcome: Not Progressing Goal: Verbalization of understanding the information provided will improve Outcome: Not Progressing   Problem: Activity: Goal: Interest or engagement in activities will improve Outcome: Not Progressing Goal: Sleeping patterns will improve Outcome: Not Progressing   Problem: Coping: Goal: Ability to verbalize frustrations and anger appropriately will improve Outcome: Not Progressing Goal: Ability to demonstrate self-control will improve Outcome: Not Progressing   Problem: Health Behavior/Discharge Planning: Goal: Identification of resources available to assist in meeting health care needs will improve Outcome: Not Progressing Goal: Compliance with treatment plan for underlying cause of condition will improve Outcome: Not Progressing   Problem: Physical Regulation: Goal: Ability to maintain clinical measurements within normal limits will improve Outcome: Not Progressing   Problem: Safety: Goal: Periods of time without injury will increase Outcome: Not Progressing   Problem: Safety: Goal: Periods of time without injury will increase Outcome: Not Progressing

## 2019-02-28 DIAGNOSIS — F203 Undifferentiated schizophrenia: Secondary | ICD-10-CM | POA: Diagnosis not present

## 2019-02-28 NOTE — BHH Counselor (Signed)
CSW left confidential vm for Natoria Gatling at [place] [phone]( adult medicaid) to inquire about the pt's pending medicaid and transfer process to [facility] [employer].

## 2019-02-28 NOTE — Plan of Care (Signed)
Pt denies anxiety, depression, SI, HI and AVH. Pt was educated on care plan and verbalizes understanding. Collier Bullock RN Problem: Education: Goal: Knowledge of [facility] Education information/materials will improve Outcome: Progressing Goal: Emotional status will improve Outcome: Progressing Goal: Mental status will improve Outcome: Progressing Goal: Verbalization of understanding the information provided will improve Outcome: Progressing   Problem: Activity: Goal: Interest or engagement in activities will improve Outcome: Progressing Goal: Sleeping patterns will improve Outcome: Progressing   Problem: Coping: Goal: Ability to verbalize frustrations and anger appropriately will improve Outcome: Progressing Goal: Ability to demonstrate self-control will improve Outcome: Progressing   Problem: Health Behavior/Discharge Planning: Goal: Identification of resources available to assist in meeting health care needs will improve Outcome: Progressing Goal: Compliance with treatment plan for underlying cause of condition will improve Outcome: Progressing   Problem: Physical Regulation: Goal: Ability to maintain clinical measurements within normal limits will improve Outcome: Progressing   Problem: Safety: Goal: Periods of time without injury will increase Outcome: Progressing

## 2019-02-28 NOTE — Progress Notes (Signed)
Recreation Therapy Notes   Date: 02/28/2019  Time: 9:30 am  Location: Craft room  Behavioral response: Appropriate   Intervention Topic: Communication  Discussion/Intervention:  Group content today was focused on communication. The group defined communication and ways to communicate with others. Individuals stated reason why communication is important and some reasons to communicate with others. Patients expressed if they thought they were good at communicating with others and ways they could improve their communication skills. The group identified important parts of communication and some experiences they have had in the past with communication. The group participated in the intervention "What is that?", where they had a chance to test out their communication skills and identify ways to improve their communication techniques.  Clinical Observations/Feedback:  Patient came to group and defined communication as a reaction with others. He stated tone and tongue  are important parts of communication. Participant explained that when communicating you must think before you speak and act. Individual was social with peers and staff while participating in the intervention.    Carter Kaman LRT/CTRS         Cale Decarolis 02/28/2019 11:09 AM

## 2019-02-28 NOTE — Progress Notes (Signed)
[facility] MD Progress Note  02/28/2019 2:39 PM Uzoma Vivona  MRN:  [id number]   Subjective: There is a follow-up on the patient been diagnosed with schizophrenia.  Patient reports today that he is doing fine and continues to deny any suicidal or homicidal ideations and denies any hallucinations.  Patient continues to report almost the exact same every single day.  Patient continues to state that he feels that he is ready to be discharged from here.  Patient does admit to taking his medications as they have been ordered since yesterday.  Patient also reports that he is ready to leave and that he does not understand why he is still here.  Patient continues talking but he will allow me to speak.  Patient then gives me 2 additional phone numbers for his cousins and states that they are living at his grandfather's house and that he can go there and stay with them.  Principal Problem: Schizophrenia (HCC) Diagnosis: Principal Problem:   Schizophrenia (HCC)  Total Time spent with patient: 20 minutes  Past Psychiatric History: Patient is a notably poor historianbut scattered between chart review and patient verbal history it is ascertainedthat he has been diagnosed with schizophrenia or schizoaffective disorder for a long time.Patient reportsmultiple inpatienthospitalizations. He had previously lived in [place] but at some point move to [place] apparently after his wife passed away. He tells me that his mother is still living and that she lives in [place] and is his legal guardian. He has a lot of paranoid and disorganized animosity towards her. He denies ever having tried to kill himself. He rattles off multiple names of antipsychotics and mood stabilizers all of which she says have done him wrong and that he does not like. The only medicine he likes is either Haldol or Ativan.  Past Medical History:  Past Medical History:  Diagnosis Date  . Asthma   . Back pain   . Paranoia [facility])      Past Surgical History:  Procedure Laterality Date  . CHOLECYSTECTOMY     Family History: History reviewed. No pertinent family history. Family Psychiatric  History: Denies Social History:  Social History   Substance and Sexual Activity  Alcohol Use No     Social History   Substance and Sexual Activity  Drug Use No    Social History   Socioeconomic History  . Marital status: Married    Spouse name: Not on file  . Number of children: Not on file  . Years of education: Not on file  . Highest education level: Not on file  Occupational History  . Not on file  Social Needs  . Financial resource strain: Patient refused  . Food insecurity    Worry: Patient refused    Inability: Patient refused  . Transportation needs    Medical: Patient refused    Non-medical: Patient refused  Tobacco Use  . Smoking status: Never Smoker  . Smokeless tobacco: Never Used  Substance and Sexual Activity  . Alcohol use: No  . Drug use: No  . Sexual activity: Not Currently  Lifestyle  . Physical activity    Days per week: Patient refused    Minutes per session: Patient refused  . Stress: Patient refused  Relationships  . Social Musician on phone: Patient refused    Gets together: Patient refused    Attends religious service: Patient refused    Active member of club or organization: Patient refused    Attends  meetings of clubs or organizations: Patient refused    Relationship status: Patient refused  Other Topics Concern  . Not on file  Social History Narrative  . Not on file   Additional Social History:                         Sleep: Good  Appetite:  Good  Current Medications: Current Facility-Administered Medications  Medication Dose Route Frequency Provider Last Rate Last Dose  . acetaminophen (TYLENOL) tablet 650 mg  650 mg Oral Q6H PRN Clapacs, John T, MD      . alum & mag hydroxide-simeth (MAALOX/MYLANTA) 200-200-20 MG/5ML suspension 30 mL  30  mL Oral Q4H PRN Clapacs, Madie Reno, MD   30 mL at 02/20/19 2048  . divalproex (DEPAKOTE) DR tablet 500 mg  500 mg Oral Q12H Clapacs, Madie Reno, MD   500 mg at 02/27/19 1918  . fluticasone (FLONASE) 50 MCG/ACT nasal spray 2 spray  2 spray Each Nare Daily Clapacs, John T, MD   2 spray at 02/20/19 0900  . haloperidol decanoate (HALDOL DECANOATE) 100 MG/ML injection 200 mg  200 mg Intramuscular Q30 days Jung Yurchak, [place] B, FNP   200 mg at 02/17/19 1644  . hydrOXYzine (ATARAX/VISTARIL) tablet 50 mg  50 mg Oral TID PRN Khaya Theissen, Lowry Ram, FNP   50 mg at 02/19/19 2007  . magnesium hydroxide (MILK OF MAGNESIA) suspension 30 mL  30 mL Oral Daily PRN Clapacs, John T, MD      . naproxen (NAPROSYN) tablet 500 mg  500 mg Oral BID WC Mickaela Starlin, Darnelle Maffucci B, FNP   500 mg at 02/28/19 8850  . OLANZapine (ZYPREXA) injection 15 mg  15 mg Intramuscular Q12H PRN Clapacs, Madie Reno, MD   15 mg at 02/27/19 2136  . OLANZapine zydis (ZYPREXA) disintegrating tablet 10 mg  10 mg Oral Q12H Clapacs, Madie Reno, MD   10 mg at 02/28/19 0853  . paliperidone (INVEGA SUSTENNA) injection 234 mg  234 mg Intramuscular Q28 days Hanaa Payes, Lowry Ram, FNP   234 mg at 02/27/19 1224  . traZODone (DESYREL) tablet 100 mg  100 mg Oral QHS PRN Sharma Covert, MD   100 mg at 02/19/19 2012    Lab Results: No results found for this or any previous visit (from the past 48 hour(s)).  Blood Alcohol level:  Lab Results  Component Value Date   ETH <10 02/12/2019   ETH <10 27/74/1287    Metabolic Disorder Labs: No results found for: HGBA1C, MPG No results found for: PROLACTIN Lab Results  Component Value Date   CHOL 137 02/14/2019   TRIG 42 02/14/2019   HDL 56 02/14/2019   CHOLHDL 2.4 02/14/2019   VLDL 8 02/14/2019   LDLCALC 73 02/14/2019    Physical Findings: AIMS:  , ,  ,  ,    CIWA:    COWS:     Musculoskeletal: Strength & Muscle Tone: within normal limits Gait & Station: normal Patient leans: N/A  Psychiatric Specialty Exam: Physical Exam   Nursing note and vitals reviewed. Constitutional: He is oriented to person, place, and time. He appears well-developed and well-nourished.  Cardiovascular: Normal rate.  Respiratory: Effort normal.  Musculoskeletal: Normal range of motion.  Neurological: He is alert and oriented to person, place, and time.  Skin: Skin is warm.    Review of Systems  Constitutional: Negative.   HENT: Negative.   Eyes: Negative.   Respiratory: Negative.   Cardiovascular: Negative.  Gastrointestinal: Negative.   Genitourinary: Negative.   Musculoskeletal: Negative.   Skin: Negative.   Neurological: Negative.   Endo/Heme/Allergies: Negative.   Psychiatric/Behavioral: Negative.     Blood pressure 125/90, pulse 92, temperature 98.1 F (36.7 C), temperature source Oral, resp. rate 17, height 6' (1.829 m), weight 127 kg, SpO2 97 %.Body mass index is 37.97 kg/m.  General Appearance: Guarded  Eye Contact:  Good  Speech:  Clear and Coherent  Volume:  Increased  Mood:  Irritable  Affect:  Flat  Thought Process:  Linear and Descriptions of Associations: Loose  Orientation:  Full (Time, Place, and Person)  Thought Content:  Illogical and Paranoid Ideation  Suicidal Thoughts:  No  Homicidal Thoughts:  No  Memory:  Immediate;   Good Recent;   Good Remote;   Good  Judgement:  Impaired  Insight:  Lacking  Psychomotor Activity:  Normal  Concentration:  Concentration: Good  Recall:  Good  Fund of Knowledge:  Good  Language:  Fair  Akathisia:  No  Handed:  Right  AIMS (if indicated):     Assets:  Communication Skills Resilience  ADL's:  Intact  Cognition:  WNL  Sleep:  Number of Hours: 5.25   Assessment: Patient presents in his room and is pleasant, calm, and cooperative.  Patient has been attending groups and has been interacting with peers and staff appropriately but still somewhat quiet.  Patient has not shown any type of behavior that appears to be aggressive or agitated.  Patient speech is  harsh and tone and he is intrusive with his conversation and does allow you to answer his questions.  Patient does show some improvement since he started taking his medications.  Plan will be to discontinue the orals hopefully soon as patient did receive the Haldol Decanoate 200 mg IM last week and yesterday received the Invega Sustenna 234 mg IM injection.  Names and phone numbers with patient's 2 cousins was provided to social work department for them to contact for possible safe discharge.  Patient continues to feel that we are keeping him in the hospital just to force medications on him and continues to not allow for understanding and explanation of treatment and symptoms of his diagnosis.    Treatment Plan Summary: Daily contact with patient to assess and evaluate symptoms and progress in treatment and Medication management Continue trazodone 100 mg p.o. nightly as needed for insomnia Continue Invega Sustenna 234 mg IM every 28 days with last dose being 02/27/2019 Continue Zyprexa Zydis 10 mg p.o. every 12 hours for schizophrenia Continue Vistaril 50 mg p.o. 3 times daily as needed for anxiety Continue Haldol Decanoate 200 mg IM every 30 days with last dose on 02/17/2019 Continue Depakote 500 mg p.o. every 12 hours for mood stability Encourage group therapy participation Social work continue to work on Architectdisposition planning Continue every 15 minute safety checks  Maryfrances Bunnellravis B Paulla Mcclaskey, FNP 02/28/2019, 2:39 PM

## 2019-02-28 NOTE — Progress Notes (Signed)
Pr has attended groups and taken oral meds this morning except one. He wants to hold the one due now for later. He has been much calmer and cooperative today. Collier Bullock RN

## 2019-02-28 NOTE — BHH Group Notes (Signed)
LCSW Group Therapy Note   02/28/2019 1:00 PM  Type of Therapy and Topic:  Group Therapy:  Overcoming Obstacles   Participation Level:  None   Description of Group:    In this group patients will be encouraged to explore what they see as obstacles to their own wellness and recovery. They will be guided to discuss their thoughts, feelings, and behaviors related to these obstacles. The group will process together ways to cope with barriers, with attention given to specific choices patients can make. Each patient will be challenged to identify changes they are motivated to make in order to overcome their obstacles. This group will be process-oriented, with patients participating in exploration of their own experiences as well as giving and receiving support and challenge from other group members.   Therapeutic Goals: 1. Patient will identify personal and current obstacles as they relate to admission. 2. Patient will identify barriers that currently interfere with their wellness or overcoming obstacles.  3. Patient will identify feelings, thought process and behaviors related to these barriers. 4. Patient will identify two changes they are willing to make to overcome these obstacles:      Summary of Patient Progress Pt slept in group.     Therapeutic Modalities:   Cognitive Behavioral Therapy Solution Focused Therapy Motivational Interviewing Relapse Prevention Therapy  Assunta Curtis, MSW, LCSW 02/28/2019 12:52 PM

## 2019-03-01 DIAGNOSIS — F200 Paranoid schizophrenia: Secondary | ICD-10-CM | POA: Diagnosis not present

## 2019-03-01 NOTE — BHH Group Notes (Signed)
LCSW Aftercare Discharge Planning Group Note   03/01/2019 1300  Type of Group and Topic: Psychoeducational Group:  Discharge Planning  Participation Level:  Did Not Attend  Description of Group  Discharge planning group reviews patient's anticipated discharge plans and assists patients to anticipate and address any barriers to wellness/recovery in the community.  Suicide prevention education is reviewed with patients in group.  Therapeutic Goals 1. Patients will state their anticipated discharge plan and mental health aftercare 2. Patients will identify potential barriers to wellness in the community setting 3. Patients will engage in problem solving, solution focused discussion of ways to anticipate and address barriers to wellness/recovery  Summary of Patient Progress   Plan for Discharge/Comments:    Transportation Means:   Supports:  Therapeutic Modalities: Motivational Interviewing    Joanne Chars, [place] 03/01/2019 2:18 PM

## 2019-03-01 NOTE — Progress Notes (Signed)
[facility] MD Progress Note  03/01/2019 9:38 AM Eddie Holland  MRN:  [id number] Subjective:    This 58 year old schizophrenic patient was sent from his group home under petition for involuntary commitment he had become belligerent they are refusing certain medications so forth.  The patient himself states that he is ready to go back, he denies current auditory or visual hallucinations he states that "people another counties take my money and do not like me" and rambles about the fact that he has been mistreated, his money has been stolen so forth.  The content of his discussions are paranoid but there are no specifics given.  No EPS or TD.  Patient somewhat attention seeking requests multiple interviews throughout the morning even though I am rounding with other patients.  He is redirected multiple times.  Somewhat hypomanic at intervals.  Principal Problem: Schizophrenia ([place]) Diagnosis: Principal Problem:   Schizophrenia ([place])  Total Time spent with patient: 20 minutes  Past Psychiatric History: Multiple admissions  Past Medical History:  Past Medical History:  Diagnosis Date  . Asthma   . Back pain   . Paranoia [facility])     Past Surgical History:  Procedure Laterality Date  . CHOLECYSTECTOMY     Family History: History reviewed. No pertinent family history. Family Psychiatric  History: Shares no new data Social History:  Social History   Substance and Sexual Activity  Alcohol Use No     Social History   Substance and Sexual Activity  Drug Use No    Social History   Socioeconomic History  . Marital status: Married    Spouse name: Not on file  . Number of children: Not on file  . Years of education: Not on file  . Highest education level: Not on file  Occupational History  . Not on file  Social Needs  . Financial resource strain: Patient refused  . Food insecurity    Worry: Patient refused    Inability: Patient refused  . Transportation needs    Medical: Patient  refused    Non-medical: Patient refused  Tobacco Use  . Smoking status: Never Smoker  . Smokeless tobacco: Never Used  Substance and Sexual Activity  . Alcohol use: No  . Drug use: No  . Sexual activity: Not Currently  Lifestyle  . Physical activity    Days per week: Patient refused    Minutes per session: Patient refused  . Stress: Patient refused  Relationships  . Social Herbalist on phone: Patient refused    Gets together: Patient refused    Attends religious service: Patient refused    Active member of club or organization: Patient refused    Attends meetings of clubs or organizations: Patient refused    Relationship status: Patient refused  Other Topics Concern  . Not on file  Social History Narrative  . Not on file  Depakote level therapeutic at 82 on 10/12 new level pending from 10/29  Sleep: Fair  Appetite:  Good  Current Medications: Current Facility-Administered Medications  Medication Dose Route Frequency Provider Last Rate Last Dose  . acetaminophen (TYLENOL) tablet 650 mg  650 mg Oral Q6H PRN Clapacs, John T, MD      . alum & mag hydroxide-simeth (MAALOX/MYLANTA) 200-200-20 MG/5ML suspension 30 mL  30 mL Oral Q4H PRN Clapacs, Madie Reno, MD   30 mL at 02/20/19 2048  . divalproex (DEPAKOTE) DR tablet 500 mg  500 mg Oral Q12H Clapacs, Madie Reno, MD   500  mg at 03/01/19 0727  . fluticasone (FLONASE) 50 MCG/ACT nasal spray 2 spray  2 spray Each Nare Daily Clapacs, John T, MD   2 spray at 02/20/19 0900  . haloperidol decanoate (HALDOL DECANOATE) 100 MG/ML injection 200 mg  200 mg Intramuscular Q30 days Money, [place] B, FNP   200 mg at 02/17/19 1644  . hydrOXYzine (ATARAX/VISTARIL) tablet 50 mg  50 mg Oral TID PRN Money, Gerlene Burdock, FNP   50 mg at 02/19/19 2007  . magnesium hydroxide (MILK OF MAGNESIA) suspension 30 mL  30 mL Oral Daily PRN Clapacs, John T, MD      . naproxen (NAPROSYN) tablet 500 mg  500 mg Oral BID WC Money, Feliz Beam B, FNP   500 mg at 02/28/19 7672   . OLANZapine (ZYPREXA) injection 15 mg  15 mg Intramuscular Q12H PRN Clapacs, Jackquline Denmark, MD   15 mg at 02/27/19 2136  . OLANZapine zydis (ZYPREXA) disintegrating tablet 10 mg  10 mg Oral Q12H Clapacs, Jackquline Denmark, MD   10 mg at 03/01/19 0727  . paliperidone (INVEGA SUSTENNA) injection 234 mg  234 mg Intramuscular Q28 days Money, Gerlene Burdock, FNP   234 mg at 02/27/19 1224  . traZODone (DESYREL) tablet 100 mg  100 mg Oral QHS PRN Antonieta Pert, MD   100 mg at 02/28/19 2207    Lab Results: No results found for this or any previous visit (from the past 48 hour(s)).  Blood Alcohol level:  Lab Results  Component Value Date   ETH <10 02/12/2019   ETH <10 02/10/2019    Metabolic Disorder Labs: No results found for: HGBA1C, MPG No results found for: PROLACTIN Lab Results  Component Value Date   CHOL 137 02/14/2019   TRIG 42 02/14/2019   HDL 56 02/14/2019   CHOLHDL 2.4 02/14/2019   VLDL 8 02/14/2019   LDLCALC 73 02/14/2019    Physical Findings: AIMS:  , ,  ,  ,    CIWA:    COWS:     Musculoskeletal: Strength & Muscle Tone: within normal limits Gait & Station: normal Patient leans: N/A  Psychiatric Specialty Exam: Physical Exam  ROS  Blood pressure 125/90, pulse 92, temperature 98.1 F (36.7 C), temperature source Oral, resp. rate 17, height 6' (1.829 m), weight 127 kg, SpO2 97 %.Body mass index is 37.97 kg/m.  General Appearance: Casual  Eye Contact:  Fair  Speech:  Pressured  Volume:  Increased  Mood:  Irritable and Intervals of hypomania  Affect:  Congruent  Thought Process:  Linear  Orientation:  Full (Time, Place, and Person) not exact date  Thought Content:  Delusions and Paranoid Ideation  Suicidal Thoughts:  No  Homicidal Thoughts:  No  Memory:  Immediate;   Fair Recent;   Fair Remote;   Fair  Judgement:  Impaired  Insight:  Lacking  Psychomotor Activity:  Normal  Concentration:  Concentration: Poor and Attention Span: Poor  Recall:  Poor  Fund of Knowledge:   Fair  Language:  Rambling and pressured at times  Akathisia:  Negative  Handed:  Right  AIMS (if indicated):     Assets:  Manufacturing systems engineer Leisure Time Physical Health Resilience Social Support  ADL's:  Intact  Cognition:  WNL  Sleep:  Number of Hours: 7.75     Treatment Plan Summary: Daily contact with patient to assess and evaluate symptoms and progress in treatment and Medication management  Continue Depakote for mood stabilization await level, continue combination antipsychotic therapy for  outbursts and difficulties with with paranoia/chronic psychosis no change in precautions/continue reality based therapy  Malvin JohnsFARAH,Ahriana Gunkel, MD 03/01/2019, 9:38 AM

## 2019-03-01 NOTE — Progress Notes (Signed)
Patient ia alert and oriented x 4 thoughts are dorganized and coherent, he denies SI/HI/AVH but appears responding to internal stimuli, he was evasive , unwillingly to participate in treatment plan after much persuasion he came to medication room and was compliant with nighttime prescribed medication. However, patient didn't interact appropriately in the dayroom, he was noted, using profanities, causing at the male patients, making gestures at patient;s in there dayroom, and threatening them., he appears irritable, and suspicious of his peers, patient is not nteracting appropriately with peers and staff, he was often reoriented and advised to respect other patients.  15 minutes safety checks maintained will continue to monitor

## 2019-03-01 NOTE — Tx Team (Signed)
Interdisciplinary Treatment and Diagnostic Plan Update  03/01/2019 Time of Session: [place] MRN: [id number]  Principal Diagnosis: Schizophrenia [facility])  Secondary Diagnoses: Principal Problem:   Schizophrenia ([place])   Current Medications:  Current Facility-Administered Medications  Medication Dose Route Frequency Provider Last Rate Last Dose  . acetaminophen (TYLENOL) tablet 650 mg  650 mg Oral Q6H PRN Clapacs, John T, MD      . alum & mag hydroxide-simeth (MAALOX/MYLANTA) 200-200-20 MG/5ML suspension 30 mL  30 mL Oral Q4H PRN Clapacs, Madie Reno, MD   30 mL at 02/20/19 2048  . divalproex (DEPAKOTE) DR tablet 500 mg  500 mg Oral Q12H Clapacs, Madie Reno, MD   500 mg at 03/01/19 0727  . fluticasone (FLONASE) 50 MCG/ACT nasal spray 2 spray  2 spray Each Nare Daily Clapacs, John T, MD   2 spray at 02/20/19 0900  . haloperidol decanoate (HALDOL DECANOATE) 100 MG/ML injection 200 mg  200 mg Intramuscular Q30 days Money, Bluff B, FNP   200 mg at 02/17/19 1644  . hydrOXYzine (ATARAX/VISTARIL) tablet 50 mg  50 mg Oral TID PRN Money, Lowry Ram, FNP   50 mg at 02/19/19 2007  . magnesium hydroxide (MILK OF MAGNESIA) suspension 30 mL  30 mL Oral Daily PRN Clapacs, John T, MD      . naproxen (NAPROSYN) tablet 500 mg  500 mg Oral BID WC Money, Darnelle Maffucci B, FNP   500 mg at 02/28/19 2992  . OLANZapine (ZYPREXA) injection 15 mg  15 mg Intramuscular Q12H PRN Clapacs, Madie Reno, MD   15 mg at 02/27/19 2136  . OLANZapine zydis (ZYPREXA) disintegrating tablet 10 mg  10 mg Oral Q12H Clapacs, Madie Reno, MD   10 mg at 03/01/19 0727  . paliperidone (INVEGA SUSTENNA) injection 234 mg  234 mg Intramuscular Q28 days Money, Lowry Ram, FNP   234 mg at 02/27/19 1224  . traZODone (DESYREL) tablet 100 mg  100 mg Oral QHS PRN Sharma Covert, MD   100 mg at 02/28/19 2207   PTA Medications: No medications prior to admission.    Patient Stressors: Financial difficulties Health problems Other: Living arrangement  Patient  Strengths: Ability for insight Communication skills Motivation for treatment/growth  Treatment Modalities: Medication Management, Group therapy, Case management,  1 to 1 session with clinician, Psychoeducation, Recreational therapy.   Physician Treatment Plan for Primary Diagnosis: Schizophrenia ([place]) Long Term Goal(s): Improvement in symptoms so as ready for discharge Improvement in symptoms so as ready for discharge   Short Term Goals: Ability to verbalize feelings will improve Ability to demonstrate self-control will improve Ability to identify and develop effective coping behaviors will improve Compliance with prescribed medications will improve  Medication Management: Evaluate patient's response, side effects, and tolerance of medication regimen.  Therapeutic Interventions: 1 to 1 sessions, Unit Group sessions and Medication administration.  Evaluation of Outcomes: Progressing  Physician Treatment Plan for Secondary Diagnosis: Principal Problem:   Schizophrenia ([place])  Long Term Goal(s): Improvement in symptoms so as ready for discharge Improvement in symptoms so as ready for discharge   Short Term Goals: Ability to verbalize feelings will improve Ability to demonstrate self-control will improve Ability to identify and develop effective coping behaviors will improve Compliance with prescribed medications will improve     Medication Management: Evaluate patient's response, side effects, and tolerance of medication regimen.  Therapeutic Interventions: 1 to 1 sessions, Unit Group sessions and Medication administration.  Evaluation of Outcomes: Progressing   RN Treatment Plan for Primary Diagnosis:  Schizophrenia (HCC) Long Term Goal(s): Knowledge of disease and therapeutic regimen to maintain health will improve  Short Term Goals: Ability to demonstrate self-control, Ability to participate in decision making will improve, Ability to identify and develop effective coping  behaviors will improve and Compliance with prescribed medications will improve  Medication Management: RN will administer medications as ordered by provider, will assess and evaluate patient's response and provide education to patient for prescribed medication. RN will report any adverse and/or side effects to prescribing provider.  Therapeutic Interventions: 1 on 1 counseling sessions, Psychoeducation, Medication administration, Evaluate responses to treatment, Monitor vital signs and CBGs as ordered, Perform/monitor CIWA, COWS, AIMS and Fall Risk screenings as ordered, Perform wound care treatments as ordered.  Evaluation of Outcomes: Progressing   LCSW Treatment Plan for Primary Diagnosis: Schizophrenia (HCC) Long Term Goal(s): Safe transition to appropriate next level of care at discharge, Engage patient in therapeutic group addressing interpersonal concerns.  Short Term Goals: Engage patient in aftercare planning with referrals and resources and Increase skills for wellness and recovery  Therapeutic Interventions: Assess for all discharge needs, 1 to 1 time with Social worker, Explore available resources and support systems, Assess for adequacy in community support network, Educate family and significant other(s) on suicide prevention, Complete Psychosocial Assessment, Interpersonal group therapy.  Evaluation of Outcomes: Progressing   Progress in Treatment: Attending groups: Yes. Participating in groups: No. Taking medication as prescribed: Yes. Toleration medication: Yes. Family/Significant other contact made: Yes, individual(s) contacted:  pts cousin and payee Patient understands diagnosis: No. Discussing patient identified problems/goals with staff: Yes. Medical problems stabilized or resolved: Yes. Denies suicidal/homicidal ideation: Yes. Issues/concerns per patient self-inventory: No. Other: NA  New problem(s) identified: No, Describe:  None reported  New Short Term/Long  Term Goal(s):  Attend outpatient treatment, take medication as prescribed, develop and implement healthy coping methods Update 02/24/2019:  elimination of symptoms of psychosis, medication management for mood stabilization; elimination of SI thoughts; development of comprehensive mental wellness.  Patient Goals:  "Go back to my state"  Discharge Plan or Barriers: Pt will return to his group home and follow up with outpatient treatment. Update 02/18/19: SPE pamphlet, Mobile Crisis information, and AA/NA information provided to patient for additional community support and resources. Pt has an appointment scheduled with [facility] on 10/26 at 2:30. Pt also has been tentatively accepted at [facility].  Update 02/24/2019:  Patient has begun to decline his oral medications since he has been given a injection, due to this the patient has begun to decompensate.  Patient was not admitted to [facility] since there was no guarantee of payment without patient having medicaid. It is possible that with payment patient could go here. Patient states daily different locations that he would like to be discharged to.  Reason for Continuation of Hospitalization: Medication stabilization  Estimated Length of Stay: 3-5 days  Attendees: Patient: 03/01/2019   Physician: Dr Jeannine Kitten MD 03/01/2019   Nursing: Addison Naegeli, RN 03/01/2019   RN Care Manager: 03/01/2019   Social Worker: Daleen Squibb, LCSW 03/01/2019   Recreational Therapist:  03/01/2019   Other:  03/01/2019   Other:  03/01/2019   Other: 03/01/2019        Scribe for Treatment Team: Lorri Frederick, LCSW 03/01/2019 11:09 AM

## 2019-03-01 NOTE — Progress Notes (Signed)
Patient is disorganized in thoughts  and irritable,   Isolate self and has his mattress in his bathroom  And sleeps there, refuse to engage in any lucid productive conversations , mood is pre-occupied and affect is apathetic , labile . Complying with with his medication regimen, refusing any social therapy  And poor coping skills , teaching  And encouragement is provided to improve mental and emotional state , no somatic symptoms , denies SI/HI/AVH no distress safety is maintained and 15 minutes checks is in progress.

## 2019-03-01 NOTE — Plan of Care (Signed)
  Problem: Education: Goal: Emotional status will improve Outcome: Progressing  Patient after an outburst in the dayroom, later became sad and remorseful , he believes he can do better.

## 2019-03-01 NOTE — Plan of Care (Signed)
Patient denies SI/HI/AVH. Patient is argumentative at time. Patient is seen interacting with peers in day room. Patient safety is maintained on the unit.    Problem: Education: Goal: Knowledge of [place] [facility] Education information/materials will improve Outcome: Not Progressing Goal: Emotional status will improve Outcome: Not Progressing Goal: Mental status will improve Outcome: Not Progressing

## 2019-03-01 NOTE — Plan of Care (Signed)
  Problem: Education: Goal: Knowledge of [place] [facility] Education information/materials will improve Outcome: Progressing Goal: Emotional status will improve Outcome: Progressing Goal: Mental status will improve Outcome: Progressing Goal: Verbalization of understanding the information provided will improve Outcome: Progressing   Problem: Activity: Goal: Interest or engagement in activities will improve Outcome: Progressing Goal: Sleeping patterns will improve Outcome: Progressing   Problem: Coping: Goal: Ability to verbalize frustrations and anger appropriately will improve Outcome: Progressing Goal: Ability to demonstrate self-control will improve Outcome: Progressing   Problem: Health Behavior/Discharge Planning: Goal: Identification of resources available to assist in meeting health care needs will improve Outcome: Progressing Goal: Compliance with treatment plan for underlying cause of condition will improve Outcome: Progressing   Problem: Physical Regulation: Goal: Ability to maintain clinical measurements within normal limits will improve Outcome: Progressing   Problem: Safety: Goal: Periods of time without injury will increase Outcome: Progressing   

## 2019-03-02 MED ORDER — OLANZAPINE 10 MG PO TBDP
10.0000 mg | ORAL_TABLET | Freq: Three times a day (TID) | ORAL | Status: DC
  Administered 2019-03-02 – 2019-03-06 (×10): 10 mg via ORAL
  Filled 2019-03-02 (×2): qty 1
  Filled 2019-03-02: qty 2
  Filled 2019-03-02: qty 1
  Filled 2019-03-02 (×2): qty 2
  Filled 2019-03-02: qty 1
  Filled 2019-03-02: qty 2
  Filled 2019-03-02: qty 1
  Filled 2019-03-02 (×2): qty 2
  Filled 2019-03-02 (×2): qty 1
  Filled 2019-03-02 (×4): qty 2
  Filled 2019-03-02 (×2): qty 1

## 2019-03-02 NOTE — Plan of Care (Signed)
D- Patient alert and oriented. Patient presents in a pleasant mood on assessment stating that he slept "good" last night and had no complaints or concerns to voice to this Probation officer. Patient denies SI, HI, AVH, and pain at this time. Patient also denies any signs/symptoms of depression and anxiety. Patient's goal for today is "forgiveness", in which he will "stay alert" in order to accomplish his goal.  A- Scheduled medications administered to patient, per MD orders. Support and encouragement provided.  Routine safety checks conducted every 15 minutes.  Patient informed to notify staff with problems or concerns.  R- No adverse drug reactions noted. Patient contracts for safety at this time. Patient compliant with medications and treatment plan. Patient receptive, calm, and cooperative. Patient interacts well with others on the unit.  Patient remains safe at this time.  Problem: Education: Goal: Knowledge of [place] [facility] Education information/materials will improve Outcome: Progressing Goal: Emotional status will improve Outcome: Progressing Goal: Mental status will improve Outcome: Progressing Goal: Verbalization of understanding the information provided will improve Outcome: Progressing   Problem: Activity: Goal: Interest or engagement in activities will improve Outcome: Progressing Goal: Sleeping patterns will improve Outcome: Progressing   Problem: Coping: Goal: Ability to verbalize frustrations and anger appropriately will improve Outcome: Progressing Goal: Ability to demonstrate self-control will improve Outcome: Progressing   Problem: Health Behavior/Discharge Planning: Goal: Identification of resources available to assist in meeting health care needs will improve Outcome: Progressing Goal: Compliance with treatment plan for underlying cause of condition will improve Outcome: Progressing   Problem: Physical Regulation: Goal: Ability to maintain clinical measurements  within normal limits will improve Outcome: Progressing   Problem: Safety: Goal: Periods of time without injury will increase Outcome: Progressing

## 2019-03-02 NOTE — Progress Notes (Signed)
[facility] MD Progress Note  03/02/2019 10:32 AM Eddie Holland  MRN:  [id number] Subjective:    Patient seen he is still somewhat intrusive rambling and pressured he states "I live in [place]" and rambles about getting back there he denies positive symptoms he is at times irritable at other times cooperative with interview process.  He does deny auditory or visual hallucinations he is oriented to person place situation makes several disjointed statements but is not described as delusional No EPS or TD  Principal Problem: Schizophrenia ([place]) Diagnosis: Principal Problem:   Schizophrenia ([place])  Total Time spent with patient: 20 minutes  Past Psychiatric History: Extensive  Past Medical History:  Past Medical History:  Diagnosis Date  . Asthma   . Back pain   . Paranoia [facility])     Past Surgical History:  Procedure Laterality Date  . CHOLECYSTECTOMY     Family History: History reviewed. No pertinent family history. Family Psychiatric  History: No new data Social History:  Social History   Substance and Sexual Activity  Alcohol Use No     Social History   Substance and Sexual Activity  Drug Use No    Social History   Socioeconomic History  . Marital status: Married    Spouse name: Not on file  . Number of children: Not on file  . Years of education: Not on file  . Highest education level: Not on file  Occupational History  . Not on file  Social Needs  . Financial resource strain: Patient refused  . Food insecurity    Worry: Patient refused    Inability: Patient refused  . Transportation needs    Medical: Patient refused    Non-medical: Patient refused  Tobacco Use  . Smoking status: Never Smoker  . Smokeless tobacco: Never Used  Substance and Sexual Activity  . Alcohol use: No  . Drug use: No  . Sexual activity: Not Currently  Lifestyle  . Physical activity    Days per week: Patient refused    Minutes per session: Patient refused  . Stress: Patient refused   Relationships  . Social Herbalist on phone: Patient refused    Gets together: Patient refused    Attends religious service: Patient refused    Active member of club or organization: Patient refused    Attends meetings of clubs or organizations: Patient refused    Relationship status: Patient refused  Other Topics Concern  . Not on file  Social History Narrative  . Not on file   Additional Social History:                         Sleep: Poor  Appetite:  Fair  Current Medications: Current Facility-Administered Medications  Medication Dose Route Frequency Provider Last Rate Last Dose  . acetaminophen (TYLENOL) tablet 650 mg  650 mg Oral Q6H PRN Clapacs, John T, MD      . alum & mag hydroxide-simeth (MAALOX/MYLANTA) 200-200-20 MG/5ML suspension 30 mL  30 mL Oral Q4H PRN Clapacs, Madie Reno, MD   30 mL at 02/20/19 2048  . divalproex (DEPAKOTE) DR tablet 500 mg  500 mg Oral Q12H Clapacs, Madie Reno, MD   500 mg at 03/02/19 0837  . fluticasone (FLONASE) 50 MCG/ACT nasal spray 2 spray  2 spray Each Nare Daily Clapacs, John T, MD   2 spray at 02/20/19 0900  . haloperidol decanoate (HALDOL DECANOATE) 100 MG/ML injection 200 mg  200 mg  Intramuscular Q30 days Money, Gerlene Burdock, [place]   200 mg at 02/17/19 1644  . hydrOXYzine (ATARAX/VISTARIL) tablet 50 mg  50 mg Oral TID PRN Money, Gerlene Burdock, FNP   50 mg at 02/19/19 2007  . magnesium hydroxide (MILK OF MAGNESIA) suspension 30 mL  30 mL Oral Daily PRN Clapacs, John T, MD      . naproxen (NAPROSYN) tablet 500 mg  500 mg Oral BID WC Money, Feliz Beam B, FNP   500 mg at 03/02/19 0837  . OLANZapine (ZYPREXA) injection 15 mg  15 mg Intramuscular Q12H PRN Clapacs, Jackquline Denmark, MD   15 mg at 02/27/19 2136  . OLANZapine zydis (ZYPREXA) disintegrating tablet 10 mg  10 mg Oral TID Malvin Johns, MD      . paliperidone [facility] SUSTENNA) injection 234 mg  234 mg Intramuscular Q28 days Money, Gerlene Burdock, FNP   234 mg at 02/27/19 1224  . traZODone (DESYREL)  tablet 100 mg  100 mg Oral QHS PRN Antonieta Pert, MD   100 mg at 02/28/19 2207    Lab Results: No results found for this or any previous visit (from the past 48 hour(s)).  Blood Alcohol level:  Lab Results  Component Value Date   ETH <10 02/12/2019   ETH <10 02/10/2019    Metabolic Disorder Labs: No results found for: HGBA1C, MPG No results found for: PROLACTIN Lab Results  Component Value Date   CHOL 137 02/14/2019   TRIG 42 02/14/2019   HDL 56 02/14/2019   CHOLHDL 2.4 02/14/2019   VLDL 8 02/14/2019   LDLCALC 73 02/14/2019    Physical Findings: AIMS:  , ,  ,  ,    CIWA:    COWS:     Musculoskeletal: Strength & Muscle Tone: within normal limits Gait & Station: normal Patient leans: N/A  Psychiatric Specialty Exam: Physical Exam  ROS  Blood pressure 134/77, pulse 86, temperature 97.9 F (36.6 C), temperature source Oral, resp. rate 18, height 6' (1.829 m), weight 127 kg, SpO2 94 %.Body mass index is 37.97 kg/m.  General Appearance: Casual  Eye Contact:  Fair  Speech:  Pressured  Volume:  Increased  Mood:  Irritable and Hypomanic  Affect:  Congruent  Thought Process:  Linear and Descriptions of Associations: Circumstantial  Orientation:  Full (Time, Place, and Person)  Thought Content:  Illogical and Tangential  Suicidal Thoughts:  No  Homicidal Thoughts:  No  Memory:  Recent;   Fair Remote;   Good  Judgement:  Fair  Insight:  Fair  Psychomotor Activity:  Normal  Concentration:  Concentration: Fair and Attention Span: Fair  Recall:  [employer] of Knowledge:  Fair  Language:  Fair  Akathisia:  Negative  Handed:  Right  AIMS (if indicated):     Assets:  Leisure Time Physical Health Resilience  ADL's:  Intact  Cognition:  WNL  Sleep:  Number of Hours: 5.75     Treatment Plan Summary: Daily contact with patient to assess and evaluate symptoms and progress in treatment and Medication management  Continue but escalate Zyprexa continue  antipsychotic therapy in combination continue reality based therapy and current precautions check EKG in the morning with escalation of dose initiated today  Ethal Gotay, MD 03/02/2019, 10:32 AM

## 2019-03-02 NOTE — [facility] Group Notes (Signed)
LCSW Group Therapy Note 03/02/2019 1:15pm  Type of Therapy and Topic: Group Therapy: Feelings Around Returning Home & Establishing a Supportive Framework and Supporting Oneself When Supports Not Available  Participation Level: Did Not Attend  Description of Group:  Patients first processed thoughts and feelings about upcoming discharge. These included fears of upcoming changes, lack of change, new living environments, judgements and expectations from others and overall stigma of mental health issues. The group then discussed the definition of a supportive framework, what that looks and feels like, and how do to discern it from an unhealthy non-supportive network. The group identified different types of supports as well as what to do when your family/friends are less than helpful or unavailable  Therapeutic Goals  1. Patient will identify one healthy supportive network that they can use at discharge. 2. Patient will identify one factor of a supportive framework and how to tell it from an unhealthy network. 3. Patient able to identify one coping skill to use when they do not have positive supports from others. 4. Patient will demonstrate ability to communicate their needs through discussion and/or role plays.  Summary of Patient Progress:  Pt was invited to attend group but chose not to attend. CSW will continue to encourage pt to attend group throughout their admission.    Therapeutic Modalities Cognitive Behavioral Therapy Motivational Interviewing   Cheree Ditto, LCSW 03/02/2019 12:38 PM

## 2019-03-03 NOTE — BHH Counselor (Signed)
CSW attempted to contact Luisa Dago, payee/cousin, [phone] of the patient to identify if patient has income.  CSW was unable to speak with the payee and left a HIPAA compliant voicemail requesting a return phone call.   Assunta Curtis, MSW, LCSW 03/03/2019 1:38 PM

## 2019-03-03 NOTE — B[facility] Counselor (Signed)
CSW received a return call from [facility], payee/cousin, [phone].  CSW asked if cousin remained Eddie payee.  CSW was informed that Eddie Holland received a letter requesting verification on certain things or Eddie disability would end.  She reports that when she called to verify she was informed that Eddie patient had also called to verify and reported "that I wasn't taking care of him".  She reports "from there I was told that an agency would be stepping in to take over, but I never heard from an agency and I don't know if that was because of Covid or what".  She reports that she did not receive a check for October for Eddie patient.    CSW did follow up on patient's reports that he was going to stay with "Eddie Holland".  CSW was informed that Eddie Holland is Eddie sister to Eddie Holland.  She reports that it is unlikely that Eddie Holland gave permission for Eddie patient to stay with her. CSW asked about this house on family land that patient reports that he can go to.  CSW was informed that Eddie family does have land property and a home, however, Eddie Holland reports that Eddie home is not "habitable and he would not be welcome there if it was".  She reports that Eddie home is not in good repair, and "no one stays there but Eddie Holland when she comes to visit".   Assunta Curtis, MSW, LCSW 03/03/2019 2:01 PM

## 2019-03-03 NOTE — [facility] Counselor (Signed)
CSW attempted to reach [place] at [place], to discuss the patient's Medicaid. CSW was unable to speak with DSS and left HIPAA compliant voicemail requesting a return phone call.  Assunta Curtis, MSW, LCSW 03/03/2019 1:36 PM

## 2019-03-03 NOTE — Progress Notes (Signed)
Patient is camping in his bathroom were he placed his matrass, and sleep there , refused ADLs and not taking baths , encouraged patient to verbalize his feelings and use some positive coping skills , patient is ignoring any form of teaching, but patient is taking his medicines as ordered , affect is blunt with depressed mood , cognitively withdrawn and speech is pressured , expresses impulsivity , frequent safety checks is maintained , patient denies hurting himself or others , staff continues  monitoring .

## 2019-03-03 NOTE — Progress Notes (Signed)
[facility] MD Progress Note  03/03/2019 3:48 PM Eddie Holland  MRN:  [id number] Subjective: Patient seen and chart reviewed.  Patient has been compliant with his medicine for several days now and you can really tell.  He is much calmer and much more appropriate in conversation.  Has a normal back-and-forth conversation and asks appropriate questions.  Does not get hostile with people does not make inappropriate noises.  He had a couple of reasonable questions about his medicine that I answered.  Did not have any particular demands but did tell me that he thought he could not "make it" in a shelter in hopes that we can find a group home for him to go to.  Blood pressure is running fine.  Patient has consistently refused the EKG.  I explained to him the reason why it was required and the safety element involved but he continues to refuse it so I will go ahead and discontinue. Principal Problem: Schizophrenia (Eddie Holland) Diagnosis: Principal Problem:   Schizophrenia ([place])  Total Time spent with patient: 30 minutes  Past Psychiatric History: Patient has a past history of psychotic disorder probably going back decades  Past Medical History:  Past Medical History:  Diagnosis Date  . Asthma   . Back pain   . Paranoia [facility])     Past Surgical History:  Procedure Laterality Date  . CHOLECYSTECTOMY     Family History: History reviewed. No pertinent family history. Family Psychiatric  History: See previous Social History:  Social History   Substance and Sexual Activity  Alcohol Use No     Social History   Substance and Sexual Activity  Drug Use No    Social History   Socioeconomic History  . Marital status: Married    Spouse name: Not on file  . Number of children: Not on file  . Years of education: Not on file  . Highest education level: Not on file  Occupational History  . Not on file  Social Needs  . Financial resource strain: Patient refused  . Food insecurity    Worry: Patient refused     Inability: Patient refused  . Transportation needs    Medical: Patient refused    Non-medical: Patient refused  Tobacco Use  . Smoking status: Never Smoker  . Smokeless tobacco: Never Used  Substance and Sexual Activity  . Alcohol use: No  . Drug use: No  . Sexual activity: Not Currently  Lifestyle  . Physical activity    Days per week: Patient refused    Minutes per session: Patient refused  . Stress: Patient refused  Relationships  . Social Herbalist on phone: Patient refused    Gets together: Patient refused    Attends religious service: Patient refused    Active member of club or organization: Patient refused    Attends meetings of clubs or organizations: Patient refused    Relationship status: Patient refused  Other Topics Concern  . Not on file  Social History Narrative  . Not on file   Additional Social History:                         Sleep: Fair  Appetite:  Fair  Current Medications: Current Facility-Administered Medications  Medication Dose Route Frequency Provider Last Rate Last Dose  . acetaminophen (TYLENOL) tablet 650 mg  650 mg Oral Q6H PRN Courage Biglow, Madie Reno, MD      . alum & mag hydroxide-simeth (MAALOX/MYLANTA) 200-200-20  MG/5ML suspension 30 mL  30 mL Oral Q4H PRN Radwan Cowley, Jackquline Denmark, MD   30 mL at 02/20/19 2048  . divalproex (DEPAKOTE) DR tablet 500 mg  500 mg Oral Q12H Caidence Higashi, Jackquline Denmark, MD   500 mg at 03/03/19 0857  . fluticasone (FLONASE) 50 MCG/ACT nasal spray 2 spray  2 spray Each Nare Daily Jacobi Ryant T, MD   2 spray at 02/20/19 0900  . haloperidol decanoate (HALDOL DECANOATE) 100 MG/ML injection 200 mg  200 mg Intramuscular Q30 days Money, [place] B, FNP   200 mg at 02/17/19 1644  . hydrOXYzine (ATARAX/VISTARIL) tablet 50 mg  50 mg Oral TID PRN Money, Gerlene Burdock, FNP   50 mg at 02/19/19 2007  . magnesium hydroxide (MILK OF MAGNESIA) suspension 30 mL  30 mL Oral Daily PRN Mansour Balboa T, MD      . naproxen (NAPROSYN) tablet 500  mg  500 mg Oral BID WC Money, Feliz Beam B, FNP   500 mg at 03/03/19 0857  . OLANZapine (ZYPREXA) injection 15 mg  15 mg Intramuscular Q12H PRN Sherlene Rickel, Jackquline Denmark, MD   15 mg at 02/27/19 2136  . OLANZapine zydis (ZYPREXA) disintegrating tablet 10 mg  10 mg Oral TID Malvin Johns, MD   10 mg at 03/03/19 1222  . paliperidone (INVEGA SUSTENNA) injection 234 mg  234 mg Intramuscular Q28 days Money, Gerlene Burdock, FNP   234 mg at 02/27/19 1224  . traZODone (DESYREL) tablet 100 mg  100 mg Oral QHS PRN Antonieta Pert, MD   100 mg at 03/02/19 2103    Lab Results: No results found for this or any previous visit (from the past 48 hour(s)).  Blood Alcohol level:  Lab Results  Component Value Date   ETH <10 02/12/2019   ETH <10 02/10/2019    Metabolic Disorder Labs: No results found for: HGBA1C, MPG No results found for: PROLACTIN Lab Results  Component Value Date   CHOL 137 02/14/2019   TRIG 42 02/14/2019   HDL 56 02/14/2019   CHOLHDL 2.4 02/14/2019   VLDL 8 02/14/2019   LDLCALC 73 02/14/2019    Physical Findings: AIMS:  , ,  ,  ,    CIWA:    COWS:     Musculoskeletal: Strength & Muscle Tone: within normal limits Gait & Station: normal Patient leans: N/A  Psychiatric Specialty Exam: Physical Exam  Nursing note and vitals reviewed. Constitutional: He appears well-developed and well-nourished.  HENT:  Head: Normocephalic and atraumatic.  Eyes: Pupils are equal, round, and reactive to light. Conjunctivae are normal.  Neck: Normal range of motion.  Cardiovascular: Normal heart sounds.  Respiratory: Effort normal.  GI: Soft.  Musculoskeletal: Normal range of motion.  Neurological: He is alert.  Skin: Skin is warm and dry.  Psychiatric: His speech is normal and behavior is normal. Judgment and thought content normal. His affect is blunt. Cognition and memory are impaired.    Review of Systems  Constitutional: Negative.   HENT: Negative.   Eyes: Negative.   Respiratory: Negative.    Cardiovascular: Negative.   Gastrointestinal: Negative.   Musculoskeletal: Negative.   Skin: Negative.   Neurological: Negative.   Psychiatric/Behavioral: Negative.     Blood pressure 120/75, pulse 87, temperature 97.9 F (36.6 C), temperature source Oral, resp. rate 17, height 6' (1.829 m), weight 127 kg, SpO2 96 %.Body mass index is 37.97 kg/m.  General Appearance: Casual  Eye Contact:  Fair  Speech:  Clear and Coherent  Volume:  Normal  Mood:  Euthymic  Affect:  Constricted  Thought Process:  Coherent  Orientation:  Full (Time, Place, and Person)  Thought Content:  Illogical and Rumination  Suicidal Thoughts:  No  Homicidal Thoughts:  No  Memory:  Immediate;   Fair Recent;   Poor Remote;   Fair  Judgement:  Fair  Insight:  Shallow  Psychomotor Activity:  Normal  Concentration:  Concentration: Fair  Recall:  FiservFair  Fund of Knowledge:  Fair  Language:  Fair  Akathisia:  No  Handed:  Right  AIMS (if indicated):     Assets:  Desire for Improvement Physical Health Resilience  ADL's:  Impaired  Cognition:  Impaired,  Mild  Sleep:  Number of Hours: 6     Treatment Plan Summary: Daily contact with patient to assess and evaluate symptoms and progress in treatment, Medication management and Plan Pleasant gentleman who is much better now that he is on his medicine.  I see that we have still not confirmed his Medicaid or any of his outside benefits making it hard to know how much progress we might be making to finding a place for him to live.  We will continue to review this with the full treatment team as medically he is getting much better.  Mordecai RasmussenJohn Jeffree Cazeau, MD 03/03/2019, 3:48 PM

## 2019-03-03 NOTE — Plan of Care (Signed)
  Problem: Education: Goal: Knowledge of [place] [facility] Education information/materials will improve 03/03/2019 0454 by Libby Maw, RN Outcome: Progressing 03/03/2019 0435 by Libby Maw, RN Outcome: Progressing Goal: Emotional status will improve 03/03/2019 0454 by Libby Maw, RN Outcome: Progressing 03/03/2019 0435 by Libby Maw, RN Outcome: Progressing Goal: Mental status will improve 03/03/2019 0454 by Libby Maw, RN Outcome: Progressing 03/03/2019 0435 by Libby Maw, RN Outcome: Progressing Goal: Verbalization of understanding the information provided will improve 03/03/2019 0454 by Libby Maw, RN Outcome: Progressing 03/03/2019 0435 by Libby Maw, RN Outcome: Progressing  D: Patient has been rude to staff. Refused to take his medication from this Pryor Curia but took it from the other nurse. Asking for snack and 3am. Denies SI, HI and AVH. A: Continue to monitor for safety R: Safety maintained.

## 2019-03-03 NOTE — Plan of Care (Signed)
  Problem: Education: Goal: Knowledge of [facility] Education information/materials will improve Outcome: Progressing Goal: Emotional status will improve Outcome: Progressing Goal: Mental status will improve Outcome: Progressing Goal: Verbalization of understanding the information provided will improve Outcome: Progressing   Problem: Activity: Goal: Interest or engagement in activities will improve Outcome: Progressing Goal: Sleeping patterns will improve Outcome: Progressing   Problem: Coping: Goal: Ability to verbalize frustrations and anger appropriately will improve Outcome: Progressing Goal: Ability to demonstrate self-control will improve Outcome: Progressing   Problem: Health Behavior/Discharge Planning: Goal: Identification of resources available to assist in meeting health care needs will improve Outcome: Progressing Goal: Compliance with treatment plan for underlying cause of condition will improve Outcome: Progressing   Problem: Physical Regulation: Goal: Ability to maintain clinical measurements within normal limits will improve Outcome: Progressing   Problem: Safety: Goal: Periods of time without injury will increase Outcome: Progressing   

## 2019-03-03 NOTE — Progress Notes (Signed)
Recreation Therapy Notes  Date: 03/03/2019  Time: 9:30 am  Location: Craft room  Behavioral response: Appropriate   Intervention Topic: Anger Management   Discussion/Intervention:  Group content on today was focused on anger management. The group defined anger and reasons they become angry. Individuals expressed negative way they have dealt with anger in the past. Patients stated some positive ways they could deal with anger in the future. The group described how anger can affect your health and daily plans. Individuals participated in the intervention "Score your anger" where they had a chance to answer questions about themselves and get a score of their anger.  Clinical Observations/Feedback:  Patient came to group and stated that to manage anger you must think before you act. He stated when he is angry his eyes and speech gets wicked. Participant left group early due to unknown reasons.    Elmer Merwin LRT/CTRS         Naelle Diegel 03/03/2019 11:48 AM

## 2019-03-03 NOTE — B[facility] Group Notes (Signed)
Overcoming Obstacles  03/03/2019 1PM  Type of Therapy and Topic:  Group Therapy:  Overcoming Obstacles  Participation Level:  Active    Description of Group:    In this group patients will be encouraged to explore what they see as obstacles to their own wellness and recovery. They will be guided to discuss their thoughts, feelings, and behaviors related to these obstacles. The group will process together ways to cope with barriers, with attention given to specific choices patients can make. Each patient will be challenged to identify changes they are motivated to make in order to overcome their obstacles. This group will be process-oriented, with patients participating in exploration of their own experiences as well as giving and receiving support and challenge from other group members.   Therapeutic Goals: 1. Patient will identify personal and current obstacles as they relate to admission. 2. Patient will identify barriers that currently interfere with their wellness or overcoming obstacles.  3. Patient will identify feelings, thought process and behaviors related to these barriers. 4. Patient will identify two changes they are willing to make to overcome these obstacles:      Summary of Patient Progress Actively and appropriately participated in todays session. Pt identified his physical health as an obstacle he would like to overcome. Pt discussed having health challenges that have made it difficult for him to participate in physical activity. Pt receptive to feedback from group members.    Therapeutic Modalities:   Cognitive Behavioral Therapy Solution Focused Therapy Motivational Interviewing Relapse Prevention Therapy    Sanjuana Kava, MSW, LCSW 03/03/2019 2:29 PM

## 2019-03-03 NOTE — BHH Counselor (Signed)
CSW received a return call from [place] at [facility].  CSW inquired about transferring the patients Medicaid to [facility].  CSW was informed that Medicaid was transferred to [facility] on 02/18/2019.  CSW was informed that Lajoyce Corners was not given the name of the new caseworker but told to contact [phone] for any concerns.  This line is for a Librarian, academic at [facility] [employer].  She reports that the patient was not receiving full Medicaid but "assistance with Medicare Part B".  She reports that he was not receiving full Medicaid because his income (disability) was too high.    Assunta Curtis, MSW, LCSW 03/03/2019 1:49 PM

## 2019-03-03 NOTE — Progress Notes (Signed)
D: Patient has been rude to staff. Refused to take his medication from this Eddie Holland but took it from the other nurse. Asking for snack and 3am. Denies SI, HI and AVH. A: Continue to monitor for safety R: Safety maintained.

## 2019-03-03 NOTE — Progress Notes (Signed)
Patient refused his EKG stating "I don't need it". This Probation officer will notify MD.

## 2019-03-03 NOTE — Plan of Care (Signed)
D- Patient alert and oriented. Patient presented in a pleasant mood on assessment reporting that he slept "fair" last night and had no complaints or concerns to voice to this Probation officer. Patient denied any signs/symptoms of depression/anxiety. Patient also denied SI, HI, AVH, and pain at this time. Patient's goal for today is "love for others". Patient has shown improvement over the past few days by taking his prescribed medication as scheduled. Patient will state that he doesn't need them and then staff will explain why he's taking it and he will comply without any issues.  A- Scheduled medications administered to patient, per MD orders. Support and encouragement provided.  Routine safety checks conducted every 15 minutes.  Patient informed to notify staff with problems or concerns.  R- No adverse drug reactions noted. Patient contracts for safety at this time. Patient compliant with medications and treatment plan. Patient receptive, calm, and cooperative. Patient interacts well with others on the unit.  Patient remains safe at this time.  Problem: Education: Goal: Knowledge of [place] [facility] Education information/materials will improve Outcome: Progressing Goal: Emotional status will improve Outcome: Progressing Goal: Mental status will improve Outcome: Progressing Goal: Verbalization of understanding the information provided will improve Outcome: Progressing   Problem: Activity: Goal: Interest or engagement in activities will improve Outcome: Progressing Goal: Sleeping patterns will improve Outcome: Progressing   Problem: Coping: Goal: Ability to verbalize frustrations and anger appropriately will improve Outcome: Progressing Goal: Ability to demonstrate self-control will improve Outcome: Progressing   Problem: Health Behavior/Discharge Planning: Goal: Identification of resources available to assist in meeting health care needs will improve Outcome: Progressing Goal: Compliance  with treatment plan for underlying cause of condition will improve Outcome: Progressing   Problem: Physical Regulation: Goal: Ability to maintain clinical measurements within normal limits will improve Outcome: Progressing   Problem: Safety: Goal: Periods of time without injury will increase Outcome: Progressing

## 2019-03-04 MED ORDER — GABAPENTIN 400 MG PO CAPS
400.0000 mg | ORAL_CAPSULE | Freq: Three times a day (TID) | ORAL | Status: DC
  Filled 2019-03-04 (×4): qty 1

## 2019-03-04 NOTE — Progress Notes (Signed)
[facility] MD Progress Note  03/04/2019 9:29 AM Eddie Holland  MRN:  [id number] Subjective:    Patient remains somewhat intrusive pressured and rambling in speech he does not have current thoughts of harming himself though he does however list various unrealistic plans for discharge, going to various counties he can list, so forth has no specific or meaningful plans at this point in time. Remains hypomanic displays akathisia but denies hallucinations.   Principal Problem: Schizophrenia ([place]) Diagnosis: Principal Problem:   Schizophrenia ([place])  Total Time spent with patient: 20 minutes  Past Psychiatric History: Extensive  Past Medical History:  Past Medical History:  Diagnosis Date  . Asthma   . Back pain   . Paranoia [facility])     Past Surgical History:  Procedure Laterality Date  . CHOLECYSTECTOMY     Family History: History reviewed. No pertinent family history. Family Psychiatric  History: See eval Social History:  Social History   Substance and Sexual Activity  Alcohol Use No     Social History   Substance and Sexual Activity  Drug Use No    Social History   Socioeconomic History  . Marital status: Married    Spouse name: Not on file  . Number of children: Not on file  . Years of education: Not on file  . Highest education level: Not on file  Occupational History  . Not on file  Social Needs  . Financial resource strain: Patient refused  . Food insecurity    Worry: Patient refused    Inability: Patient refused  . Transportation needs    Medical: Patient refused    Non-medical: Patient refused  Tobacco Use  . Smoking status: Never Smoker  . Smokeless tobacco: Never Used  Substance and Sexual Activity  . Alcohol use: No  . Drug use: No  . Sexual activity: Not Currently  Lifestyle  . Physical activity    Days per week: Patient refused    Minutes per session: Patient refused  . Stress: Patient refused  Relationships  . Social Herbalist on  phone: Patient refused    Gets together: Patient refused    Attends religious service: Patient refused    Active member of club or organization: Patient refused    Attends meetings of clubs or organizations: Patient refused    Relationship status: Patient refused  Other Topics Concern  . Not on file  Social History Narrative  . Not on file   Additional Social History:                         Sleep: Fair  Appetite:  Fair  Current Medications: Current Facility-Administered Medications  Medication Dose Route Frequency Provider Last Rate Last Dose  . acetaminophen (TYLENOL) tablet 650 mg  650 mg Oral Q6H PRN Clapacs, John T, MD      . alum & mag hydroxide-simeth (MAALOX/MYLANTA) 200-200-20 MG/5ML suspension 30 mL  30 mL Oral Q4H PRN Clapacs, Madie Reno, MD   30 mL at 02/20/19 2048  . divalproex (DEPAKOTE) DR tablet 500 mg  500 mg Oral Q12H Clapacs, Madie Reno, MD   500 mg at 03/04/19 0932  . fluticasone (FLONASE) 50 MCG/ACT nasal spray 2 spray  2 spray Each Nare Daily Clapacs, John T, MD   2 spray at 03/04/19 0811  . haloperidol decanoate (HALDOL DECANOATE) 100 MG/ML injection 200 mg  200 mg Intramuscular Q30 days Money, Darnelle Maffucci B, FNP   200 mg at  02/17/19 1644  . hydrOXYzine (ATARAX/VISTARIL) tablet 50 mg  50 mg Oral TID PRN Money, Gerlene Burdock, FNP   50 mg at 02/19/19 2007  . magnesium hydroxide (MILK OF MAGNESIA) suspension 30 mL  30 mL Oral Daily PRN Clapacs, John T, MD      . naproxen (NAPROSYN) tablet 500 mg  500 mg Oral BID WC Money, Feliz Beam B, FNP   500 mg at 03/04/19 0811  . OLANZapine (ZYPREXA) injection 15 mg  15 mg Intramuscular Q12H PRN Clapacs, Jackquline Denmark, MD   15 mg at 02/27/19 2136  . OLANZapine zydis (ZYPREXA) disintegrating tablet 10 mg  10 mg Oral TID Malvin Johns, MD   10 mg at 03/04/19 0811  . paliperidone (INVEGA SUSTENNA) injection 234 mg  234 mg Intramuscular Q28 days Money, Gerlene Burdock, FNP   234 mg at 02/27/19 1224  . traZODone (DESYREL) tablet 100 mg  100 mg Oral QHS PRN  Antonieta Pert, MD   100 mg at 03/03/19 2001    Lab Results: No results found for this or any previous visit (from the past 48 hour(s)).  Blood Alcohol level:  Lab Results  Component Value Date   ETH <10 02/12/2019   ETH <10 02/10/2019    Metabolic Disorder Labs: No results found for: HGBA1C, MPG No results found for: PROLACTIN Lab Results  Component Value Date   CHOL 137 02/14/2019   TRIG 42 02/14/2019   HDL 56 02/14/2019   CHOLHDL 2.4 02/14/2019   VLDL 8 02/14/2019   LDLCALC 73 02/14/2019    Physical Findings: AIMS:  , ,  ,  ,    CIWA:    COWS:     Musculoskeletal: Strength & Muscle Tone: within normal limits Gait & Station: normal Patient leans: N/A  Psychiatric Specialty Exam: Physical Exam  ROS  Blood pressure 120/75, pulse 87, temperature 97.9 F (36.6 C), temperature source Oral, resp. rate 17, height 6' (1.829 m), weight 127 kg, SpO2 96 %.Body mass index is 37.97 kg/m.  General Appearance: Casual  Eye Contact:  Fair  Speech: Pressured  Volume:  Increased  Mood: Generally hypomanic but can be redirected and contained  Affect:  Congruent  Thought Process:  Irrelevant and Descriptions of Associations: Loose  Orientation:  Full (Time, Place, and Person)/not exact date  Thought Content:  Illogical and Probably racing  Suicidal Thoughts:  No  Homicidal Thoughts:  No  Memory:  2 of 3 and 0 of 3 but not attentive to task  Judgement:  Poor  Insight:  Shallow  Psychomotor Activity: Akathisia noted  Concentration:  Concentration: Poor and Attention Span: Poor  Recall:  Poor  Fund of Knowledge:  Fair  Language:  Generally intact  Akathisia:  Yes  Handed:  Right  AIMS (if indicated):     Assets:  Leisure Time Resilience  ADL's:  Intact  Cognition:  WNL  Sleep:  Number of Hours: 8     Treatment Plan Summary: Daily contact with patient to assess and evaluate symptoms and progress in treatment and Medication management Continue combination  antipsychotic therapy continue mood stabilizer therapy with Depakote continue reality based therapy  No change in precautions Add gabapentin for help with akathisia off label Danyella Mcginty, MD 03/04/2019, 9:29 AM

## 2019-03-04 NOTE — Progress Notes (Signed)
Recreation Therapy Notes   Date: 03/04/2019  Time: 9:30 am  Location: Craft room  Behavioral response: Appropriate   Intervention Topic: Relaxation   Discussion/Intervention:  Group content today was focused on relaxation. The group defined relaxation and identified healthy ways to relax. Individuals expressed how much time they spend relaxing. Patients expressed how much their life would be if they did not make time for themselves to relax. The group stated ways they could improve their relaxation techniques in the future.  Individuals participated in the intervention "Time to Relax" where they had a chance to experience different relaxation techniques.  Clinical Observations/Feedback:  Patient came to group and stated that relaxation is being patient and letting the person hit you first so you look good in court for defending yourself. He express he likes to exercise, watch sports and take rides in the country to relax. Individual was social with peers and staff while participating in the intervention.    Syreeta Figler LRT/CTRS         Mischa Pollard 03/04/2019 12:06 PM

## 2019-03-04 NOTE — Progress Notes (Signed)
Patient was irritable when staff encouraged him for a shower.Patient got loud slammed the door.Patient put towel in the toilet and his mattress is on the bathroom floor.Patient is not receptive and does not want to listen to staff.

## 2019-03-04 NOTE — [facility] Group Notes (Signed)
  LCSW Group Therapy Note  03/04/2019 2:11 PM   Type of Therapy/Topic:  Group Therapy:  Feelings about Diagnosis  Participation Level:  Active   Description of Group:   This group will allow patients to explore their thoughts and feelings about diagnoses they have received. Patients will be guided to explore their level of understanding and acceptance of these diagnoses. Facilitator will encourage patients to process their thoughts and feelings about the reactions of others to their diagnosis and will guide patients in identifying ways to discuss their diagnosis with significant others in their lives. This group will be process-oriented, with patients participating in exploration of their own experiences, giving and receiving support, and processing challenge from other group members.   Therapeutic Goals: 1. Patient will demonstrate understanding of diagnosis as evidenced by identifying two or more symptoms of the disorder 2. Patient will be able to express two feelings regarding the diagnosis 3. Patient will demonstrate their ability to communicate their needs through discussion and/or role play  Summary of Patient Progress: Pt was present in group and participated in the group discussion about diagnoses. Pt reported that he was diagnosed with schizophrenia in the past, but then they changed his diagnosis to bipolar disorder. Pt discussed having to take medication the rest of his life as their is no cure but you can only maintain the symptoms.   Therapeutic Modalities:   Cognitive Behavioral Therapy Brief Therapy Feelings Identification    Evalina Field, MSW, LCSW Clinical Social Work 03/04/2019 2:11 PM

## 2019-03-04 NOTE — Plan of Care (Signed)
Patient refused PO medications and took injection.

## 2019-03-05 MED ORDER — OLANZAPINE 10 MG PO TBDP: 10.0000 mg | ORAL_TABLET | Freq: Three times a day (TID) | ORAL | 0 refills | Status: DC

## 2019-03-05 MED ORDER — DIVALPROEX SODIUM 500 MG PO DR TAB: 500.0000 mg | DELAYED_RELEASE_TABLET | Freq: Two times a day (BID) | ORAL | 0 refills | Status: DC

## 2019-03-05 MED ORDER — TRAZODONE HCL 100 MG PO TABS: 100.0000 mg | ORAL_TABLET | Freq: Every evening | ORAL | 0 refills | Status: DC | PRN

## 2019-03-05 MED ORDER — GABAPENTIN 400 MG PO CAPS: 400.0000 mg | ORAL_CAPSULE | Freq: Three times a day (TID) | ORAL | 0 refills | Status: DC

## 2019-03-05 MED ORDER — HALOPERIDOL DECANOATE 100 MG/ML IM SOLN: 200.0000 mg | INTRAMUSCULAR | 0 refills | Status: DC

## 2019-03-05 NOTE — Progress Notes (Addendum)
Pt refuses vital signs. Pt is sleeping on the floor on the mattress. Pt is putting paper towels in toilet to keep "something is coming up on my butt... they are putting something up my rectum". Collier Bullock RN

## 2019-03-05 NOTE — B[facility] Counselor (Signed)
CSW spoke with the patient about [facility] being an alternative placement if his plans to go to a group home or boarding house was not successful. CSW reviewed the need for an ID, religious service requirement and 40 hour work week at the shelter.  Patient agreed and said this sounded like a good idea.  Assunta Curtis, MSW, LCSW 03/05/2019 2:58 PM

## 2019-03-05 NOTE — Plan of Care (Signed)
Pt denies depression, anxiety, SI, HI and AVH. Pt was educated on care plan and verbalizes understanding. Collier Bullock RN Problem: Education: Goal: Knowledge of [facility] Education information/materials will improve Outcome: Progressing Goal: Emotional status will improve Outcome: Progressing Goal: Mental status will improve Outcome: Not Progressing Goal: Verbalization of understanding the information provided will improve Outcome: Progressing   Problem: Activity: Goal: Interest or engagement in activities will improve Outcome: Progressing Goal: Sleeping patterns will improve Outcome: Not Progressing   Problem: Coping: Goal: Ability to verbalize frustrations and anger appropriately will improve Outcome: Progressing Goal: Ability to demonstrate self-control will improve Outcome: Progressing   Problem: Health Behavior/Discharge Planning: Goal: Identification of resources available to assist in meeting health care needs will improve Outcome: Progressing Goal: Compliance with treatment plan for underlying cause of condition will improve Outcome: Progressing   Problem: Physical Regulation: Goal: Ability to maintain clinical measurements within normal limits will improve Outcome: Progressing   Problem: Safety: Goal: Periods of time without injury will increase Outcome: Progressing

## 2019-03-05 NOTE — Progress Notes (Signed)
Patient ia alert and oriented x 3 with periods of confusion to situation, thoughts are dorganized and incooherent, he denies SI/HI/AVH but appears responding to internal stimuli, he was unwilling ro participate in treatment plan, evasive, after much persuasion he came to medication room and he was compliant with nighttime prescribed medication. Patient a[[eaars dishelveled with some foul body odor noted, he was encouraged to take a bath but he refused. Patient is not interacting appropriately with peers and staff, in the dayroom, 15 minutes safety checks maintained will continue to monitor

## 2019-03-05 NOTE — Progress Notes (Signed)
CSW called [facility] to inquire about pts medicaid status. They reported pt has Medicare Part B. Per DSS, pts medicaid ended February 28, 2018 and pt currently does not have any medicaid. The process could take up to 45 days if pt has to do a new application, but once he is assigned a worker it may be a quicker process if they can do the "buy-in" process. Pts case is not fully transferred to [facility] yet which is why he does not have an assigned worker currently.    Evalina Field, MSW, LCSW Clinical Social Work 03/05/2019 10:32 AM

## 2019-03-05 NOTE — Progress Notes (Signed)
[facility]  03/05/2019 9:47 AM Eddie Holland  MRN:  [id number]   Subjective: This is a follow-up for the patient diagnosed with schizophrenia.  Patient reports today that he is doing fine.  Patient reports that he has been sleeping and felt that he may have missed breakfast this morning.  He continues to state that he has been sleeping well and his appetite is been good.  Patient denies any suicidal or homicidal ideations and denies any hallucinations.  When asked about why he put paper towels in the toilet he states that someone must a lot on him because he did not do that.  He reports that he has a couple places to go live out in [place] when he is discharged and he feels that he is ready to leave as soon as we will let him.  Principal Problem: Schizophrenia (HCC) Diagnosis: Principal Problem:   Schizophrenia (HCC)  Total Time spent with patient: 20 minutes  Past Psychiatric History: Not sure how much of this is reliable but scattered between the other parts of his history I gather that he has been diagnosed with schizophrenia or schizoaffective disorder for a long time.  He has had multiple hospitalizations.  He had previously lived in [place] but at some point move to [place] apparently after his wife passed away.  He tells me that his mother is still living and that she lives in [place] and is his legal guardian.  He has a lot of paranoid and disorganized animosity towards her.  He denies ever having tried to kill himself.  He rattles off multiple names of antipsychotics and mood stabilizers all of which she says have done him wrong and that he does not like.  The only medicine he likes is either Haldol or Ativan.  Past Medical History:  Past Medical History:  Diagnosis Date  . Asthma   . Back pain   . Paranoia [facility](HCC)     Past Surgical History:  Procedure Laterality Date  . CHOLECYSTECTOMY     Family History: History reviewed. No pertinent family  history. Family Psychiatric  History: Denies Social History:  Social History   Substance and Sexual Activity  Alcohol Use No     Social History   Substance and Sexual Activity  Drug Use No    Social History   Socioeconomic History  . Marital status: Married    Spouse name: Not on file  . Number of children: Not on file  . Years of education: Not on file  . Highest education level: Not on file  Occupational History  . Not on file  Social Needs  . Financial resource strain: Patient refused  . Food insecurity    Worry: Patient refused    Inability: Patient refused  . Transportation needs    Medical: Patient refused    Non-medical: Patient refused  Tobacco Use  . Smoking status: Never Smoker  . Smokeless tobacco: Never Used  Substance and Sexual Activity  . Alcohol use: No  . Drug use: No  . Sexual activity: Not Currently  Lifestyle  . Physical activity    Days per week: Patient refused    Minutes per session: Patient refused  . Stress: Patient refused  Relationships  . Social Musicianconnections    Talks on phone: Patient refused    Gets together: Patient refused    Attends religious service: Patient refused    Active member of club or organization: Patient refused  Attends meetings of clubs or organizations: Patient refused    Relationship status: Patient refused  Other Topics Concern  . Not on file  Social History Narrative  . Not on file   Additional Social History:                         Sleep: Good  Appetite:  Good  Current Medications: Current Facility-Administered Medications  Medication Dose Route Frequency Provider Last Rate Last Dose  . acetaminophen (TYLENOL) tablet 650 mg  650 mg Oral Q6H PRN Clapacs, John T, MD      . alum & mag hydroxide-simeth (MAALOX/MYLANTA) 200-200-20 MG/5ML suspension 30 mL  30 mL Oral Q4H PRN Clapacs, Madie Reno, MD   30 mL at 02/20/19 2048  . divalproex (DEPAKOTE) DR tablet 500 mg  500 mg Oral Q12H Clapacs, Madie Reno, MD   500 mg at 03/05/19 2094  . fluticasone (FLONASE) 50 MCG/ACT nasal spray 2 spray  2 spray Each Nare Daily Clapacs, Madie Reno, MD   2 spray at 03/04/19 [phone]  . gabapentin (NEURONTIN) capsule 400 mg  400 mg Oral TID Johnn Hai, MD      . haloperidol decanoate (HALDOL DECANOATE) 100 MG/ML injection 200 mg  200 mg Intramuscular Q30 days Providencia Hottenstein, Lowry Ram, FNP   200 mg at 02/17/19 1644  . hydrOXYzine (ATARAX/VISTARIL) tablet 50 mg  50 mg Oral TID PRN Dominiq Fontaine, Lowry Ram, FNP   50 mg at 03/04/19 2205  . magnesium hydroxide (MILK OF MAGNESIA) suspension 30 mL  30 mL Oral Daily PRN Clapacs, John T, MD      . naproxen (NAPROSYN) tablet 500 mg  500 mg Oral BID WC Alejandra Hunt, Darnelle Maffucci B, FNP   500 mg at 03/05/19 2836  . OLANZapine (ZYPREXA) injection 15 mg  15 mg Intramuscular Q12H PRN Clapacs, Madie Reno, MD   15 mg at 03/04/19 1627  . OLANZapine zydis (ZYPREXA) disintegrating tablet 10 mg  10 mg Oral TID Johnn Hai, MD   10 mg at 03/05/19 6294  . paliperidone (INVEGA SUSTENNA) injection 234 mg  234 mg Intramuscular Q28 days Meela Wareing, Lowry Ram, FNP   234 mg at 02/27/19 1224  . traZODone (DESYREL) tablet 100 mg  100 mg Oral QHS PRN Sharma Covert, MD   100 mg at 03/04/19 2205    Lab Results: No results found for this or any previous visit (from the past 75 hour(s)).  Blood Alcohol level:  Lab Results  Component Value Date   ETH <10 02/12/2019   ETH <10 76/54/6503    Metabolic Disorder Labs: No results found for: HGBA1C, MPG No results found for: PROLACTIN Lab Results  Component Value Date   CHOL 137 02/14/2019   TRIG 42 02/14/2019   HDL 56 02/14/2019   CHOLHDL 2.4 02/14/2019   VLDL 8 02/14/2019   LDLCALC 73 02/14/2019    Physical Findings: AIMS:  , ,  ,  ,    CIWA:    COWS:     Musculoskeletal: Strength & Muscle Tone: within normal limits Gait & Station: normal Patient leans: N/A  Psychiatric Specialty Exam: Physical Exam  Nursing note and vitals reviewed. Constitutional: He is oriented  to person, place, and time. He appears well-developed and well-nourished.  Cardiovascular: Normal rate.  Respiratory: Effort normal.  Musculoskeletal: Normal range of motion.  Neurological: He is alert and oriented to person, place, and time.  Skin: Skin is warm.    Review of Systems  Constitutional: Negative.   HENT: Negative.   Eyes: Negative.   Respiratory: Negative.   Cardiovascular: Negative.   Gastrointestinal: Negative.   Genitourinary: Negative.   Musculoskeletal: Negative.   Skin: Negative.   Neurological: Negative.   Endo/Heme/Allergies: Negative.   Psychiatric/Behavioral: Negative.     Blood pressure 120/75, pulse 87, temperature 97.9 F (36.6 C), temperature source Oral, resp. rate 17, height 6' (1.829 m), weight 127 kg, SpO2 96 %.Body mass index is 37.97 kg/m.  General Appearance: Casual  Eye Contact:  Minimal  Speech:  Clear and Coherent and Normal Rate  Volume:  Normal  Mood:  Euthymic  Affect:  Congruent  Thought Process:  Coherent and Descriptions of Associations: Intact  Orientation:  Full (Time, Place, and Person)  Thought Content:  WDL  Suicidal Thoughts:  No  Homicidal Thoughts:  No  Memory:  Immediate;   Good Recent;   Good Remote;   Good  Judgement:  Fair  Insight:  Fair  Psychomotor Activity:  Normal  Concentration:  Concentration: Fair  Recall:  Good  Fund of Knowledge:  Fair  Language:  Good  Akathisia:  No  Handed:  Right  AIMS (if indicated):     Assets:  Communication Skills Desire for Improvement Financial Resources/Insurance Housing Resilience  ADL's:  Intact  Cognition:  WNL  Sleep:  Number of Hours: 6.75   Assessment: Patient presents in his bathroom lying on his mattress which he still has in the floor.  It was reported this morning that the patient had told the RN that he had placed paper towels in the toilet because he was afraid something would come to the toilet and go on his butt.  However now he is denying this.  Patient  has been doing much better and has been completely compliant with his medications thus far.  Patient seems to be stabilizing and doing much better than he was before.  There is been no reports of patient being irritable or aggressive towards anyone.  Patient has been attending groups and has been seen active in the milieu with no complaints thus far.  Plan is for possible discharge for this patient tomorrow morning.  Treatment Plan Summary: Daily contact with patient to assess and evaluate symptoms and progress in treatment and Medication management Continue Depakote 500 mg p.o. every 12 hours for mood stability Continue gabapentin 400 mg p.o. 3 times daily for pain Continue Haldol Decanoate 200 mg IM every 30 days with next dose due on 03/19/2019 Continue Vistaril 50 mg p.o. 3 times daily as needed for anxiety Continue Zyprexa 10 mg p.o. 3 times daily for schizophrenia Continue Invega Sustenna 234 mg IM every 28 days with next dose due on or about 03/28/2019 Continue trazodone 100 mg p.o. nightly as needed for insomnia Encourage group therapy participation Continue every 15 minute safety checks Potential discharge for tomorrow 03/06/2019 CSW to assist with disposition planning  Maryfrances Bunnell, FNP 03/05/2019, 9:47 AM

## 2019-03-05 NOTE — [facility] Group Notes (Signed)
[facility] Group Notes:  (Nursing/MHT/Case Management/Adjunct)  Date:  03/05/2019  Time:  8:58 PM  Type of Therapy:  Group Therapy  Participation Level:  Did Not Attend   Nehemiah Settle 03/05/2019, 8:58 PM

## 2019-03-05 NOTE — [facility] Group Notes (Signed)
LCSW Group Therapy Note  03/05/2019 1:00 PM  Type of Therapy/Topic:  Group Therapy:  Emotion Regulation  Participation Level:  Did Not Attend   Description of Group:   The purpose of this group is to assist patients in learning to regulate negative emotions and experience positive emotions. Patients will be guided to discuss ways in which they have been vulnerable to their negative emotions. These vulnerabilities will be juxtaposed with experiences of positive emotions or situations, and patients will be challenged to use positive emotions to combat negative ones. Special emphasis will be placed on coping with negative emotions in conflict situations, and patients will process healthy conflict resolution skills.  Therapeutic Goals: 1. Patient will identify two positive emotions or experiences to reflect on in order to balance out negative emotions 2. Patient will label two or more emotions that they find the most difficult to experience 3. Patient will demonstrate positive conflict resolution skills through discussion and/or role plays  Summary of Patient Progress:  X     Therapeutic Modalities:   Cognitive Behavioral Therapy Feelings Identification Dialectical Behavioral Therapy  Assunta Curtis, MSW, LCSW 03/05/2019 2:55 PM

## 2019-03-05 NOTE — Progress Notes (Signed)
CSW team spoke with pt regarding d/c plan tomorrow. Pt reported he wants to go to [place] and reported he could go to [address] or [address] both in [place], [place]. Pt reported that one is a group home and the other is a boarding home that he has been in before. Pt stated that he knows a lot of people in [place] and will be fine once he gets there. When asked how he would pay for these options, pt reported he has a payee, Ms Durenda Guthrie who has his money and that we could get the number from Ms Manuela Schwartz at [employer]. Pt provided the following numbers [phone] [[facility] Burton] and [phone] [facility]] and reported he wanted CSW team to reach out to them to see if he could stay at the boarding home or group home they have. CSW team discussed alternative options in case those homes did not work out due to his finances, Pt was agreeable to go to [facility] in the event he cannot go to those places in [place].   Darren Lehman Prom LCSW contacted both [facility] and [facility] and left voicemail requesting a call back regarding potential placement and beds for pt.   CSW spoke with Manuela Schwartz from [facility] who reported that Ms Durenda Guthrie is her aunt and she is not pts payee, but is the person who would've received the payments for his placement, but they did not receive any payment from pt. Manuela Schwartz reported being unaware whether or not pt has a payee.   Evalina Field, MSW, LCSW Clinical Social Work 03/05/2019 2:59 PM

## 2019-03-05 NOTE — Progress Notes (Signed)
Recreation Therapy Notes    Date: 03/05/2019  Time: 9:30 am   Location: Craft room   Behavioral response: N/A   Intervention Topic: Stress  Discussion/Intervention: Patient did not attend group.   Clinical Observations/Feedback:  Patient did not attend group.   Gonzalo Waymire LRT/CTRS        Huntley Demedeiros 03/05/2019 11:51 AM

## 2019-03-06 MED ORDER — HALOPERIDOL DECANOATE 100 MG/ML IM SOLN: 200.0000 mg | INTRAMUSCULAR | 1 refills | Status: AC

## 2019-03-06 MED ORDER — PALIPERIDONE PALMITATE ER 234 MG/1.5ML IM SUSY: 234.0000 mg | PREFILLED_SYRINGE | INTRAMUSCULAR | 1 refills | Status: AC

## 2019-03-06 MED ORDER — DIVALPROEX SODIUM 500 MG PO DR TAB: 500.0000 mg | DELAYED_RELEASE_TABLET | Freq: Two times a day (BID) | ORAL | 1 refills | Status: AC

## 2019-03-06 MED ORDER — OLANZAPINE 10 MG PO TBDP: 10.0000 mg | ORAL_TABLET | Freq: Three times a day (TID) | ORAL | 1 refills | Status: AC

## 2019-03-06 MED ORDER — GABAPENTIN 400 MG PO CAPS: 400.0000 mg | ORAL_CAPSULE | Freq: Three times a day (TID) | ORAL | 1 refills | Status: AC

## 2019-03-06 MED ORDER — FLUTICASONE PROPIONATE 50 MCG/ACT NA SUSP: 2.0000 | Freq: Every day | NASAL | 1 refills | Status: AC

## 2019-03-06 MED ORDER — TRAZODONE HCL 100 MG PO TABS: 100.0000 mg | ORAL_TABLET | Freq: Every evening | ORAL | 1 refills | Status: AC | PRN

## 2019-03-06 NOTE — Tx Team (Signed)
Interdisciplinary Treatment and Diagnostic Plan Update  03/06/2019 Time of Session: 830am Eddie Holland MRN: [id number]  Principal Diagnosis: Schizophrenia [facility])  Secondary Diagnoses: Principal Problem:   Schizophrenia (HCC)   Current Medications:  Current Facility-Administered Medications  Medication Dose Route Frequency Provider Last Rate Last Dose  . acetaminophen (TYLENOL) tablet 650 mg  650 mg Oral Q6H PRN Clapacs, John T, MD      . alum & mag hydroxide-simeth (MAALOX/MYLANTA) 200-200-20 MG/5ML suspension 30 mL  30 mL Oral Q4H PRN Clapacs, Jackquline Denmark, MD   30 mL at 02/20/19 2048  . divalproex (DEPAKOTE) DR tablet 500 mg  500 mg Oral Q12H Clapacs, Jackquline Denmark, MD   500 mg at 03/06/19 3785  . fluticasone (FLONASE) 50 MCG/ACT nasal spray 2 spray  2 spray Each Nare Daily Clapacs, Jackquline Denmark, MD   2 spray at 03/04/19 [id number]  . gabapentin (NEURONTIN) capsule 400 mg  400 mg Oral TID Malvin Johns, MD      . haloperidol decanoate (HALDOL DECANOATE) 100 MG/ML injection 200 mg  200 mg Intramuscular Q30 days Money, Gerlene Burdock, FNP   200 mg at 02/17/19 1644  . hydrOXYzine (ATARAX/VISTARIL) tablet 50 mg  50 mg Oral TID PRN Money, Gerlene Burdock, FNP   50 mg at 03/04/19 2205  . magnesium hydroxide (MILK OF MAGNESIA) suspension 30 mL  30 mL Oral Daily PRN Clapacs, John T, MD      . naproxen (NAPROSYN) tablet 500 mg  500 mg Oral BID WC Money, Feliz Beam B, FNP   500 mg at 03/06/19 2774  . OLANZapine (ZYPREXA) injection 15 mg  15 mg Intramuscular Q12H PRN Clapacs, Jackquline Denmark, MD   15 mg at 03/04/19 1627  . OLANZapine zydis (ZYPREXA) disintegrating tablet 10 mg  10 mg Oral TID Malvin Johns, MD   10 mg at 03/06/19 1287  . paliperidone (INVEGA SUSTENNA) injection 234 mg  234 mg Intramuscular Q28 days Money, Gerlene Burdock, FNP   234 mg at 02/27/19 1224  . traZODone (DESYREL) tablet 100 mg  100 mg Oral QHS PRN Antonieta Pert, MD   100 mg at 03/04/19 2205   PTA Medications: No medications prior to admission.    Patient Stressors:  Financial difficulties Health problems Other: Living arrangement  Patient Strengths: Ability for insight Communication skills Motivation for treatment/growth  Treatment Modalities: Medication Management, Group therapy, Case management,  1 to 1 session with clinician, Psychoeducation, Recreational therapy.   Physician Treatment Plan for Primary Diagnosis: Schizophrenia (HCC) Long Term Goal(s): Improvement in symptoms so as ready for discharge Improvement in symptoms so as ready for discharge   Short Term Goals: Ability to verbalize feelings will improve Ability to demonstrate self-control will improve Ability to identify and develop effective coping behaviors will improve Compliance with prescribed medications will improve  Medication Management: Evaluate patient's response, side effects, and tolerance of medication regimen.  Therapeutic Interventions: 1 to 1 sessions, Unit Group sessions and Medication administration.  Evaluation of Outcomes: Adequate for Discharge  Physician Treatment Plan for Secondary Diagnosis: Principal Problem:   Schizophrenia (HCC)  Long Term Goal(s): Improvement in symptoms so as ready for discharge Improvement in symptoms so as ready for discharge   Short Term Goals: Ability to verbalize feelings will improve Ability to demonstrate self-control will improve Ability to identify and develop effective coping behaviors will improve Compliance with prescribed medications will improve     Medication Management: Evaluate patient's response, side effects, and tolerance of medication regimen.  Therapeutic Interventions: 1 to 1  sessions, Unit Group sessions and Medication administration.  Evaluation of Outcomes: Adequate for Discharge   RN Treatment Plan for Primary Diagnosis: Schizophrenia (HCC) Long Term Goal(s): Knowledge of disease and therapeutic regimen to maintain health will improve  Short Term Goals: Ability to demonstrate self-control, Ability  to participate in decision making will improve, Ability to identify and develop effective coping behaviors will improve and Compliance with prescribed medications will improve  Medication Management: RN will administer medications as ordered by provider, will assess and evaluate patient's response and provide education to patient for prescribed medication. RN will report any adverse and/or side effects to prescribing provider.  Therapeutic Interventions: 1 on 1 counseling sessions, Psychoeducation, Medication administration, Evaluate responses to treatment, Monitor vital signs and CBGs as ordered, Perform/monitor CIWA, COWS, AIMS and Fall Risk screenings as ordered, Perform wound care treatments as ordered.  Evaluation of Outcomes: Adequate for Discharge   LCSW Treatment Plan for Primary Diagnosis: Schizophrenia [facility](HCC) Long Term Goal(s): Safe transition to appropriate next level of care at discharge, Engage patient in therapeutic group addressing interpersonal concerns.  Short Term Goals: Engage patient in aftercare planning with referrals and resources and Increase skills for wellness and recovery  Therapeutic Interventions: Assess for all discharge needs, 1 to 1 time with Social worker, Explore available resources and support systems, Assess for adequacy in community support network, Educate family and significant other(s) on suicide prevention, Complete Psychosocial Assessment, Interpersonal group therapy.  Evaluation of Outcomes: Adequate for Discharge   Progress in Treatment: Attending groups: Yes. Participating in groups: Yes. Taking medication as prescribed: Yes. Toleration medication: Yes. Family/Significant other contact made: Yes, individual(s) contacted:  pts cousin and payee Patient understands diagnosis: No. Discussing patient identified problems/goals with staff: Yes. Medical problems stabilized or resolved: Yes. Denies suicidal/homicidal ideation: Yes. Issues/concerns per  patient self-inventory: No. Other: NA  New problem(s) identified: No, Describe:  None reported  New Short Term/Long Term Goal(s):  Attend outpatient treatment, take medication as prescribed, develop and implement healthy coping methods Update 02/24/2019:  elimination of symptoms of psychosis, medication management for mood stabilization; elimination of SI thoughts; development of comprehensive mental wellness. Patient Goals:  "Go back to my state"  Discharge Plan or Barriers: Pt will return to his group home and follow up with outpatient treatment. Update 02/18/19: SPE pamphlet, Mobile Crisis information, and AA/NA information provided to patient for additional community support and resources. Pt has an appointment scheduled with [facility] on 10/26 at 2:30. Pt also has been tentatively accepted at [facility].  Update 02/24/2019:  Patient has begun to decline his oral medications since he has been given a injection, due to this the patient has begun to decompensate.  Patient was not admitted to [facility] since there was no guarantee of payment without patient having medicaid. It is possible that with payment patient could go here. Patient states daily different locations that he would like to be discharged to. Update 03/06/2019-Pt is scheduled to discharge today and referred [facility]. Pt referred for outpatient treatment at [facility] in [place].  Reason for Continuation of Hospitalization: D/C on 03/06/2019  Estimated Length of Stay: D/C on 03/06/2019  Attendees: Patient: 03/01/2019   Physician: Mordecai RasmussenJohn Clapacs 03/01/2019   Nursing: Feliz Beamravis Money 03/01/2019   RN Care Manager: 03/01/2019   Social Worker: Lowella Dandyarren Malala Trenkamp [place] Olivia Moton 03/01/2019   Recreational Therapist:  03/01/2019   Other:  03/01/2019   Other:  03/01/2019   Other: 03/01/2019  Scribe for Treatment Team: Yvette Rack,  LCSW 03/06/2019 2:30 PM

## 2019-03-06 NOTE — [facility] Group Notes (Signed)
Balance In Life 03/06/2019 1PM  Type of Therapy/Topic:  Group Therapy:  Balance in Life  Participation Level:  None  Description of Group:   This group will address the concept of balance and how it feels and looks when one is unbalanced. Patients will be encouraged to process areas in their lives that are out of balance and identify reasons for remaining unbalanced. Facilitators will guide patients in utilizing problem-solving interventions to address and correct the stressor making their life unbalanced. Understanding and applying boundaries will be explored and addressed for obtaining and maintaining a balanced life. Patients will be encouraged to explore ways to assertively make their unbalanced needs known to significant others in their lives, using other group members and facilitator for support and feedback.  Therapeutic Goals: 1. Patient will identify two or more emotions or situations they have that consume much of in their lives. 2. Patient will identify signs/triggers that life has become out of balance:  3. Patient will identify two ways to set boundaries in order to achieve balance in their lives:  4. Patient will demonstrate ability to communicate their needs through discussion and/or role plays  Summary of Patient Progress: Pt left group early and did not return.   Therapeutic Modalities:   Cognitive Behavioral Therapy Solution-Focused Therapy Assertiveness Training  Trevor Wilkie Lynelle Smoke, LCSW

## 2019-03-06 NOTE — Discharge Summary (Signed)
Physician Discharge Summary Note  Patient:  Eddie Holland is an 58 y.o., male MRN:  [id number] DOB:  07/22/1960 Patient phone:  [phone] (home)  Patient address:   [address] [place] [address],  Total Time spent with patient: 30 minutes  Date of Admission:  02/12/2019 Date of Discharge: 03/06/19  Reason for Admission:  58 year old man sent to the emergency room from his group home.  Patient is not a very good historian and outside information is limited.  From what I can put together the group home sent him to the emergency room because he was getting belligerent and argumentative and refusing to take medicine.  I do not see anything about any specific assaults.  Patient tells me that the group home was trying to make him take medicine that he does not like specifically Depakote and that he got into an argument with him.  He denies suicidal or homicidal ideation.  I believe he tells me that he has only been at that group home for about a week and prior to that was at [facility] psychiatry ward for 2 months.  I could not find any records related to that in the computer so far.  Most of his history is pressured and rambling and disorganized filled with paranoid content about members of his family.  Hard to know how much of it to believe.  Patient denies alcohol or drug abuse.  He says he has auditory hallucinations which are chronic.  He tells me that the only medicine he wants to take is either Haldol or Ativan.  Principal Problem: Schizophrenia [facility]) Discharge Diagnoses: Principal Problem:   Schizophrenia [facility])   Past Psychiatric History:  Not sure how much of this is reliable but scattered between the other parts of his history I gather that he has been diagnosed with schizophrenia or schizoaffective disorder for a long time.  He has had multiple hospitalizations.  He had previously lived in [place] but at some point move to [place] apparently after his wife passed away.   He tells me that his mother is still living and that she lives in [place] and is his legal guardian.  He has a lot of paranoid and disorganized animosity towards her.  He denies ever having tried to kill himself.  He rattles off multiple names of antipsychotics and mood stabilizers all of which she says have done him wrong and that he does not like.  The only medicine he likes is either Haldol or Ativan.  Past Medical History:  Past Medical History:  Diagnosis Date  . Asthma   . Back pain   . Paranoia [facility])     Past Surgical History:  Procedure Laterality Date  . CHOLECYSTECTOMY     Family History: History reviewed. No pertinent family history. Family Psychiatric  History: None known Social History:  Social History   Substance and Sexual Activity  Alcohol Use No     Social History   Substance and Sexual Activity  Drug Use No    Social History   Socioeconomic History  . Marital status: Married    Spouse name: Not on file  . Number of children: Not on file  . Years of education: Not on file  . Highest education level: Not on file  Occupational History  . Not on file  Social Needs  . Financial resource strain: Patient refused  . Food insecurity    Worry: Patient refused    Inability: Patient refused  .  Transportation needs    Medical: Patient refused    Non-medical: Patient refused  Tobacco Use  . Smoking status: Never Smoker  . Smokeless tobacco: Never Used  Substance and Sexual Activity  . Alcohol use: No  . Drug use: No  . Sexual activity: Not Currently  Lifestyle  . Physical activity    Days per week: Patient refused    Minutes per session: Patient refused  . Stress: Patient refused  Relationships  . Social Musicianconnections    Talks on phone: Patient refused    Gets together: Patient refused    Attends religious service: Patient refused    Active member of club or organization: Patient refused    Attends meetings of clubs or organizations: Patient  refused    Relationship status: Patient refused  Other Topics Concern  . Not on file  Social History Narrative  . Not on file    Hospital Course:  [facility]. The patient stabilized on medication and therapy. Patient was discharged on Haldol Decanoate 200 mg IM Q30 Days, Invega Sustenna 234 mg IM Q28 Days, Trazodone 100 mg PO QHS PRN, Zyprexa Zydis 10 mg PO TID, Depakote DR 500 mg Q12H, and Flonase 50 mcg/act 2 sprays in each nare daily. Patient has shown improvement with improved mood, affect, sleep, appetite, and interaction. Patient has attended group and participated. Patient has been seen in the day room interacting with peers and staff appropriately. Patient denies any SI/HI/AVH and contracts for safety. Patient agrees to follow up at [facility]. Patient is provided with prescriptions for their medications upon discharge.  Upon admission to the unit patient had agreed to start Haldol.  Haldol oral was started with this patient and then was switched to Haldol Decanoate 200 mg IM.  However after the patient started the medication he started refusing the Haldol p.o. stating that pills caused him to have hallucinations.  Patient decompensated and was encouraged daily to continue medications.  After discussion with Dr. Toni Amendlapacs the patient was started on Invega Sustenna 234 mg IM q. 28 days as well as the Haldol Decanoate 200 mg IM every 30 days.  Then patient started becoming more compliant with medications and treatment.  Patient also became less agitated and less argumentative and aggressive with staff.  Feel the patient is in need of multiple antipsychotic medications to maintain stability preferably the LAI's.  Physical Findings: AIMS:  , ,  ,  ,    CIWA:    COWS:     Musculoskeletal: Strength & Muscle Tone: within normal limits Gait & Station: normal Patient leans: N/A  Psychiatric Specialty Exam: Physical Exam  Nursing note and vitals  reviewed. Constitutional: He is oriented to person, place, and time. He appears well-developed and well-nourished.  Cardiovascular: Normal rate.  Respiratory: Effort normal.  Musculoskeletal: Normal range of motion.  Neurological: He is alert and oriented to person, place, and time.  Skin: Skin is warm.    Review of Systems  Constitutional: Negative.   HENT: Negative.   Eyes: Negative.   Respiratory: Negative.   Cardiovascular: Negative.   Gastrointestinal: Negative.   Genitourinary: Negative.   Musculoskeletal: Negative.   Skin: Negative.   Neurological: Negative.   Endo/Heme/Allergies: Negative.   Psychiatric/Behavioral: Negative.     Blood pressure 133/89, pulse 99, temperature 98.7 F (37.1 C), temperature source Oral, resp. rate 18, height 6' (1.829 m), weight 127 kg, SpO2 93 %.Body mass index is 37.97 kg/m.  General Appearance: Casual  Eye Contact::  Good  Speech:  Clear and Coherent409  Volume:  Normal  Mood:  Euthymic  Affect:  Constricted  Thought Process:  Coherent  Orientation:  Full (Time, Place, and Person)  Thought Content:  Logical  Suicidal Thoughts:  No  Homicidal Thoughts:  No  Memory:  Immediate;   Fair Recent;   Fair Remote;   Fair  Judgement:  Fair  Insight:  Fair  Psychomotor Activity:  Normal  Concentration:  Fair  Recall:  Fair  Fund of Knowledge:Fair  Language: Fair  Akathisia:  No  Handed:  Right  AIMS (if indicated):     Assets:  Desire for Improvement Resilience  Sleep:  Number of Hours: 8.25  Cognition: Impaired,  Mild  ADL's:  Intact      Have you used any form of tobacco in the last 30 days? (Cigarettes, Smokeless Tobacco, Cigars, and/or Pipes): No  Has this patient used any form of tobacco in the last 30 days? (Cigarettes, Smokeless Tobacco, Cigars, and/or Pipes) Yes, No  Blood Alcohol level:  Lab Results  Component Value Date   ETH <10 02/12/2019   ETH <10 02/10/2019    Metabolic Disorder Labs:  No results found  for: HGBA1C, MPG No results found for: PROLACTIN Lab Results  Component Value Date   CHOL 137 02/14/2019   TRIG 42 02/14/2019   HDL 56 02/14/2019   CHOLHDL 2.4 02/14/2019   VLDL 8 02/14/2019   LDLCALC 73 02/14/2019    See Psychiatric Specialty Exam and Suicide Risk Assessment completed by Attending Physician prior to discharge.  Discharge destination:  Home  Is patient on multiple antipsychotic therapies at discharge:  Yes,   Do you recommend tapering to monotherapy for antipsychotics?  No   Has Patient had three or more failed trials of antipsychotic monotherapy by history:  No  Recommended Plan for Multiple Antipsychotic Therapies: Additional reason(s) for multiple antispychotic treatment:  Patient needs multiple antipsychotics to stabilize during teh hospital stay and at this time has needed multiple anitipsychotice reach stabilization to discharge   Discharge Instructions    Diet - low sodium heart healthy   Complete by: As directed    Increase activity slowly   Complete by: As directed      Allergies as of 03/06/2019      Reactions   Depakote [valproic Acid] Other (See Comments)   Unknown    Seroquel [quetiapine Fumarate] Other (See Comments)   Back pain       Medication List    TAKE these medications     Indication  divalproex 500 MG DR tablet Commonly known as: DEPAKOTE Take 1 tablet (500 mg total) by mouth every 12 (twelve) hours.  Indication: Schizophrenia   fluticasone 50 MCG/ACT nasal spray Commonly known as: FLONASE Place 2 sprays into both nostrils daily. Start taking on: March 07, 2019  Indication: Signs and Symptoms of Nose Diseases   gabapentin 400 MG capsule Commonly known as: NEURONTIN Take 1 capsule (400 mg total) by mouth 3 (three) times daily.  Indication: Neuropathic Pain   haloperidol decanoate 100 MG/ML injection Commonly known as: HALDOL DECANOATE Inject 2 mLs (200 mg total) into the muscle every 30 (thirty) days. Start taking on:  March 19, 2019  Indication: Schizophrenia   OLANZapine zydis 10 MG disintegrating tablet Commonly known as: ZYPREXA Take 1 tablet (10 mg total) by mouth 3 (three) times daily.  Indication: Schizophrenia   paliperidone 234 MG/1.5ML Susy injection Commonly known as:  INVEGA SUSTENNA Inject 234 mg into the muscle every 28 (twenty-eight) days. Start taking on: March 27, 2019  Indication: Schizophrenia   traZODone 100 MG tablet Commonly known as: DESYREL Take 1 tablet (100 mg total) by mouth at bedtime as needed for sleep.  Indication: Trouble Sleeping      Follow-up Information    [facility], [place] [facility].   Contact information: [place] [address] [phone]           Follow-up recommendations:  Continue activity as tolerated. Continue diet as recommended by your PCP. Ensure to keep all appointments with outpatient providers.  Comments:  Patient is instructed prior to discharge to: Take all medications as prescribed by his/her mental healthcare provider. Report any adverse effects and or reactions from the medicines to his/her outpatient provider promptly. Patient has been instructed & cautioned: To not engage in alcohol and or illegal drug use while on prescription medicines. In the event of worsening symptoms, patient is instructed to call the crisis hotline, 911 and or go to the nearest ED for appropriate evaluation and treatment of symptoms. To follow-up with his/her primary care provider for your other medical issues, concerns and or health care needs.    Signed: [place], FNP 03/06/2019, 9:25 AM

## 2019-03-06 NOTE — Progress Notes (Signed)
Recreation Therapy Notes   Date: 03/06/2019  Time: 9:30 am  Location: Craft room  Behavioral response: Appropriate   Intervention Topic: Decision Making    Discussion/Intervention:  Group content today was focused on Decision making. The group defined decision making and some positive ways they make decisions for themselves. Individuals expressed reasons why they neglected any decision making in the past. Patients described ways to improve decision making skills in the future. The group explained what could happen if they did not do any decision making at all. Participants express how bad decision has affected them and others around them. Individual explained the importance of decision making. The group participated in the intervention "Making decisions" where they had a chance to discover some of their weaknesses and strengths in decision making. Patient came up with a new decision-making skill to improve themselves in the future.  Clinical Observations/Feedback:  Patient came to group and defined decision making as being able to come through in life and being able to come up with a solution. Individual was social with peers and staff while participating in the intervention.    Kristl Morioka LRT/CTRS         Amariss Detamore 03/06/2019 11:23 AM

## 2019-03-06 NOTE — Progress Notes (Signed)
Encourage  Patient to do  ADLs and practice good hygiene and encourage to verbalize positive feeling about self , nurse reinforce interaction with peers , patient is calm no out burst upon approach , patient sleep in his matrass in the bath room, refused any  lucid education. Safety is maintained , denies any SI/HI/AVH , comply with her meds.no distress. 15 minutes rounding is maintained.

## 2019-03-06 NOTE — [facility] Suicide Risk Assessment (Signed)
[facility] Discharge Suicide Risk Assessment   Principal Problem: Schizophrenia [facility]) Discharge Diagnoses: Principal Problem:   Schizophrenia ([place])   Total Time spent with patient: 30 minutes  Musculoskeletal: Strength & Muscle Tone: within normal limits Gait & Station: normal Patient leans: N/A  Psychiatric Specialty Exam: Review of Systems  Constitutional: Negative.   HENT: Negative.   Eyes: Negative.   Respiratory: Negative.   Cardiovascular: Negative.   Gastrointestinal: Negative.   Musculoskeletal: Negative.   Skin: Negative.   Neurological: Negative.   Psychiatric/Behavioral: Negative.  Negative for depression.    Blood pressure 133/89, pulse 99, temperature 98.7 F (37.1 C), temperature source Oral, resp. rate 18, height 6' (1.829 m), weight 127 kg, SpO2 93 %.Body mass index is 37.97 kg/m.  General Appearance: Casual  Eye Contact::  Good  Speech:  Clear and Coherent409  Volume:  Normal  Mood:  Euthymic  Affect:  Constricted  Thought Process:  Coherent  Orientation:  Full (Time, Place, and Person)  Thought Content:  Logical  Suicidal Thoughts:  No  Homicidal Thoughts:  No  Memory:  Immediate;   Fair Recent;   Fair Remote;   Fair  Judgement:  Fair  Insight:  Fair  Psychomotor Activity:  Normal  Concentration:  Fair  Recall:  [employer] [place]  Language: Fair  Akathisia:  No  Handed:  Right  AIMS (if indicated):     Assets:  Desire for Improvement Resilience  Sleep:  Number of Hours: 8.25  Cognition: Impaired,  Mild  ADL's:  Intact   Mental Status Per Nursing Assessment::   On Admission:  NA  Demographic Factors:  Male, Low socioeconomic status, Living alone and Unemployed  Loss Factors: Financial problems/change in socioeconomic status  Historical Factors: Impulsivity  Risk Reduction Factors:   Religious beliefs about death  Continued Clinical Symptoms:  Schizophrenia:   Paranoid or undifferentiated type  Cognitive Features That  Contribute To Risk:  None    Suicide Risk:  Minimal: No identifiable suicidal ideation.  Patients presenting with no risk factors but with morbid ruminations; may be classified as minimal risk based on the severity of the depressive symptoms  Follow-up [facility] Follow up.   Why: You must walk in any day Monday-Friday at 8:30 AM to begin outpatient medication management and therapy services. Thank You! Contact information: [address] [place] [address] Phone: [phone] Fax: [phone]          Plan Of Care/Follow-up recommendations:  Activity:  Activity as tolerated Diet:  Regular diet Other:  Follow-up outpatient treatment as recommended in [place].  Alethia Berthold, MD 03/06/2019, 12:38 PM

## 2019-03-06 NOTE — Progress Notes (Signed)
Supervisor spoke to [employer], Development worker, community.  She is again trying to get update on pt medicaid being transferred to [facility].  Since pt has income (disability) he does not qualify for regular medicaid.  His medicaid amount is only enough to pay for his medicare premium.  Supervisor asked about pt disability money reported to be cut off as payee/former payee received a notice about some sort of reauthorization.  Suanne Marker reports this is not something [employer] could assist with.  Pt or pt payee will need to contact social security directly and do the reauthorization process. Winferd Humphrey, MSW, LCSW Advanced Care Supervisor 03/06/2019 11:16 AM

## 2019-03-06 NOTE — Plan of Care (Signed)
  Problem: Education: Goal: Knowledge of [place] [facility] Education information/materials will improve Outcome: Progressing Goal: Emotional status will improve Outcome: Progressing Goal: Mental status will improve Outcome: Progressing Goal: Verbalization of understanding the information provided will improve Outcome: Progressing   Problem: Activity: Goal: Interest or engagement in activities will improve Outcome: Progressing Goal: Sleeping patterns will improve Outcome: Progressing   Problem: Coping: Goal: Ability to verbalize frustrations and anger appropriately will improve Outcome: Progressing Goal: Ability to demonstrate self-control will improve Outcome: Progressing   Problem: Health Behavior/Discharge Planning: Goal: Identification of resources available to assist in meeting health care needs will improve Outcome: Progressing Goal: Compliance with treatment plan for underlying cause of condition will improve Outcome: Progressing   Problem: Physical Regulation: Goal: Ability to maintain clinical measurements within normal limits will improve Outcome: Progressing   Problem: Safety: Goal: Periods of time without injury will increase Outcome: Progressing   

## 2019-03-06 NOTE — Progress Notes (Signed)
Recreation Therapy Notes  INPATIENT RECREATION TR PLAN  Patient Details Name: Eddie Holland MRN: [id number] DOB: 04-27-1961 Today's Date: 03/06/2019  Rec Therapy Plan Is patient appropriate for Therapeutic Recreation?: Yes Treatment times per week: at least 3 Estimated Length of Stay: 5-7 days TR Treatment/Interventions: Group participation (Comment)  Discharge Criteria Pt will be discharged from therapy if:: Discharged Treatment plan/goals/alternatives discussed and agreed upon by:: Patient/family  Discharge Summary Short term goals set: Patient will engage in groups without prompting or encouragement from LRT x3 group sessions within 5 recreation therapy group sessions Short term goals met: Complete Progress toward goals comments: Groups attended Which groups?: Self-esteem, Goal setting, Communication, Anger management, Leisure education, Other (Comment)(Decision making, Relaxation, Emotions, Problem Solving) Reason goals not met: N/A Therapeutic equipment acquired: N/A Reason patient discharged from therapy: Discharge from hospital Pt/family agrees with progress & goals achieved: Yes Date patient discharged from therapy: 03/06/19   Adonnis Salceda 03/06/2019, 11:32 AM

## 2019-03-06 NOTE — Progress Notes (Signed)
Pt denies SI, HI and AVH. Pt was educated on dc plan and verbalizes understanding. Pt received belongings, medications, prescriptions and dc packet. Pt was discharged to the taxi to [employer]. Collier Bullock RN

## 2019-03-06 NOTE — Plan of Care (Signed)
  Problem: Group Participation Goal: STG - Patient will engage in groups without prompting or encouragement from LRT x3 group sessions within 5 recreation therapy group sessions Description: STG - Patient will engage in groups without prompting or encouragement from LRT x3 group sessions within 5 recreation therapy group sessions Outcome: Completed/Met
